# Patient Record
Sex: Female | Born: 1945 | Race: White | Hispanic: No | Smoking: Former smoker
Health system: Southern US, Community
[De-identification: ages and names within clinical notes are randomized; demographics above are authoritative.]

## PROBLEM LIST (undated history)

## (undated) DIAGNOSIS — F329 Major depressive disorder, single episode, unspecified: Secondary | ICD-10-CM

## (undated) DIAGNOSIS — D693 Immune thrombocytopenic purpura: Secondary | ICD-10-CM

## (undated) DIAGNOSIS — N200 Calculus of kidney: Secondary | ICD-10-CM

## (undated) DIAGNOSIS — F32A Depression, unspecified: Secondary | ICD-10-CM

## (undated) DIAGNOSIS — K219 Gastro-esophageal reflux disease without esophagitis: Secondary | ICD-10-CM

## (undated) DIAGNOSIS — J449 Chronic obstructive pulmonary disease, unspecified: Secondary | ICD-10-CM

## (undated) DIAGNOSIS — E119 Type 2 diabetes mellitus without complications: Secondary | ICD-10-CM

## (undated) DIAGNOSIS — E039 Hypothyroidism, unspecified: Secondary | ICD-10-CM

## (undated) DIAGNOSIS — K7460 Unspecified cirrhosis of liver: Secondary | ICD-10-CM

## (undated) HISTORY — PX: CATARACT EXTRACTION: SUR2

## (undated) HISTORY — PX: LUMBAR DISC SURGERY: SHX700

## (undated) HISTORY — DX: Type 2 diabetes mellitus without complications: E11.9

## (undated) HISTORY — DX: Major depressive disorder, single episode, unspecified: F32.9

## (undated) HISTORY — DX: Calculus of kidney: N20.0

## (undated) HISTORY — PX: TONSILLECTOMY AND ADENOIDECTOMY: SUR1326

## (undated) HISTORY — DX: Unspecified cirrhosis of liver: K74.60

## (undated) HISTORY — DX: Hypothyroidism, unspecified: E03.9

## (undated) HISTORY — DX: Depression, unspecified: F32.A

## (undated) HISTORY — DX: Chronic obstructive pulmonary disease, unspecified: J44.9

## (undated) HISTORY — DX: Immune thrombocytopenic purpura: D69.3

## (undated) HISTORY — DX: Gastro-esophageal reflux disease without esophagitis: K21.9

---

## 2006-04-10 ENCOUNTER — Ambulatory Visit: Payer: Self-pay | Admitting: Internal Medicine

## 2008-01-03 ENCOUNTER — Ambulatory Visit: Payer: Self-pay | Admitting: Internal Medicine

## 2009-01-22 ENCOUNTER — Ambulatory Visit: Payer: Self-pay | Admitting: Internal Medicine

## 2009-08-02 ENCOUNTER — Emergency Department: Payer: Self-pay | Admitting: Unknown Physician Specialty

## 2009-08-05 ENCOUNTER — Inpatient Hospital Stay: Payer: Self-pay | Admitting: Internal Medicine

## 2010-02-18 ENCOUNTER — Ambulatory Visit: Payer: Self-pay | Admitting: Internal Medicine

## 2011-03-10 ENCOUNTER — Ambulatory Visit: Payer: Self-pay | Admitting: Internal Medicine

## 2011-03-22 ENCOUNTER — Encounter: Payer: Self-pay | Admitting: Specialist

## 2011-04-02 ENCOUNTER — Encounter: Payer: Self-pay | Admitting: Specialist

## 2011-05-02 ENCOUNTER — Encounter: Payer: Self-pay | Admitting: Specialist

## 2011-06-02 ENCOUNTER — Encounter: Payer: Self-pay | Admitting: Specialist

## 2011-07-02 ENCOUNTER — Encounter: Payer: Self-pay | Admitting: Specialist

## 2012-06-06 ENCOUNTER — Ambulatory Visit: Payer: Self-pay | Admitting: Internal Medicine

## 2013-12-24 DIAGNOSIS — E785 Hyperlipidemia, unspecified: Secondary | ICD-10-CM | POA: Insufficient documentation

## 2013-12-24 DIAGNOSIS — E039 Hypothyroidism, unspecified: Secondary | ICD-10-CM | POA: Insufficient documentation

## 2013-12-24 DIAGNOSIS — J432 Centrilobular emphysema: Secondary | ICD-10-CM | POA: Insufficient documentation

## 2013-12-24 DIAGNOSIS — IMO0001 Reserved for inherently not codable concepts without codable children: Secondary | ICD-10-CM | POA: Insufficient documentation

## 2013-12-24 DIAGNOSIS — E1165 Type 2 diabetes mellitus with hyperglycemia: Secondary | ICD-10-CM

## 2014-05-22 ENCOUNTER — Inpatient Hospital Stay: Payer: Self-pay | Admitting: Internal Medicine

## 2014-05-22 LAB — COMPREHENSIVE METABOLIC PANEL
ALT: 20 U/L
Albumin: 3.1 g/dL — ABNORMAL LOW (ref 3.4–5.0)
Alkaline Phosphatase: 80 U/L
Anion Gap: 2 — ABNORMAL LOW (ref 7–16)
BUN: 25 mg/dL — AB (ref 7–18)
Bilirubin,Total: 0.7 mg/dL (ref 0.2–1.0)
CALCIUM: 8.6 mg/dL (ref 8.5–10.1)
CHLORIDE: 105 mmol/L (ref 98–107)
CO2: 30 mmol/L (ref 21–32)
Creatinine: 0.66 mg/dL (ref 0.60–1.30)
GLUCOSE: 258 mg/dL — AB (ref 65–99)
Osmolality: 287 (ref 275–301)
POTASSIUM: 4.4 mmol/L (ref 3.5–5.1)
SGOT(AST): 25 U/L (ref 15–37)
SODIUM: 137 mmol/L (ref 136–145)
TOTAL PROTEIN: 6.4 g/dL (ref 6.4–8.2)

## 2014-05-22 LAB — CBC WITH DIFFERENTIAL/PLATELET
BASOS PCT: 0.8 %
Basophil #: 0.1 10*3/uL (ref 0.0–0.1)
EOS PCT: 1.6 %
Eosinophil #: 0.2 10*3/uL (ref 0.0–0.7)
HCT: 35.6 % (ref 35.0–47.0)
HGB: 10.8 g/dL — ABNORMAL LOW (ref 12.0–16.0)
Lymphocyte #: 0.9 10*3/uL — ABNORMAL LOW (ref 1.0–3.6)
Lymphocyte %: 7.4 %
MCH: 23 pg — AB (ref 26.0–34.0)
MCHC: 30.3 g/dL — ABNORMAL LOW (ref 32.0–36.0)
MCV: 76 fL — AB (ref 80–100)
MONO ABS: 0.5 x10 3/mm (ref 0.2–0.9)
MONOS PCT: 4.6 %
Neutrophil #: 10.3 10*3/uL — ABNORMAL HIGH (ref 1.4–6.5)
Neutrophil %: 85.6 %
Platelet: 103 10*3/uL — ABNORMAL LOW (ref 150–440)
RBC: 4.69 10*6/uL (ref 3.80–5.20)
RDW: 17.5 % — ABNORMAL HIGH (ref 11.5–14.5)
WBC: 12 10*3/uL — ABNORMAL HIGH (ref 3.6–11.0)

## 2014-05-22 LAB — APTT: Activated PTT: 26.5 secs (ref 23.6–35.9)

## 2014-05-22 LAB — HEMOGLOBIN: HGB: 9.4 g/dL — ABNORMAL LOW (ref 12.0–16.0)

## 2014-05-22 LAB — PROTIME-INR
INR: 1.3
Prothrombin Time: 15.6 secs — ABNORMAL HIGH (ref 11.5–14.7)

## 2014-05-23 LAB — BASIC METABOLIC PANEL
Anion Gap: 7 (ref 7–16)
BUN: 21 mg/dL — AB (ref 7–18)
CREATININE: 0.68 mg/dL (ref 0.60–1.30)
Calcium, Total: 8 mg/dL — ABNORMAL LOW (ref 8.5–10.1)
Chloride: 111 mmol/L — ABNORMAL HIGH (ref 98–107)
Co2: 29 mmol/L (ref 21–32)
EGFR (African American): >60
GLUCOSE: 107 mg/dL — AB (ref 65–99)
Osmolality: 296 (ref 275–301)
POTASSIUM: 4 mmol/L (ref 3.5–5.1)
Sodium: 147 mmol/L — ABNORMAL HIGH (ref 136–145)

## 2014-05-23 LAB — CBC WITH DIFFERENTIAL/PLATELET
BASOS ABS: 0 10*3/uL (ref 0.0–0.1)
Basophil %: 0.5 %
Eosinophil #: 0.1 10*3/uL (ref 0.0–0.7)
Eosinophil %: 2.5 %
HCT: 27 % — ABNORMAL LOW (ref 35.0–47.0)
HGB: 8.3 g/dL — ABNORMAL LOW (ref 12.0–16.0)
LYMPHS ABS: 1 10*3/uL (ref 1.0–3.6)
Lymphocyte %: 20.9 %
MCH: 22.9 pg — ABNORMAL LOW (ref 26.0–34.0)
MCHC: 30.7 g/dL — ABNORMAL LOW (ref 32.0–36.0)
MCV: 75 fL — ABNORMAL LOW (ref 80–100)
MONOS PCT: 8 %
Monocyte #: 0.4 x10 3/mm (ref 0.2–0.9)
NEUTROS ABS: 3.2 10*3/uL (ref 1.4–6.5)
Neutrophil %: 68.1 %
Platelet: 56 10*3/uL — ABNORMAL LOW (ref 150–440)
RBC: 3.62 10*6/uL — AB (ref 3.80–5.20)
RDW: 17.2 % — ABNORMAL HIGH (ref 11.5–14.5)
WBC: 4.7 10*3/uL (ref 3.6–11.0)

## 2014-05-23 LAB — HEMOGLOBIN
HGB: 7.9 g/dL — AB (ref 12.0–16.0)
HGB: 8.7 g/dL — ABNORMAL LOW (ref 12.0–16.0)

## 2014-05-24 LAB — HEMOGLOBIN
HGB: 7.5 g/dL — ABNORMAL LOW (ref 12.0–16.0)
HGB: 8.4 g/dL — ABNORMAL LOW (ref 12.0–16.0)

## 2014-05-25 LAB — HEMOGLOBIN: HGB: 7.3 g/dL — ABNORMAL LOW (ref 12.0–16.0)

## 2014-05-26 LAB — CREATININE, SERUM
Creatinine: 0.84 mg/dL (ref 0.60–1.30)
EGFR (African American): >60

## 2014-05-26 LAB — AFP TUMOR MARKER: AFP-Tumor Marker: 3.4 ng/mL (ref 0.0–8.3)

## 2014-05-26 LAB — HEMOGLOBIN: HGB: 8.5 g/dL — ABNORMAL LOW (ref 12.0–16.0)

## 2014-05-28 ENCOUNTER — Inpatient Hospital Stay: Payer: Self-pay | Admitting: Internal Medicine

## 2014-05-28 LAB — CBC
HCT: 33.2 % — ABNORMAL LOW (ref 35.0–47.0)
HGB: 10.2 g/dL — ABNORMAL LOW (ref 12.0–16.0)
MCH: 24 pg — AB (ref 26.0–34.0)
MCHC: 30.8 g/dL — AB (ref 32.0–36.0)
MCV: 78 fL — ABNORMAL LOW (ref 80–100)
PLATELETS: 127 10*3/uL — AB (ref 150–440)
RBC: 4.26 10*6/uL (ref 3.80–5.20)
RDW: 19 % — ABNORMAL HIGH (ref 11.5–14.5)
WBC: 15.2 10*3/uL — ABNORMAL HIGH (ref 3.6–11.0)

## 2014-05-28 LAB — BASIC METABOLIC PANEL
Anion Gap: 10 (ref 7–16)
BUN: 20 mg/dL — AB (ref 7–18)
CALCIUM: 8.4 mg/dL — AB (ref 8.5–10.1)
Chloride: 102 mmol/L (ref 98–107)
Co2: 29 mmol/L (ref 21–32)
Creatinine: 0.81 mg/dL (ref 0.60–1.30)
EGFR (African American): >60
EGFR (Non-African Amer.): >60
Glucose: 210 mg/dL — ABNORMAL HIGH (ref 65–99)
Osmolality: 290 (ref 275–301)
POTASSIUM: 4.2 mmol/L (ref 3.5–5.1)
Sodium: 141 mmol/L (ref 136–145)

## 2014-05-28 LAB — TROPONIN I: Troponin-I: <0.02 ng/mL

## 2014-05-29 LAB — CBC WITH DIFFERENTIAL/PLATELET
Basophil #: 0 10*3/uL (ref 0.0–0.1)
Basophil %: 0 %
EOS PCT: 0 %
Eosinophil #: 0 10*3/uL (ref 0.0–0.7)
HCT: 28.2 % — AB (ref 35.0–47.0)
HGB: 8.7 g/dL — ABNORMAL LOW (ref 12.0–16.0)
LYMPHS PCT: 6.2 %
Lymphocyte #: 0.2 10*3/uL — ABNORMAL LOW (ref 1.0–3.6)
MCH: 24.6 pg — ABNORMAL LOW (ref 26.0–34.0)
MCHC: 30.9 g/dL — ABNORMAL LOW (ref 32.0–36.0)
MCV: 80 fL (ref 80–100)
Monocyte #: 0.1 x10 3/mm — ABNORMAL LOW (ref 0.2–0.9)
Monocyte %: 3.1 %
Neutrophil #: 2.8 10*3/uL (ref 1.4–6.5)
Neutrophil %: 90.7 %
Platelet: 57 10*3/uL — ABNORMAL LOW (ref 150–440)
RBC: 3.54 10*6/uL — AB (ref 3.80–5.20)
RDW: 18.9 % — AB (ref 11.5–14.5)
WBC: 3.1 10*3/uL — AB (ref 3.6–11.0)

## 2014-05-29 LAB — BASIC METABOLIC PANEL
Anion Gap: 7 (ref 7–16)
BUN: 23 mg/dL — ABNORMAL HIGH (ref 7–18)
Calcium, Total: 8.2 mg/dL — ABNORMAL LOW (ref 8.5–10.1)
Chloride: 99 mmol/L (ref 98–107)
Co2: 33 mmol/L — ABNORMAL HIGH (ref 21–32)
Creatinine: 0.87 mg/dL (ref 0.60–1.30)
EGFR (African American): >60
EGFR (Non-African Amer.): >60
GLUCOSE: 332 mg/dL — AB (ref 65–99)
OSMOLALITY: 294 (ref 275–301)
Potassium: 4.4 mmol/L (ref 3.5–5.1)
Sodium: 139 mmol/L (ref 136–145)

## 2014-05-30 LAB — CBC WITH DIFFERENTIAL/PLATELET
BASOS ABS: 0 10*3/uL (ref 0.0–0.1)
Basophil %: 0.2 %
EOS ABS: 0 10*3/uL (ref 0.0–0.7)
EOS PCT: 0.1 %
HCT: 27.6 % — AB (ref 35.0–47.0)
HGB: 8.8 g/dL — ABNORMAL LOW (ref 12.0–16.0)
LYMPHS ABS: 0.2 10*3/uL — AB (ref 1.0–3.6)
LYMPHS PCT: 5.5 %
MCH: 24.6 pg — ABNORMAL LOW (ref 26.0–34.0)
MCHC: 31.9 g/dL — ABNORMAL LOW (ref 32.0–36.0)
MCV: 77 fL — ABNORMAL LOW (ref 80–100)
MONOS PCT: 3.8 %
Monocyte #: 0.1 x10 3/mm — ABNORMAL LOW (ref 0.2–0.9)
Neutrophil #: 2.9 10*3/uL (ref 1.4–6.5)
Neutrophil %: 90.4 %
PLATELETS: 57 10*3/uL — AB (ref 150–440)
RBC: 3.58 10*6/uL — AB (ref 3.80–5.20)
RDW: 19.3 % — ABNORMAL HIGH (ref 11.5–14.5)
WBC: 3.3 10*3/uL — ABNORMAL LOW (ref 3.6–11.0)

## 2014-06-01 ENCOUNTER — Ambulatory Visit: Payer: Self-pay | Admitting: Oncology

## 2014-06-06 DIAGNOSIS — I8511 Secondary esophageal varices with bleeding: Secondary | ICD-10-CM | POA: Insufficient documentation

## 2014-06-16 ENCOUNTER — Observation Stay: Payer: Self-pay | Admitting: Internal Medicine

## 2014-06-16 LAB — COMPREHENSIVE METABOLIC PANEL
ALK PHOS: 77 U/L
ALT: 34 U/L
ANION GAP: 5 — AB (ref 7–16)
Albumin: 2.9 g/dL — ABNORMAL LOW (ref 3.4–5.0)
BUN: 7 mg/dL (ref 7–18)
Bilirubin,Total: 0.9 mg/dL (ref 0.2–1.0)
CALCIUM: 8.4 mg/dL — AB (ref 8.5–10.1)
CREATININE: 0.75 mg/dL (ref 0.60–1.30)
Chloride: 99 mmol/L (ref 98–107)
Co2: 39 mmol/L — ABNORMAL HIGH (ref 21–32)
EGFR (African American): >60
GLUCOSE: 272 mg/dL — AB (ref 65–99)
Osmolality: 293 (ref 275–301)
POTASSIUM: 3.7 mmol/L (ref 3.5–5.1)
SGOT(AST): 27 U/L (ref 15–37)
Sodium: 143 mmol/L (ref 136–145)
TOTAL PROTEIN: 5.7 g/dL — AB (ref 6.4–8.2)

## 2014-06-16 LAB — CBC WITH DIFFERENTIAL/PLATELET
Basophil #: 0 10*3/uL (ref 0.0–0.1)
Basophil %: 0.7 %
EOS PCT: 2.4 %
Eosinophil #: 0.1 10*3/uL (ref 0.0–0.7)
HCT: 30.3 % — AB (ref 35.0–47.0)
HGB: 9.4 g/dL — AB (ref 12.0–16.0)
LYMPHS ABS: 0.4 10*3/uL — AB (ref 1.0–3.6)
Lymphocyte %: 13.5 %
MCH: 24.8 pg — ABNORMAL LOW (ref 26.0–34.0)
MCHC: 31.1 g/dL — AB (ref 32.0–36.0)
MCV: 80 fL (ref 80–100)
MONO ABS: 0.2 x10 3/mm (ref 0.2–0.9)
MONOS PCT: 8.8 %
Neutrophil #: 2 10*3/uL (ref 1.4–6.5)
Neutrophil %: 74.6 %
Platelet: 31 10*3/uL — ABNORMAL LOW (ref 150–440)
RBC: 3.81 10*6/uL (ref 3.80–5.20)
RDW: 20 % — AB (ref 11.5–14.5)
WBC: 2.6 10*3/uL — AB (ref 3.6–11.0)

## 2014-06-16 LAB — CBC
HCT: 33.2 % — ABNORMAL LOW (ref 35.0–47.0)
HGB: 10.2 g/dL — AB (ref 12.0–16.0)
MCH: 24.8 pg — AB (ref 26.0–34.0)
MCHC: 30.7 g/dL — AB (ref 32.0–36.0)
MCV: 81 fL (ref 80–100)
Platelet: 44 10*3/uL — ABNORMAL LOW (ref 150–440)
RBC: 4.11 10*6/uL (ref 3.80–5.20)
RDW: 20.5 % — ABNORMAL HIGH (ref 11.5–14.5)
WBC: 4.8 10*3/uL (ref 3.6–11.0)

## 2014-06-16 LAB — PROTIME-INR
INR: 1.2
PROTHROMBIN TIME: 14.9 s — AB (ref 11.5–14.7)

## 2014-06-17 LAB — CBC WITH DIFFERENTIAL/PLATELET
BASOS ABS: 0 10*3/uL (ref 0.0–0.1)
Basophil %: 0.8 %
EOS ABS: 0.1 10*3/uL (ref 0.0–0.7)
Eosinophil %: 2.3 %
HCT: 30.2 % — ABNORMAL LOW (ref 35.0–47.0)
HGB: 9.2 g/dL — ABNORMAL LOW (ref 12.0–16.0)
LYMPHS PCT: 12.1 %
Lymphocyte #: 0.4 10*3/uL — ABNORMAL LOW (ref 1.0–3.6)
MCH: 24.6 pg — ABNORMAL LOW (ref 26.0–34.0)
MCHC: 30.4 g/dL — ABNORMAL LOW (ref 32.0–36.0)
MCV: 81 fL (ref 80–100)
Monocyte #: 0.3 x10 3/mm (ref 0.2–0.9)
Monocyte %: 7.9 %
NEUTROS PCT: 76.9 %
Neutrophil #: 2.5 10*3/uL (ref 1.4–6.5)
Platelet: 34 10*3/uL — ABNORMAL LOW (ref 150–440)
RBC: 3.73 10*6/uL — ABNORMAL LOW (ref 3.80–5.20)
RDW: 20 % — AB (ref 11.5–14.5)
WBC: 3.2 10*3/uL — ABNORMAL LOW (ref 3.6–11.0)

## 2014-06-17 LAB — BASIC METABOLIC PANEL
ANION GAP: 3 — AB (ref 7–16)
BUN: 7 mg/dL (ref 7–18)
Calcium, Total: 7.8 mg/dL — ABNORMAL LOW (ref 8.5–10.1)
Chloride: 102 mmol/L (ref 98–107)
Co2: 39 mmol/L — ABNORMAL HIGH (ref 21–32)
Creatinine: 0.74 mg/dL (ref 0.60–1.30)
EGFR (African American): >60
Glucose: 144 mg/dL — ABNORMAL HIGH (ref 65–99)
Osmolality: 287 (ref 275–301)
Potassium: 4.1 mmol/L (ref 3.5–5.1)
Sodium: 144 mmol/L (ref 136–145)

## 2014-06-25 ENCOUNTER — Inpatient Hospital Stay: Payer: Self-pay | Admitting: Internal Medicine

## 2014-06-25 LAB — CBC
HCT: 35.2 % (ref 35.0–47.0)
HGB: 10.5 g/dL — ABNORMAL LOW (ref 12.0–16.0)
MCH: 24.6 pg — AB (ref 26.0–34.0)
MCHC: 29.9 g/dL — AB (ref 32.0–36.0)
MCV: 82 fL (ref 80–100)
PLATELETS: 85 10*3/uL — AB (ref 150–440)
RBC: 4.29 10*6/uL (ref 3.80–5.20)
RDW: 19.7 % — AB (ref 11.5–14.5)
WBC: 2.9 10*3/uL — ABNORMAL LOW (ref 3.6–11.0)

## 2014-06-25 LAB — BASIC METABOLIC PANEL
Anion Gap: 6 — ABNORMAL LOW (ref 7–16)
BUN: 11 mg/dL (ref 4–21)
BUN: 11 mg/dL (ref 7–18)
CO2: 37 mmol/L — AB (ref 21–32)
Calcium, Total: 8.3 mg/dL — ABNORMAL LOW (ref 8.5–10.1)
Chloride: 100 mmol/L (ref 98–107)
Creatinine: 0.56 mg/dL — ABNORMAL LOW (ref 0.60–1.30)
Creatinine: 0.6 mg/dL (ref 0.5–1.1)
EGFR (Non-African Amer.): >60
Glucose: 258 mg/dL
Glucose: 258 mg/dL — ABNORMAL HIGH (ref 65–99)
Osmolality: 293 (ref 275–301)
Potassium: 4.4 mmol/L (ref 3.4–5.3)
Potassium: 4.4 mmol/L (ref 3.5–5.1)
SODIUM: 143 mmol/L (ref 137–147)
Sodium: 143 mmol/L (ref 136–145)

## 2014-06-25 LAB — TROPONIN I: Troponin-I: <0.02 ng/mL

## 2014-06-25 LAB — CBC AND DIFFERENTIAL
HCT: 35 % — AB (ref 36–46)
HEMOGLOBIN: 10.5 g/dL — AB (ref 12.0–16.0)
Platelets: 85 10*3/uL — AB (ref 150–399)
WBC: 2.9 10*3/mL

## 2014-06-26 LAB — BASIC METABOLIC PANEL
Anion Gap: 3 — ABNORMAL LOW (ref 7–16)
BUN: 12 mg/dL (ref 7–18)
CALCIUM: 8.3 mg/dL — AB (ref 8.5–10.1)
Chloride: 99 mmol/L (ref 98–107)
Co2: 42 mmol/L (ref 21–32)
Creatinine: 0.69 mg/dL (ref 0.60–1.30)
GLUCOSE: 246 mg/dL — AB (ref 65–99)
Osmolality: 295 (ref 275–301)
Potassium: 4 mmol/L (ref 3.5–5.1)
Sodium: 144 mmol/L (ref 136–145)

## 2014-06-26 LAB — CBC WITH DIFFERENTIAL/PLATELET
Basophil #: 0 10*3/uL (ref 0.0–0.1)
Basophil %: 0.2 %
EOS ABS: 0 10*3/uL (ref 0.0–0.7)
EOS PCT: 0.2 %
HCT: 32.5 % — AB (ref 35.0–47.0)
HGB: 9.9 g/dL — ABNORMAL LOW (ref 12.0–16.0)
Lymphocyte #: 0.2 10*3/uL — ABNORMAL LOW (ref 1.0–3.6)
Lymphocyte %: 11.4 %
MCH: 24.6 pg — ABNORMAL LOW (ref 26.0–34.0)
MCHC: 30.5 g/dL — AB (ref 32.0–36.0)
MCV: 81 fL (ref 80–100)
Monocyte #: 0.1 x10 3/mm — ABNORMAL LOW (ref 0.2–0.9)
Monocyte %: 6.1 %
NEUTROS ABS: 1.2 10*3/uL — AB (ref 1.4–6.5)
Neutrophil %: 82.1 %
Platelet: 59 10*3/uL — ABNORMAL LOW (ref 150–440)
RBC: 4.04 10*6/uL (ref 3.80–5.20)
RDW: 19.3 % — ABNORMAL HIGH (ref 11.5–14.5)
WBC: 1.4 10*3/uL — AB (ref 3.6–11.0)

## 2014-06-26 LAB — IRON AND TIBC
IRON BIND. CAP.(TOTAL): 294 ug/dL (ref 250–450)
IRON: 17 ug/dL — AB (ref 50–170)
Iron Saturation: 6 %
UNBOUND IRON-BIND. CAP.: 277 ug/dL

## 2014-06-26 LAB — FERRITIN: Ferritin (ARMC): 33 ng/mL (ref 8–388)

## 2014-06-26 LAB — FOLATE: Folic Acid: 8.3 ng/mL (ref 3.1–100.0)

## 2014-06-26 LAB — LACTATE DEHYDROGENASE: LDH: 231 U/L (ref 81–246)

## 2014-06-27 LAB — CBC WITH DIFFERENTIAL/PLATELET
BASOS PCT: 0.1 %
Basophil #: 0 10*3/uL (ref 0.0–0.1)
Eosinophil #: 0 10*3/uL (ref 0.0–0.7)
Eosinophil %: 0.1 %
HCT: 32.5 % — AB (ref 35.0–47.0)
HGB: 10 g/dL — AB (ref 12.0–16.0)
LYMPHS ABS: 0.2 10*3/uL — AB (ref 1.0–3.6)
Lymphocyte %: 5.6 %
MCH: 24.6 pg — ABNORMAL LOW (ref 26.0–34.0)
MCHC: 30.7 g/dL — ABNORMAL LOW (ref 32.0–36.0)
MCV: 80 fL (ref 80–100)
MONO ABS: 0.2 x10 3/mm (ref 0.2–0.9)
Monocyte %: 5.6 %
NEUTROS PCT: 88.6 %
Neutrophil #: 2.7 10*3/uL (ref 1.4–6.5)
Platelet: 65 10*3/uL — ABNORMAL LOW (ref 150–440)
RBC: 4.06 10*6/uL (ref 3.80–5.20)
RDW: 19.4 % — ABNORMAL HIGH (ref 11.5–14.5)
WBC: 3 10*3/uL — AB (ref 3.6–11.0)

## 2014-06-28 LAB — CBC WITH DIFFERENTIAL/PLATELET
Basophil #: 0 10*3/uL (ref 0.0–0.1)
Basophil %: 0.1 %
EOS PCT: 0 %
Eosinophil #: 0 10*3/uL (ref 0.0–0.7)
HCT: 32.4 % — ABNORMAL LOW (ref 35.0–47.0)
HGB: 9.9 g/dL — AB (ref 12.0–16.0)
Lymphocyte #: 0.1 10*3/uL — ABNORMAL LOW (ref 1.0–3.6)
Lymphocyte %: 4.9 %
MCH: 24.4 pg — AB (ref 26.0–34.0)
MCHC: 30.5 g/dL — AB (ref 32.0–36.0)
MCV: 80 fL (ref 80–100)
MONO ABS: 0.2 x10 3/mm (ref 0.2–0.9)
MONOS PCT: 6.4 %
NEUTROS ABS: 2.4 10*3/uL (ref 1.4–6.5)
NEUTROS PCT: 88.6 %
Platelet: 64 10*3/uL — ABNORMAL LOW (ref 150–440)
RBC: 4.04 10*6/uL (ref 3.80–5.20)
RDW: 19.3 % — ABNORMAL HIGH (ref 11.5–14.5)
WBC: 2.7 10*3/uL — AB (ref 3.6–11.0)

## 2014-06-29 DIAGNOSIS — I361 Nonrheumatic tricuspid (valve) insufficiency: Secondary | ICD-10-CM

## 2014-06-29 LAB — CBC WITH DIFFERENTIAL/PLATELET
BASOS ABS: 0 10*3/uL (ref 0.0–0.1)
BASOS PCT: 0.1 %
EOS ABS: 0 10*3/uL (ref 0.0–0.7)
Eosinophil %: 0 %
HCT: 32 % — ABNORMAL LOW (ref 35.0–47.0)
HGB: 9.9 g/dL — ABNORMAL LOW (ref 12.0–16.0)
LYMPHS PCT: 3.7 %
Lymphocyte #: 0.1 10*3/uL — ABNORMAL LOW (ref 1.0–3.6)
MCH: 24.7 pg — ABNORMAL LOW (ref 26.0–34.0)
MCHC: 31 g/dL — ABNORMAL LOW (ref 32.0–36.0)
MCV: 80 fL (ref 80–100)
Monocyte #: 0.2 x10 3/mm (ref 0.2–0.9)
Monocyte %: 7.1 %
NEUTROS PCT: 89.1 %
Neutrophil #: 2.3 10*3/uL (ref 1.4–6.5)
Platelet: 56 10*3/uL — ABNORMAL LOW (ref 150–440)
RBC: 4.01 10*6/uL (ref 3.80–5.20)
RDW: 19.4 % — AB (ref 11.5–14.5)
WBC: 2.6 10*3/uL — ABNORMAL LOW (ref 3.6–11.0)

## 2014-07-01 ENCOUNTER — Ambulatory Visit: Payer: Self-pay | Admitting: Oncology

## 2014-07-01 LAB — COMPREHENSIVE METABOLIC PANEL
ALK PHOS: 57 U/L
AST: 26 U/L (ref 15–37)
Albumin: 2.6 g/dL — ABNORMAL LOW (ref 3.4–5.0)
Anion Gap: 5 — ABNORMAL LOW (ref 7–16)
BILIRUBIN TOTAL: 0.4 mg/dL (ref 0.2–1.0)
BUN: 19 mg/dL — ABNORMAL HIGH (ref 7–18)
CHLORIDE: 95 mmol/L — AB (ref 98–107)
Calcium, Total: 8.5 mg/dL (ref 8.5–10.1)
Co2: 42 mmol/L (ref 21–32)
Creatinine: 0.64 mg/dL (ref 0.60–1.30)
Glucose: 167 mg/dL — ABNORMAL HIGH (ref 65–99)
Osmolality: 289 (ref 275–301)
Potassium: 4 mmol/L (ref 3.5–5.1)
SGPT (ALT): 27 U/L
Sodium: 142 mmol/L (ref 136–145)
Total Protein: 5.1 g/dL — ABNORMAL LOW (ref 6.4–8.2)

## 2014-07-01 LAB — CBC WITH DIFFERENTIAL/PLATELET
Basophil #: 0 10*3/uL (ref 0.0–0.1)
Basophil %: 0 %
EOS ABS: 0 10*3/uL (ref 0.0–0.7)
EOS PCT: 0 %
HCT: 31.8 % — ABNORMAL LOW (ref 35.0–47.0)
HGB: 9.8 g/dL — ABNORMAL LOW (ref 12.0–16.0)
Lymphocyte #: 0.1 10*3/uL — ABNORMAL LOW (ref 1.0–3.6)
Lymphocyte %: 3.2 %
MCH: 24.6 pg — ABNORMAL LOW (ref 26.0–34.0)
MCHC: 30.9 g/dL — ABNORMAL LOW (ref 32.0–36.0)
MCV: 80 fL (ref 80–100)
MONO ABS: 0.3 x10 3/mm (ref 0.2–0.9)
Monocyte %: 8.6 %
Neutrophil #: 2.9 10*3/uL (ref 1.4–6.5)
Neutrophil %: 88.2 %
Platelet: 50 10*3/uL — ABNORMAL LOW (ref 150–440)
RBC: 3.99 10*6/uL (ref 3.80–5.20)
RDW: 18.9 % — ABNORMAL HIGH (ref 11.5–14.5)
WBC: 3.3 10*3/uL — ABNORMAL LOW (ref 3.6–11.0)

## 2014-07-01 LAB — PROT IMMUNOELECTROPHORES(ARMC)

## 2014-07-02 LAB — BASIC METABOLIC PANEL
Anion Gap: 2 — ABNORMAL LOW (ref 7–16)
BUN: 20 mg/dL — ABNORMAL HIGH (ref 7–18)
CALCIUM: 8.7 mg/dL (ref 8.5–10.1)
CO2: 41 mmol/L — AB (ref 21–32)
Chloride: 95 mmol/L — ABNORMAL LOW (ref 98–107)
Creatinine: 0.72 mg/dL (ref 0.60–1.30)
Glucose: 196 mg/dL — ABNORMAL HIGH (ref 65–99)
OSMOLALITY: 284 (ref 275–301)
Potassium: 3.9 mmol/L (ref 3.5–5.1)
Sodium: 138 mmol/L (ref 136–145)

## 2014-07-03 ENCOUNTER — Encounter: Payer: Self-pay | Admitting: Internal Medicine

## 2014-07-03 ENCOUNTER — Non-Acute Institutional Stay (SKILLED_NURSING_FACILITY): Payer: Medicare Other | Admitting: Internal Medicine

## 2014-07-03 DIAGNOSIS — I5041 Acute combined systolic (congestive) and diastolic (congestive) heart failure: Secondary | ICD-10-CM

## 2014-07-03 DIAGNOSIS — I10 Essential (primary) hypertension: Secondary | ICD-10-CM

## 2014-07-03 DIAGNOSIS — B370 Candidal stomatitis: Secondary | ICD-10-CM

## 2014-07-03 DIAGNOSIS — K7460 Unspecified cirrhosis of liver: Secondary | ICD-10-CM

## 2014-07-03 DIAGNOSIS — F32A Depression, unspecified: Secondary | ICD-10-CM

## 2014-07-03 DIAGNOSIS — I8510 Secondary esophageal varices without bleeding: Secondary | ICD-10-CM

## 2014-07-03 DIAGNOSIS — E118 Type 2 diabetes mellitus with unspecified complications: Secondary | ICD-10-CM

## 2014-07-03 DIAGNOSIS — J441 Chronic obstructive pulmonary disease with (acute) exacerbation: Secondary | ICD-10-CM

## 2014-07-03 DIAGNOSIS — D509 Iron deficiency anemia, unspecified: Secondary | ICD-10-CM

## 2014-07-03 DIAGNOSIS — F329 Major depressive disorder, single episode, unspecified: Secondary | ICD-10-CM | POA: Insufficient documentation

## 2014-07-03 DIAGNOSIS — R5381 Other malaise: Secondary | ICD-10-CM | POA: Insufficient documentation

## 2014-07-03 DIAGNOSIS — R188 Other ascites: Secondary | ICD-10-CM

## 2014-07-03 NOTE — Progress Notes (Signed)
Patient ID: Angela Austin, female   DOB: Jan 03, 1946, [age] y.o.   MRN: [id number]     Facility: [facility]    PCP: No primary care provider on file.  Code Status: full code  Allergies  Allergen Reactions  . Codeine   . Erythromycin   . Morphine And Related   . Spiriva Handihaler [Tiotropium Bromide Monohydrate]   . Tetracyclines & Related     Chief Complaint  Patient presents with  . New Admit To SNF     HPI:  68y/o female patient is here for STR after hospital admission from 06/25/14-07/02/14 with acute respiratory failure in setting of copd and chf exacerbation with possible hepatorenal syndrome. She responded well to diuresis and prednsione. She was started on doxycycline for possible pneumonia. She was also started on spironolactone with her ascites in setting of liver cirrhosis. She is here with physical deconditioning for rehabilitation. She is seen in her room today. She is in no distress. She feels weak and gets tired easily but overall feels has made improvement in her strength since admission to the hospital. She is on o2 and feels her breathing to be comfortable. She feels soreness in her mouth and mentions nystatin has helped her in the past.   Review of Systems:  Constitutional: Negative for fever, chills, diaphoresis.  HENT: Negative for congestion, hearing loss, earache and sore throat.   Eyes: Negative for eye pain, blurred vision, double vision and discharge.  Respiratory: Negative for shortness of breath and wheezing.  has cough Cardiovascular: Negative for chest pain, palpitations, leg swelling.  Gastrointestinal: Negative for heartburn, nausea, vomiting, abdominal pain. Had bowel movement yesterday Genitourinary: Negative for dysuria Musculoskeletal: Negative for back pain, falls Skin: Negative for itching, rash.  Neurological: Negative for dizziness, tingling, focal weakness and headaches.  Psychiatric/Behavioral: Negative for  depression, insomnia and memory loss. The patient is not nervous/anxious.    PMH DM, hypothyroidism, depression, hf, copd on o2, HTN  Social history Smoker in past, quit many years bacl, no alcohol use, lives with her son and nephew, denies illict drug use  Family history Dementia and bone cancer in father,  CAD in mother  Medications: Patient's Medications  New Prescriptions   No medications on file  Previous Medications   ALBUTEROL (ACCUNEB) 1.25 MG/3ML NEBULIZER SOLUTION    Take 1 ampule by nebulization every 6 (six) hours as needed for wheezing.   AMLODIPINE (NORVASC) 5 MG TABLET    Take 5 mg by mouth daily.   DOXYCYCLINE (DORYX) 100 MG DR CAPSULE    Take 100 mg by mouth 2 (two) times daily.   FERROUS SULFATE 325 (65 FE) MG TABLET    Take 325 mg by mouth 2 (two) times daily with a meal.   FLUOXETINE (PROZAC) 20 MG TABLET    Take 20 mg by mouth daily.   FLUTICASONE FUROATE-VILANTEROL (BREO ELLIPTA) 100-25 MCG/INH AEPB    Inhale 1 puff into the lungs daily.   FUROSEMIDE (LASIX) 20 MG TABLET    Take 20 mg by mouth 2 (two) times daily.   GLYBURIDE MICRONIZED (GLYNASE) 6 MG TABLET    Take 6 mg by mouth 2 (two) times daily with a meal.   LEVOTHYROXINE (SYNTHROID, LEVOTHROID) 50 MCG TABLET    Take 50 mcg by mouth daily before breakfast.   LISINOPRIL (PRINIVIL,ZESTRIL) 40 MG TABLET    Take 40 mg by mouth daily.   MORPHINE 10 MG/5ML SOLUTION    Take 2.5 mg by  mouth every 4 (four) hours as needed for severe pain.   NADOLOL (CORGARD) 40 MG TABLET    Take 40 mg by mouth daily.   NYSTATIN (MYCOSTATIN) 100000 UNIT/ML SUSPENSION    Take 5 mLs by mouth 3 (three) times daily as needed.   PANTOPRAZOLE (PROTONIX) 40 MG TABLET    Take 40 mg by mouth 2 (two) times daily.   SPIRONOLACTONE (ALDACTONE) 50 MG TABLET    Take 50 mg by mouth 2 (two) times daily.  Modified Medications   No medications on file  Discontinued Medications   No medications on file     Physical Exam: Filed Vitals:    07/03/14 1230  BP: 136/59  Pulse: 73  Temp: 98.9 F (37.2 C)  Resp: 19  SpO2: 90%    General- elderly female in no acute distress Head- atraumatic, normocephalic Eyes- PERRLA, EOMI, no pallor, no icterus, no discharge Neck- no cervical lymphadenopathy Throat- moist mucus membrane, no mouth sores, oral thrush Cardiovascular- normal s1,s2, no murmurs, palpable dorsalis pedis Respiratory- bilateral poor air entry, no wheeze, no rhonchi, no crackles, no use of accessory muscles Abdomen- bowel sounds present, soft, non tender Musculoskeletal- able to move all 4 extremities, trace leg edema Neurological- no focal deficit Skin- warm and dry Psychiatry- alert and oriented to person, place and time, normal mood and affect   Labs reviewed: Reviewed, see discharge summary  Assessment/Plan  Physical deconditioning Will have her work with physical therapy and occupational therapy team to help with gait training and muscle strengthening exercises.fall precautions. Skin care. Encourage to be out of bed.   chf euvolemic on exam today, continue spironolactone 50 mg bid, lisinopril 40 mg bid and lasix 20 mg daily, monitor daily weight, check bmp  Copd Recent exacerbation, to complete course of tapering prednisone on 07/14/14. Continue o2 with breo ellipta and prn albuterol nebs. Add mucinex 1200 mg bid x 5 days. Continue doxycycline until 07/06/14  Oral thrush Change nystatin to tid from tid prn for now, reassess  Depression Mood stable, continue fluoxetine 20 mg daily  Esophageal varices With liver cirrhosis, continue nadolol 40 mg daily. Continue ppi  gerd Symptom controlled, continue pantoprazole 40 mg bid  Iron def anemia Check cbc, continue ferrous sulfate 325 mg bid  Liver cirrhosis with ascites No signs of encephalopathy, monitor mental status. Continue aldactone and lasix for now. Check bmp. Monitor weight  HTN bp stable, continue amlodipine 5 mg daily with diuretics,  monitor bp  DM On glyburide, continue this, monitor cbg  Family/ staff Communication: reviewed care plan with patient and nursing supervisor   Goals of care: short term rehabilitation    Labs/tests ordered: cbc, cmp in 1 week    Oneal GroutMAHIMA Delsin Copen, MD  [facility] [phone] (Monday-Friday 8 am - 5 pm) [phone] (afterhours)

## 2014-07-10 ENCOUNTER — Non-Acute Institutional Stay (SKILLED_NURSING_FACILITY): Payer: Medicare Other | Admitting: Registered Nurse

## 2014-07-10 DIAGNOSIS — IMO0002 Reserved for concepts with insufficient information to code with codable children: Secondary | ICD-10-CM

## 2014-07-10 DIAGNOSIS — E1165 Type 2 diabetes mellitus with hyperglycemia: Secondary | ICD-10-CM

## 2014-07-10 DIAGNOSIS — R600 Localized edema: Secondary | ICD-10-CM

## 2014-07-10 DIAGNOSIS — R0600 Dyspnea, unspecified: Secondary | ICD-10-CM

## 2014-07-10 DIAGNOSIS — R0689 Other abnormalities of breathing: Secondary | ICD-10-CM

## 2014-07-10 LAB — CBC AND DIFFERENTIAL
HCT: 36 % (ref 36–46)
Hemoglobin: 10.7 g/dL — AB (ref 12.0–16.0)
PLATELETS: 36 10*3/uL — AB (ref 150–399)
WBC: 5.3 10*3/mL

## 2014-07-10 LAB — BASIC METABOLIC PANEL
BUN: 20 mg/dL (ref 4–21)
CREATININE: 0.7 mg/dL (ref 0.5–1.1)
Glucose: 343 mg/dL
Potassium: 3.9 mmol/L (ref 3.4–5.3)
Sodium: 139 mmol/L (ref 137–147)

## 2014-07-10 NOTE — Progress Notes (Signed)
Patient ID: Angela Austin, female   DOB: 12-27-1945, 68 y.o.   MRN: [id number]   Place of Service: [facility]  Allergies  Allergen Reactions  . Codeine   . Erythromycin   . Morphine And Related   . Spiriva Handihaler [Tiotropium Bromide Monohydrate]   . Tetracyclines & Related     Code Status: Full Code  Goals of Care: Longevity/STR  Chief Complaint  Patient presents with  . Acute Visit    elevated CBGs     HPI  68 y.o. female with PMH of CHF, COPD, GERD, liver cirrhosis, DM2, depression among others is being seen for an acute visit for elevated CBGs. CBGs in 400s with last reading 452. She is currently on glyburide 6mg  two times daily. Seen in her room today. Appears comfortable in wheelchair.   Review of Systems Constitutional: Negative for fever, chills, and fatigue. HENT: Negative for ear pain, congestion, and sore throat Eyes: Negative for eye pain, eye discharge, and visual disturbance  Cardiovascular: Negative for chest pain, palpitations. Positive for leg swelling Respiratory: Negative cough and wheezing. Positive for shortness of breath Gastrointestinal: Negative for nausea and vomiting. Negative for abdominal pain, diarrhea and constipation.  Genitourinary: Negative for  dysuria Endocrine: Negative for polydipsia, polyphagia, and polyuria Musculoskeletal: Negative for back pain, joint pain, and joint swelling  Neurological: Negative for dizziness and headache Skin: Negative for rash and wound.   Psychiatric: Negative for depression     Medication List       This list is accurate as of: 07/10/14  9:48 PM.  Always use your most recent med list.               albuterol 1.25 MG/3ML nebulizer solution  Commonly known as:  ACCUNEB  Take 1 ampule by nebulization every 6 (six) hours as needed for wheezing.     amLODipine 5 MG tablet  Commonly known as:  NORVASC  Take 5 mg by mouth daily.     BREO ELLIPTA 100-25 MCG/INH Aepb  Generic  drug:  Fluticasone Furoate-Vilanterol  Inhale 1 puff into the lungs daily.     doxycycline 100 MG DR capsule  Commonly known as:  DORYX  Take 100 mg by mouth 2 (two) times daily.     ferrous sulfate 325 (65 FE) MG tablet  Take 325 mg by mouth 2 (two) times daily with a meal.     FLUoxetine 20 MG tablet  Commonly known as:  PROZAC  Take 20 mg by mouth daily.     furosemide 20 MG tablet  Commonly known as:  LASIX  Take 20 mg by mouth 2 (two) times daily.     glyBURIDE micronized 6 MG tablet  Commonly known as:  GLYNASE  Take 6 mg by mouth 2 (two) times daily with a meal.     levothyroxine 50 MCG tablet  Commonly known as:  SYNTHROID, LEVOTHROID  Take 50 mcg by mouth daily before breakfast.     lisinopril 40 MG tablet  Commonly known as:  PRINIVIL,ZESTRIL  Take 40 mg by mouth daily.     morphine 10 MG/5ML solution  Take 2.5 mg by mouth every 4 (four) hours as needed for severe pain.     nadolol 40 MG tablet  Commonly known as:  CORGARD  Take 40 mg by mouth daily.     nystatin 100000 UNIT/ML suspension  Commonly known as:  MYCOSTATIN  Take 5 mLs by mouth 3 (three) times daily as needed.  pantoprazole 40 MG tablet  Commonly known as:  PROTONIX  Take 40 mg by mouth 2 (two) times daily.     spironolactone 50 MG tablet  Commonly known as:  ALDACTONE  Take 50 mg by mouth 2 (two) times daily.        Physical Exam  BP 124/66 mmHg  Pulse 61  Temp(Src) 96.7 F (35.9 C)  Resp 20  Wt 171 lb (77.565 kg)  SpO2 95%  Constitutional: WDWN elderly female in no acute distress. Conversant and pleasant HEENT: Normocephalic and atraumatic. PERRL. EOM intact.Oral mucosa moist. Posterior pharynx clear of any exudate or lesions.  Neck: Supple and nontender. No lymphadenopathy, masses, or thyromegaly. No JVD or carotid bruits. Cardiac: Normal S1, S2. RRR without appreciable murmurs, rubs, or gallops. Distal pulses intact. 2+ pitting edema of BLE Lungs: No respiratory distress.  Poor airway entry throughout all lung fields. Oxygen via [place] in place Abdomen: Audible bowel sounds in all quadrants. Soft, nontender, nondistended.  Musculoskeletal: able to move all extremities. No joint erythema or tenderness. Skin: Warm and dry. No rash noted. No erythema.  Neurological: Alert and oriented to person, place, and time. Psychiatric: Judgment and insight adequate. Appropriate mood and affect.   Labs Reviewed CBC Latest Ref Rng 07/10/2014 06/25/2014  WBC - 5.3 2.9  Hemoglobin 12.0 - 16.0 g/dL 10.7(A) 10.5(A)  Hematocrit 36 - 46 % 36 35(A)  Platelets 150 - 399 K/L 36(A) 85(A)    CMP Latest Ref Rng 07/10/2014 06/25/2014  BUN 4 - 21 mg/dL 20 11  Creatinine 0.5 - 1.1 mg/dL 0.7 0.6  Sodium 161137 - 096147 mmol/L 139 143  Potassium 3.4 - 5.3 mmol/L 3.9 4.4    Assessment & Plan 1. DM (diabetes mellitus), type 2, uncontrolled CBGs elevated. Will check A1c. Will start monitoring CBG TID for now. Start Humalog 5 units TID before meals for CBG >150 for now. Continue to monitor and reassess. Pending a1c will adjust diabetic medication.   2. Dyspnea and respiratory abnormality Poor to no airway entry. Mostly likely secondary to COPD. Continue continuous oxygen therapy at 3lpm via nasal cannula. Continue albuterol via neb every six hours as needed for shortness of breath. Will get cxr for further evaluation.   3. Bilateral leg edema Continue lasix 20mg  twice daily. Will order ted hose for BLE while awake. Continue to monitor for now  Diagnostic Studies/Labs Ordered: a1c, cxr   Family/Staff Communication Plan of care discussed with resident and nursing staff. Resident and nursing staff verbalized understanding and agree with plan of care. No additional questions or concerns reported.    Loura BackKim Zaydah Nawabi, MSN, AGNP-C [facility] [address] [place], [place] [address] [phone] [8am-5pm] After hours: [phone]

## 2014-07-11 LAB — HEMOGLOBIN A1C: Hgb A1c MFr Bld: 8 % — AB (ref 4.0–6.0)

## 2014-07-14 ENCOUNTER — Non-Acute Institutional Stay (SKILLED_NURSING_FACILITY): Payer: Medicare Other | Admitting: Registered Nurse

## 2014-07-14 ENCOUNTER — Encounter: Payer: Self-pay | Admitting: Registered Nurse

## 2014-07-14 DIAGNOSIS — R110 Nausea: Secondary | ICD-10-CM

## 2014-07-14 DIAGNOSIS — R197 Diarrhea, unspecified: Secondary | ICD-10-CM

## 2014-07-14 DIAGNOSIS — IMO0002 Reserved for concepts with insufficient information to code with codable children: Secondary | ICD-10-CM

## 2014-07-14 DIAGNOSIS — E1165 Type 2 diabetes mellitus with hyperglycemia: Secondary | ICD-10-CM

## 2014-07-14 NOTE — Progress Notes (Signed)
Patient ID: Angela Austin, female   DOB: 03-22-1946, 68 y.o.   MRN: [id number]   [facility]  Allergies  Allergen Reactions  . Codeine   . Erythromycin   . Metformin And Related     GI bleed  . Morphine And Related   . Spiriva Handihaler [Tiotropium Bromide Monohydrate]   . Tetracyclines & Related     Code Status: Full Code  Goals of Care: Longevity/STR  Chief Complaint  Patient presents with  . Acute Visit    f/u labs     HPI  68 y.o. female with PMH of CHF, COPD, GERD, liver cirrhosis, DM2, depression among others is being seen for an acute visit for f/u on labs. A1c came back at 8.0. Seen in room today. Reported having diarrhea x 2 days and nausea, especially after eating. Had one BM this morning. No other concerns reported.   Review of Systems Constitutional: Negative for fever and chills HENT: Negative for ear pain, congestion, and sore throat Eyes: Negative for eye pain, eye discharge, and visual disturbance  Cardiovascular: Negative for chest pain, palpitations. Positive for leg swelling Respiratory: Negative cough and wheezing. Positive for shortness of breath Gastrointestinal: Positive for abdominal cramps, nausea, and diarrhea Genitourinary: Negative for  Dysuria Musculoskeletal: Negative for back pain, joint pain, and joint swelling  Neurological: Negative for dizziness and headache Skin: Negative for rash and wound.   Psychiatric: Negative for depression     Medication List       This list is accurate as of: 07/14/14  9:14 PM.  Always use your most recent med list.               albuterol 1.25 MG/3ML nebulizer solution  Commonly known as:  ACCUNEB  Take 1 ampule by nebulization every 6 (six) hours as needed for wheezing.     amLODipine 5 MG tablet  Commonly known as:  NORVASC  Take 5 mg by mouth daily.     BREO ELLIPTA 100-25 MCG/INH Aepb  Generic drug:  Fluticasone Furoate-Vilanterol  Inhale 1 puff into the  lungs daily.     doxycycline 100 MG DR capsule  Commonly known as:  DORYX  Take 100 mg by mouth 2 (two) times daily.     ferrous sulfate 325 (65 FE) MG tablet  Take 325 mg by mouth 2 (two) times daily with a meal.     FLUoxetine 20 MG tablet  Commonly known as:  PROZAC  Take 20 mg by mouth daily.     furosemide 20 MG tablet  Commonly known as:  LASIX  Take 20 mg by mouth 2 (two) times daily.     glyBURIDE micronized 6 MG tablet  Commonly known as:  GLYNASE  Take 6 mg by mouth 2 (two) times daily before a meal.     levothyroxine 50 MCG tablet  Commonly known as:  SYNTHROID, LEVOTHROID  Take 50 mcg by mouth daily before breakfast.     linagliptin 5 MG Tabs tablet  Commonly known as:  TRADJENTA  Take 5 mg by mouth daily.     lisinopril 40 MG tablet  Commonly known as:  PRINIVIL,ZESTRIL  Take 40 mg by mouth daily.     morphine 10 MG/5ML solution  Take 2.5 mg by mouth every 4 (four) hours as needed for severe pain.     nadolol 40 MG tablet  Commonly known as:  CORGARD  Take 40 mg by mouth daily.  nystatin 100000 UNIT/ML suspension  Commonly known as:  MYCOSTATIN  Take 5 mLs by mouth 3 (three) times daily as needed.     pantoprazole 40 MG tablet  Commonly known as:  PROTONIX  Take 40 mg by mouth 2 (two) times daily.     spironolactone 50 MG tablet  Commonly known as:  ALDACTONE  Take 50 mg by mouth 2 (two) times daily.        Physical Exam  BP 112/55 mmHg  Pulse 61  Temp(Src) 98.7 F (37.1 C)  Resp 17  SpO2 97%  Constitutional: WDWN elderly female in no acute distress. Conversant and pleasant HEENT: Normocephalic and atraumatic. PERRL. EOM intact.Oral mucosa moist. Posterior pharynx clear of any exudate or lesions.  Neck: Supple and nontender. No lymphadenopathy, masses, or thyromegaly. No JVD or carotid bruits. Cardiac: Normal S1, S2. RRR without appreciable murmurs, rubs, or gallops. Distal pulses intact. 2+ pitting edema of BLE Lungs: No respiratory  distress. Poor airway entry throughout all lung fields. Oxygen via [place] in place Abdomen: Audible bowel sounds in all quadrants. Soft, nontender, nondistended (has liver cirrhosis)  Musculoskeletal: able to move all extremities. No joint erythema or tenderness. Skin: Warm and dry. No rash noted. No erythema.  Neurological: Alert and oriented to person, place, and time. Psychiatric: Judgment and insight adequate. Appropriate mood and affect.   Labs Reviewed CBC Latest Ref Rng 07/10/2014 06/25/2014  WBC - 5.3 2.9  Hemoglobin 12.0 - 16.0 g/dL 10.7(A) 10.5(A)  Hematocrit 36 - 46 % 36 35(A)  Platelets 150 - 399 K/L 36(A) 85(A)    CMP Latest Ref Rng 07/10/2014 06/25/2014  BUN 4 - 21 mg/dL 20 11  Creatinine 0.5 - 1.1 mg/dL 0.7 0.6  Sodium 782137 - 956147 mmol/L 139 143  Potassium 3.4 - 5.3 mmol/L 3.9 4.4   Lab Results  Component Value Date   HGBA1C 8.0* 07/11/2014    Assessment & Plan 1. DM (diabetes mellitus), type 2, uncontrolled Recent a1c 8.0. Continue micronized glyburide 6mg  twice daily. Start tradjenta 5mg  daily. Just finished her prednisone taper, hopefully her CBG will improve. Sart Novolog 5 units three times daily before meals for cbg>150-350 and 8 units for cbg>350. Continue lisinopril. Not on statin. Will check lipid panel.  2. Nausea without vomiting Promethazine 25mg  every 8 hours as needed for nausea. Continue to monitor  3. Diarrhea Resolved today. Continue facility protocol for diarrhea. Continue to monitor patient status  Family/Staff Communication Plan of care discussed with resident and nursing staff. Resident and nursing staff verbalized understanding and agree with plan of care. No additional questions or concerns reported.    Loura BackKim Corbin Falck, MSN, AGNP-C [facility] [address] [place], [place] [address] [phone] [8am-5pm] After hours: [phone]

## 2014-07-16 LAB — LIPID PANEL
Cholesterol: 182 mg/dL (ref 0–200)
HDL: 39 mg/dL (ref 35–70)
LDL CALC: 131 mg/dL
Triglycerides: 131 mg/dL (ref 40–160)

## 2014-07-23 LAB — HEPATIC FUNCTION PANEL
ALK PHOS: 58 U/L (ref 25–125)
ALT: 23 U/L (ref 7–35)
AST: 24 U/L (ref 13–35)
BILIRUBIN, TOTAL: 0.7 mg/dL
Bilirubin, Direct: 0.2 mg/dL (ref 0.01–0.4)

## 2014-08-06 ENCOUNTER — Encounter: Payer: Self-pay | Admitting: Registered Nurse

## 2014-08-06 ENCOUNTER — Non-Acute Institutional Stay (SKILLED_NURSING_FACILITY): Payer: Medicare Other | Admitting: Registered Nurse

## 2014-08-06 DIAGNOSIS — I8510 Secondary esophageal varices without bleeding: Secondary | ICD-10-CM

## 2014-08-06 DIAGNOSIS — R5381 Other malaise: Secondary | ICD-10-CM

## 2014-08-06 DIAGNOSIS — F329 Major depressive disorder, single episode, unspecified: Secondary | ICD-10-CM

## 2014-08-06 DIAGNOSIS — I5031 Acute diastolic (congestive) heart failure: Secondary | ICD-10-CM

## 2014-08-06 DIAGNOSIS — E039 Hypothyroidism, unspecified: Secondary | ICD-10-CM

## 2014-08-06 DIAGNOSIS — R188 Other ascites: Secondary | ICD-10-CM

## 2014-08-06 DIAGNOSIS — D509 Iron deficiency anemia, unspecified: Secondary | ICD-10-CM

## 2014-08-06 DIAGNOSIS — E785 Hyperlipidemia, unspecified: Secondary | ICD-10-CM

## 2014-08-06 DIAGNOSIS — IMO0002 Reserved for concepts with insufficient information to code with codable children: Secondary | ICD-10-CM

## 2014-08-06 DIAGNOSIS — J449 Chronic obstructive pulmonary disease, unspecified: Secondary | ICD-10-CM

## 2014-08-06 DIAGNOSIS — K7460 Unspecified cirrhosis of liver: Secondary | ICD-10-CM

## 2014-08-06 DIAGNOSIS — J309 Allergic rhinitis, unspecified: Secondary | ICD-10-CM

## 2014-08-06 DIAGNOSIS — I10 Essential (primary) hypertension: Secondary | ICD-10-CM

## 2014-08-06 DIAGNOSIS — E1165 Type 2 diabetes mellitus with hyperglycemia: Secondary | ICD-10-CM

## 2014-08-06 DIAGNOSIS — F32A Depression, unspecified: Secondary | ICD-10-CM

## 2014-08-06 NOTE — Progress Notes (Signed)
Patient ID: Angela Austin, female   DOB: Apr 17, 1946, 69 y.o.   MRN: [id number]   Place of Service: [facility]  Allergies  Allergen Reactions  . Codeine   . Erythromycin   . Metformin And Related     GI bleed  . Morphine And Related   . Spiriva Handihaler [Tiotropium Bromide Monohydrate]   . Tetracyclines & Related     Code Status: Full Code/DNR  Goals of Care: Longevity/Comfort and Quality of Life  Chief Complaint  Patient presents with  . Discharge Note    HPI 69 y.o. female with PMH of severe COPD on continuous home O2 at 2lpm via [place], liver cirrhosis, esophageal varices, DM2, depression, hypothyroidism among others  is being seen for a discharge visit. Patient was here for short-term rehabilitation post hospital admission from 06/25/14 to 07/02/14 with acute respiratory failure in the setting of COPD and CHF exacerbation as well as ascites in the setting liver cirrhosis. Patient has worked with therapy team and is ready to be discharged home with [facility] PT/OT/RN/Aide. Seen in room today. Patient reports having clear nasal discharge and congestion. Otherwise, no complaints verbalized. She is excited about going home, but is very anxious about her upcoming GI procedure as she fears it will interrupt her progress and affect her COPD. She would like to postpone the procedure until she can see her pulmonologist.   Review of Systems Constitutional: Negative for fever, chills, and fatigue. Positive for ~30 lb weight loss, but happy about it b/c her "stomach is gone" HENT: Negative for ear pain. Positive for congestion and nasal drainage.  Eyes: Negative for eye pain, eye discharge, and visual disturbance  Cardiovascular: Negative for chest pain, palpitations, and leg swelling Respiratory: Negative cough, shortness of breath, and wheezing.  Gastrointestinal: Negative for nausea and vomiting. Negative for abdominal pain, diarrhea and constipation.  Genitourinary: Negative for   dysuria, frequency, urgency, and hematuria Endocrine: Negative for polydipsia, polyphagia, and polyuria Musculoskeletal: Negative for back pain, joint pain, and joint swelling  Neurological: Negative for dizziness, headache Skin: Negative for rash and wound.   Psychiatric: Negative for depression.    History   Social History  . Marital Status: Widowed    Spouse Name: N/A    Number of Children: N/A  . Years of Education: N/A   Occupational History  . Not on file.   Social History Main Topics  . Smoking status: Former Smoker -- 25 years    Types: Cigarettes  . Smokeless tobacco: Not on file  . Alcohol Use: Not on file  . Drug Use: Not on file  . Sexual Activity: Not on file   Other Topics Concern  . Not on file   Social History Narrative    Family Hx: Dementia and bone cancer in father. CAD in mother    Medication List       This list is accurate as of: 08/06/14  3:54 PM.  Always use your most recent med list.               albuterol 1.25 MG/3ML nebulizer solution  Commonly known as:  ACCUNEB  Take 1 ampule by nebulization every 6 (six) hours as needed for wheezing.     amLODipine 5 MG tablet  Commonly known as:  NORVASC  Take 5 mg by mouth daily.     BREO ELLIPTA 100-25 MCG/INH Aepb  Generic drug:  Fluticasone Furoate-Vilanterol  Inhale 1 puff into the lungs daily.  ferrous sulfate 325 (65 FE) MG tablet  Take 325 mg by mouth 2 (two) times daily with a meal.     FLUoxetine 20 MG tablet  Commonly known as:  PROZAC  Take 20 mg by mouth daily.     furosemide 20 MG tablet  Commonly known as:  LASIX  Take 20 mg by mouth 2 (two) times daily.     glyBURIDE micronized 6 MG tablet  Commonly known as:  GLYNASE  Take 6 mg by mouth 2 (two) times daily before a meal.     levocetirizine 5 MG tablet  Commonly known as:  XYZAL  Take 5 mg by mouth every evening.     levothyroxine 50 MCG tablet  Commonly known as:  SYNTHROID, LEVOTHROID  Take 50 mcg by mouth  daily before breakfast.     linagliptin 5 MG Tabs tablet  Commonly known as:  TRADJENTA  Take 5 mg by mouth daily.     lisinopril 40 MG tablet  Commonly known as:  PRINIVIL,ZESTRIL  Take 40 mg by mouth 2 (two) times daily.     nadolol 40 MG tablet  Commonly known as:  CORGARD  Take 40 mg by mouth daily.     pantoprazole 40 MG tablet  Commonly known as:  PROTONIX  Take 40 mg by mouth 2 (two) times daily.     promethazine 25 MG tablet  Commonly known as:  PHENERGAN  Take 25 mg by mouth every 8 (eight) hours as needed for nausea or vomiting.     spironolactone 50 MG tablet  Commonly known as:  ALDACTONE  Take 50 mg by mouth 2 (two) times daily.        Physical Exam  BP 100/60 mmHg  Pulse 67  Temp(Src) 96.7 F (35.9 C)  Resp 18  Ht 5\' 5"  (1.651 m)  Wt 141 lb 3.2 oz (64.048 kg)  BMI 23.50 kg/m2  SpO2 99%  Constitutional: WDWN elderly female in no acute distress. Conversant and pleasant  HEENT: Normocephalic and atraumatic. PERRL. EOM intact.Oral mucosa moist. Posterior pharynx clear of any exudate or lesions.  Neck: Supple and nontender. No lymphadenopathy, masses, or thyromegaly. No JVD or carotid bruits.  Cardiac: Normal S1, S2. RRR without appreciable murmurs, rubs, or gallops. Distal pulses intact. Trace dependent edema  Lungs: No respiratory distress. Poor airway entry throughout all lung fields. Oxygen via [place] at 2lpm in place  Abdomen: Audible bowel sounds in all quadrants. Soft, nontender, nondistended (has liver cirrhosis)  Musculoskeletal: able to move all extremities. No joint erythema or tenderness.  Skin: Warm and dry. No rash noted. No erythema.  Neurological: Alert and oriented to person, place, and time.  Psychiatric: Judgment and insight adequate. Appropriate mood and affect.  .   Labs Reviewed  CBC Latest Ref Rng 07/10/2014 06/25/2014  WBC - 5.3 2.9  Hemoglobin 12.0 - 16.0 g/dL 10.7(A) 10.5(A)  Hematocrit 36 - 46 % 36 35(A)  Platelets 150 - 399  K/L 36(A) 85(A)    CMP Latest Ref Rng 07/23/2014 07/10/2014 06/25/2014  BUN 4 - 21 mg/dL - 20 11  Creatinine 0.5 - 1.1 mg/dL - 0.7 0.6  Sodium 161137 - 147 mmol/L - 139 143  Potassium 3.4 - 5.3 mmol/L - 3.9 4.4  Alkaline Phos 25 - 125 U/L 58 - -  AST 13 - 35 U/L 24 - -  ALT 7 - 35 U/L 23 - -    Assessment & Plan 1. Esophageal varices in cirrhosis Followed by GI.  Continue nadolol  daily and protonix  twice daily.   2. Essential hypertension, benign BP stable. Continue amlodipine  daily, nadolol  daily, lisinopril  twice daily, lasix  twice daily, and aldactone  twice daily.    3. Anemia, iron deficiency Continue ferrous sulfate  twice daily.   4. Depression Continue prozac  daily  5. Physical deconditioning Continue to work with [facility] PT/OT for gait/balance/strength training to restore/maximize functional capacity. Fall risk precautions   6. DM (diabetes mellitus), type 2, uncontrolled Recent A1C 8.0. CBG range 11-296. Continue micronized glyburide  twice daily and trajenta  daily. Discontinue ssi as patient was not any insulin prior to hospitalization. Follow up with PCP  7. Allergic rhinitis, unspecified allergic rhinitis type Xyzal  daily. PCP to monitor  8. Hypothyroidism, unspecified hypothyroidism type Continue synthroid daily  9. COPD, severe Stable. Continue continuous oxygen therapy at 2lpm via nasal cannula. Continue Breo Ellipta 1 puff daily and albuterol 1.25mg /78mL via nebulizer every six hours as needed for shortness of breath and wheezing.   10. Acute diastolic HF (heart failure) Euvolemic on exam. Continue lisinopril  twice daily, lasix  twice daily, and aldactone  twice daily. Continue daily weight.   11. HLD LDL 131. Patient is not interested in medication at this time. Lifestyle modification as tolerated recommended. Follow up with PCP.   12. Liver cirrchosis with ascites Much improved. Abdomen  soft, nondistended on exam. Has lost about 30lbs since her admission to the facility. Continue aldactone  twice daily and lasix  twice daily.    Home health services: PT/OT/RN/Aide DME required: None PCP follow-up: Dr. Larwance Sachs on 08/21/13 @ 11:15am 30-day supply of prescription medications provided.   Time spent: 50 minutes on care coordination and counseling  Family/Staff Communication Plan of care discussed with patient and nursing staff. Patient and nursing staff verbalized understanding and agree with plan of care. No additional questions or concerns reported.    Loura Back, MSN, AGNP-C [facility] [address] [place], [place] [address] [phone] [8am-5pm] After hours: [phone]

## 2014-08-10 DIAGNOSIS — J441 Chronic obstructive pulmonary disease with (acute) exacerbation: Secondary | ICD-10-CM

## 2014-08-10 DIAGNOSIS — I509 Heart failure, unspecified: Secondary | ICD-10-CM

## 2014-08-10 DIAGNOSIS — I10 Essential (primary) hypertension: Secondary | ICD-10-CM

## 2014-08-10 DIAGNOSIS — M6281 Muscle weakness (generalized): Secondary | ICD-10-CM

## 2014-08-27 ENCOUNTER — Ambulatory Visit: Payer: Self-pay | Admitting: Oncology

## 2014-08-27 LAB — CBC CANCER CENTER
Basophil #: 0 x10 3/mm (ref 0.0–0.1)
Basophil %: 0.8 %
EOS ABS: 0.3 x10 3/mm (ref 0.0–0.7)
EOS PCT: 4.2 %
HCT: 36.7 % (ref 35.0–47.0)
HGB: 11.4 g/dL — ABNORMAL LOW (ref 12.0–16.0)
LYMPHS ABS: 0.6 x10 3/mm — AB (ref 1.0–3.6)
Lymphocyte %: 10.2 %
MCH: 25.4 pg — ABNORMAL LOW (ref 26.0–34.0)
MCHC: 31 g/dL — ABNORMAL LOW (ref 32.0–36.0)
MCV: 82 fL (ref 80–100)
MONO ABS: 0.6 x10 3/mm (ref 0.2–0.9)
MONOS PCT: 9.9 %
NEUTROS ABS: 4.7 x10 3/mm (ref 1.4–6.5)
Neutrophil %: 74.9 %
Platelet: 70 x10 3/mm — ABNORMAL LOW (ref 150–440)
RBC: 4.49 10*6/uL (ref 3.80–5.20)
RDW: 18.5 % — ABNORMAL HIGH (ref 11.5–14.5)
WBC: 6.3 x10 3/mm (ref 3.6–11.0)

## 2014-08-27 LAB — IRON AND TIBC
IRON BIND. CAP.(TOTAL): 289 ug/dL (ref 250–450)
IRON SATURATION: 36 %
IRON: 103 ug/dL (ref 50–170)
UNBOUND IRON-BIND. CAP.: 186 ug/dL

## 2014-08-27 LAB — LACTATE DEHYDROGENASE: LDH: 215 U/L (ref 81–246)

## 2014-08-27 LAB — FERRITIN: FERRITIN (ARMC): 333 ng/mL (ref 8–388)

## 2014-08-27 LAB — FOLATE: FOLIC ACID: 11 ng/mL (ref 3.1–17.5)

## 2014-09-01 ENCOUNTER — Ambulatory Visit: Payer: Self-pay | Admitting: Oncology

## 2014-09-05 ENCOUNTER — Other Ambulatory Visit: Payer: Self-pay | Admitting: Internal Medicine

## 2014-09-09 ENCOUNTER — Other Ambulatory Visit: Payer: Self-pay | Admitting: Internal Medicine

## 2014-10-21 ENCOUNTER — Other Ambulatory Visit: Payer: Self-pay | Admitting: Internal Medicine

## 2014-10-22 ENCOUNTER — Ambulatory Visit
Admit: 2014-10-22 | Disposition: A | Discharge status: Home / Self Care | Payer: Self-pay | Attending: Oncology | Admitting: Oncology

## 2014-10-24 LAB — CBC CANCER CENTER
BASOS PCT: 1.1 %
Basophil #: 0.1 x10 3/mm (ref 0.0–0.1)
EOS ABS: 0.2 x10 3/mm (ref 0.0–0.7)
Eosinophil %: 2.8 %
HCT: 33.9 % — AB (ref 35.0–47.0)
HGB: 11.1 g/dL — AB (ref 12.0–16.0)
LYMPHS ABS: 0.6 x10 3/mm — AB (ref 1.0–3.6)
Lymphocyte %: 8.5 %
MCH: 26.7 pg (ref 26.0–34.0)
MCHC: 32.6 g/dL (ref 32.0–36.0)
MCV: 82 fL (ref 80–100)
MONO ABS: 0.7 x10 3/mm (ref 0.2–0.9)
Monocyte %: 10.3 %
Neutrophil #: 5.2 x10 3/mm (ref 1.4–6.5)
Neutrophil %: 77.3 %
Platelet: 81 x10 3/mm — ABNORMAL LOW (ref 150–440)
RBC: 4.15 10*6/uL (ref 3.80–5.20)
RDW: 14.7 % — ABNORMAL HIGH (ref 11.5–14.5)
WBC: 6.7 x10 3/mm (ref 3.6–11.0)

## 2014-10-31 ENCOUNTER — Ambulatory Visit
Admit: 2014-10-31 | Disposition: A | Discharge status: Home / Self Care | Payer: Self-pay | Attending: Oncology | Admitting: Oncology

## 2014-11-22 NOTE — Discharge Summary (Signed)
PATIENT NAME:  Angela Austin, Angela Austin MR#:  Austin DATE OF BIRTH:  1946/04/11  DATE OF ADMISSION:  06/16/2014 DATE OF DISCHARGE:  06/18/2014  PRIMARY CARE PHYSICIAN: Dr. Larwance SachsBabaoff  PRIMARY GASTROENTEROLOGIST: Dr. Bluford Kaufmannh  PRESENTING COMPLAINT: Dark, blood-tinged stool.   DISCHARGE DIAGNOSES: 1.  History of esophageal varices.  2.  History of cirrhosis of liver. 3.  Hypertension.  4.  Chronic obstructive pulmonary disease, on home oxygen.   CODE STATUS: FULL code.   DISCHARGE MEDICATIONS: 1.  Fluoxetine 20 mg p.o. daily.  2.  Glyburide 6 mg 1 tablet b.i.d.  3.  Albuterol 2 puffs 4 times a day.  4.  Breo Ellipta 100/25 one puff daily.  5.  Nadolol 40 mg daily.  6.  Protonix 40 mg b.i.d.  7.  Ferrous sulfate 325 b.i.d.  8.  Nystatin suspension as needed.  9.  Albuterol nebs 3 mL q. 6 hours.  10.  Levothyroxine 50 mcg 1 p.o. daily.   DISCHARGE DIET: Low sodium, mechanical soft.   DISCHARGE FOLLOWUP:  1.  Follow up with Dr. Bluford Kaufmannh on your scheduled appointment in January.  2.  Follow up with Dr. Larwance SachsBabaoff in 1 to 2 weeks.  CONSULTATIONS: Gastroenterology with Dr. Marva PandaSkulskie.   DIAGNOSTIC DATA: Hemoglobin at discharge is 9.2. His potassium is 4.1.   VITALS AT DISCHARGE: Temperature was 98.1, pulse 61, blood pressure 150/72, and sats 99% on 3 liters.  BRIEF SUMMARY OF HOSPITAL COURSE: Angela Austin is a 69 year old Caucasian female with multiple medical problems who was recently diagnosed with cirrhosis of liver, nonalcoholic, comes to the Emergency Room with:  1.  Rectal bleed, appears likely old blood from previous admission. No bright red blood per rectum. No abdominal pain. Hemoglobin stable. Was placed on PPI drip along with octreotide drip, which was discontinued. Dr. Marva PandaSkulskie saw the patient and recommends followup with Dr. Bluford Kaufmannh as outpatient. Recently had GI work-up by Dr. Bluford Kaufmannh including EGD. The patient was started on mechanical soft diet prior to discharge. Hemoglobin was 9.2.  2.   Anemia, secondary to recent blood loss from GI bleed. On Protonix to prevent rebleed. Hemoglobin stable.  3.  Cirrhosis, secondary to NASH. The patient is on nadolol and Protonix.  4.  Hypothyroidism. On Synthroid.  5.  Type 2 diabetes. On glyburide. 6.  Pancytopenia. Could be from cirrhosis. Labs were stable. The patient was discharged to home in stable condition. ____________________________ Angela HailSona A. Allena KatzPatel, MD sap:sb D: 06/19/2014 07:33:55 ET T: 06/19/2014 11:51:21 ET JOB#: [id number]  cc: Angela Austin A. Allena KatzPatel, MD, <Dictator> Angela OraSONA A Angela Lockner MD ELECTRONICALLY SIGNED 07/03/2014 14:06

## 2014-11-22 NOTE — H&P (Signed)
PATIENT NAME:  Angela Austin, Angela Austin MR#:  [id number] DATE OF BIRTH:  1946-03-01  DATE OF ADMISSION:  05/29/2014  PRIMARY CARE PHYSICIAN:  Hassell HalimMarcus E. Babaoff, MD   REFERRING PHYSICIAN:  Enedina Finnerandolph N. Manson PasseyBrown, MD   CHIEF COMPLAINT:  Shortness of breath.   HISTORY OF PRESENT ILLNESS:  Angela Austin is a 69 year old female with a history of multiple medical problems including diabetes mellitus, depression, hypothyroidism, and cirrhosis with esophageal varices, who was admitted on 05/22/2014, and was discharged on 05/27/2014, for upper GI bleed from esophageal varices and esophagitis. After the EGD, the patient started to experience a cough, which gradually worsened to shortness of breath. The patient has underlying COPD. The patient's symptoms significantly worsened and concerning this called EMS and was brought to the Emergency Department. The patient was initially placed on BiPAP. A chest x-ray does not show any infiltrates. There is mild elevation of the WBC count of 12,000 with a left shift of 85%. The patient received multiple breathing treatments and continued to have diffuse wheezing.   PAST MEDICAL HISTORY: 1.  COPD.  2.  Upper GI bleed secondary to esophageal varices and esophagitis.  3.  Cirrhosis of unknown etiology.  4.  Acute blood loss anemia.  5.  Diabetes mellitus on oral medication.  6.  Thrombocytopenia secondary to cirrhosis.  7.  Gastroesophageal reflux disease.  8.  Urinary incontinence.   PAST SURGICAL HISTORY: 1.  Tonsillectomy.  2.  Adenoidectomy. 3.  Lumbar disk surgery.  4.  Conization of the cervix.  5.  Cataract surgery.   ALLERGIES:   1.  CODEINE.  2.  MORPHINE.  3.  ERYTHROMYCIN.  4.  TETRACYCLINE.  5.  SPIRIVA.  HOME MEDICATIONS: 1.  Prednisone 60 mg tapering dose.  2.  Protonix 40 mg 2 times a day.  3.  Nadolol 40 mg once a day.  4.  Levothyroxine 50 mcg once a day.  5.  Levofloxacin 500 mg every 24 hours.  6.  Glyburide 6 mg 2 times a day.  7.   Fluoxetine 20 mg once a day.  8.  Ferrous sulfate 325 mg 2 times a day.  9.  Breo Ellipta 1 puff once a day.  10.  Albuterol 4 times a day.   SOCIAL HISTORY:  Former smoker. Denies drinking alcohol or using illicit drugs.   FAMILY HISTORY:  Positive for heart disease. Grandmother had myocardial infarction. Mother with  cancer.   REVIEW OF SYSTEMS:  CONSTITUTIONAL:  Experiencing generalized weakness.  EYES:  No change in vision.  EARS, NOSE, AND THROAT:  No change in hearing.  RESPIRATORY:  Has cough, shortness of breath.  CARDIOVASCULAR:  No chest pain or palpitations.  GASTROINTESTINAL:  Has no nausea, vomiting, or abdominal pain.  GENITOURINARY:  No dysuria or hematuria.  HEMATOLOGIC:  No easy bruising or bleeding.  SKIN:  No rash or lesions.  MUSCULOSKELETAL:  No joint pains and aches.  NEUROLOGIC:  No weakness or numbness in any part of the body.   PHYSICAL EXAMINATION: GENERAL:  This is a well-built, well-nourished, age-appropriate female lying down in the bed, not in distress.  VITAL SIGNS:  Temperature 98.2, pulse 61, blood pressure 138/68, respiratory rate 20, oxygen saturation 100% on 2 liters of oxygen.  HEENT:  Head is normocephalic and atraumatic. Eyes:  No scleral icterus. Conjunctivae are normal. Pupils are equal and react to light. Mucous membranes are moist. No pharyngeal erythema.  NECK:  Supple. No lymphadenopathy. No JVD. No carotid bruit.  CHEST:  Has no focal tenderness.  LUNGS:  Bilateral diffuse wheezing. There are areas of poor air entry.  HEART:  S1, S2 regular. No murmurs are heard.  ABDOMEN:  Bowel sounds present. Soft, nontender, nondistended. No hepatosplenomegaly.  EXTREMITIES:  No pedal edema. Pulses are 2+.  NEUROLOGIC:  The patient is alert and oriented to place, person, and time. Cranial nerves II through XII are intact. Motor is 5/5 in upper and lower extremities.   SKIN:  No rash or lesions.  MUSCULOSKELETAL:  Good range of motion in all of the  extremities.   LABORATORY DATA:  CMP is completely within normal limits.   CBC:  WBC of 15.2, hemoglobin 10, platelet count of 127.   IMAGING:  Chest x-ray, one view, portable:  Emphysema without acute superimposed finding.   ASSESSMENT AND PLAN:  Angela Austin is a 69 year old female who comes with chronic obstructive pulmonary disease exacerbation.   1.  Chronic obstructive pulmonary disease exacerbation precipitated most likely from the esophagogastroduodenoscopy. The patient failed outpatient treatment. The patient was discharged home with prednisone and breathing treatments and significantly worsened. Admit the patient to a medical bed. Continue with the breathing treatments scheduled and Solu-Medrol. I will also keep the patient on Rocephin and Zithromax.  2.  Anemia secondary to acute blood loss. Hemoglobin is stable.  3.  Cirrhosis of unknown etiology. The patient is on nadolol and Protonix for esophageal varices.  4.  Hypothyroidism. Continue with Synthroid.  5.  Diabetes mellitus. Continue with glyburide.  6.  Keep the patient on deep vein thrombosis prophylaxis with sequential compression devices.    ____________________________ Susa Griffins, MD pv:nb D: 05/29/2014 03:43:54 ET T: 05/29/2014 04:14:47 ET JOB#: [id number]  cc: Susa Griffins, MD, <Dictator> Susa Griffins MD ELECTRONICALLY SIGNED 05/31/2014 23:24

## 2014-11-22 NOTE — H&P (Signed)
PATIENT NAME:  Angela Austin, Angela Austin MR#:  [id number] DATE OF BIRTH:  [birth year]  DATE OF ADMISSION:  05/22/2014  PRIMARY CARE PHYSICIAN: Kandyce RudMarcus Babaoff, M.D.   REFERRING EMERGENCY ROOM PHYSICIAN: Dorothea GlassmanPaul Malinda, M.D.    CHIEF COMPLAINT: Dark vomiting and dark stool.   HISTORY OF PRESENT ILLNESS: A 69 year old female, who has past history of non-insulin-dependent diabetes, gastroesophageal reflux disease, depression, hypothyroidism, irritable bowel syndrome, pernicious anemia, urinary incontinence, and COPD on 3 liters of home oxygen. Yesterday night, she had some dark stool, but no pain, and she went to bed. She could not sleep properly, just without any reason, but then today in the morning, at 7:45, she woke up to go to the bathroom and she threw up dark black vomitus. She had a total of 4 vomits and she also had loose stool, which was black color, 2 to 3 times. She messed up her clothes, changed, and called the EMS feeling very weak and dizzy, and brought to the Emergency Room. She was found having tachycardia and hemoglobin was 10.8, and given to the hospitalist team for further management of this issue.   On further questioning, she denies any frequent use of pain medications. She says the only thing she uses is Tylenol PM, which would be just sometimes. This week she used 2 tablets. She denies using any BC powder or aspirin, and never had any history of a gastric ulcer. Colonoscopy is not done, and there is no family history of colon cancer.   REVIEW OF SYSTEMS:  CONSTITUTIONAL: Negative for fever, fatigue. Positive for generalized weakness and dizziness. No weight loss or weight gain.  EYES: No blurring, double vision, discharge or redness.  ENT: No tinnitus, ear pain or hearing loss.  RESPIRATORY: No cough, wheezing, hemoptysis, or shortness of breath.  CARDIOVASCULAR: No chest pain, orthopnea, edema, arrhythmia, palpitations.  GASTROINTESTINAL: The patient had nausea and vomited dark black  vomitus 4 to 5 times, and has a little loose stool, and 2 to 3 times dark stool since this morning. No abdominal pain.  GENITOURINARY: No dysuria, hematuria or increased frequency.  ENDOCRINE: No heat or cold intolerance. No excessive sweating.  SKIN: No acne, rashes or lesions.  MUSCULOSKELETAL: No pain or swelling in the joints.  NEUROLOGICAL: No numbness, weakness, tremor or vertigo.  PSYCHIATRIC: No anxiety, insomnia, bipolar disorder.   PAST SURGICAL HISTORY: Tonsillectomy and adenoidectomy. Lumbar disk surgery, conization of cervix, cataract surgery.   FAMILY HISTORY: Positive for heart disease. Her grandmother had a myocardial infarction and her mother had carcinoma.  No history of any diabetes or colon cancer in the family.   SOCIAL HISTORY: Denies any smoking, currently. She last smoked 20 years ago, and before that, she used to smoke 1 pack per day. She denies alcohol or illegal drug use and lives home alone and does not require any support to walk at home.  HOME MEDICATIONS: 1. Levothyroxine 50 mcg once a day.  2. Glyburide 6 mg tablet 2 times a day.  3. Lovastatin 20 mg oral once a day.  4. Breo 1-puff inhalation once a day.  5. Albuterol 2-puff inhalation 4 times a day.   PHYSICAL EXAMINATION:  VITAL SIGNS: In the ER, temperature 98.1, pulse is 123, respirations 23, and blood pressure is 169/65, with 94% oxygen saturation with 3-liter oxygen.  GENERAL: The patient is fully alert and oriented to time, place, and person. Does not appear in any acute distress.  HEENT: Head and neck atraumatic. Conjunctivae pink. Oral mucosa moist.  Her lips are stained with black stuff.  NECK: Supple. No JVD.  RESPIRATORY: Bilateral equal air entry. There is some wheezing present.  CARDIOVASCULAR: S1, S2 present, regular, tachycardia. No murmur appreciated.  ABDOMEN: Soft, nontender. Bowel sounds present. No organomegaly.  SKIN: No acne, rashes, or lesions.  MUSCULOSKELETAL: No pain or  swelling in the joints.  NEUROLOGICAL: No numbness, weakness, tremor or vertigo PSYCHIATRIC: No anxiety, insomnia or bipolar disorder.  CENTRAL NERVOUS SYSTEM: Follows commands. Moves all 4 limbs. No tremor or rigidity. Power 5/5.  LEGS: No edema.  JOINTS: No swelling or tenderness.   IMPORTANT LABORATORY RESULTS: Glucose 258, BUN 25, creatinine 0.66, sodium 137, potassium 4.4, chloride 105, CO2 is 30. Calcium is 8.6. Total protein 6.4, albumin 3.1, bilirubin 0.7, alkaline phosphate 80.   WBC 12, hemoglobin 10.8, platelet count 103,000. MCV 76.   Chest x-ray, portable single view, shows emphysema without acute disease.   ASSESSMENT AND PLAN: A 69 year old female with a past medical history of diabetes, chronic obstructive pulmonary disease with home oxygen use, hypothyroidism and anemia, came to the hospital after having 3 to 4 episodes of dark vomit and 2 to 3 episodes of dark and loose stool this morning. No pain in the abdomen and no pain medication use.   1. Gastrointestinal bleed. Most likely it might be gastric pathology, likely ulcer, as the bleeding is from both her ends. We will admit her and monitor telemetry. Currently, hemoglobin is 10.8, so does not require an urgent transfusion. We will keep checking every 8 hours, and if it drops further, I discussed with the patient, and we might need to transfuse hemoglobin, packed red blood cells, at that time. She agrees for that, even after explaining the side effects and benefits.  2. For gastrointestinal bleed, we will keep her n.p.o. and we will give IV fluids to prevent dehydration, and we will continue monitoring frequent hemoglobins. We will get a proton pump inhibitor b.i.d. IV and we will get a gastroenterology consult for further management of this issue.  3. Chronic obstructive pulmonary disease with chronic respiratory failure and 3-liter oxygen use. Currently, there is no active wheezing. We will just continue her nebulizer and  inhaler therapy, and provide oxygen support as needed.  4. Diabetes. We will hold oral long-acting medication as she will be kept n.p.o., and we will keep checking blood sugar with insulin sliding scale coverage.  5. Hypothyroidism. Continue levothyroxine.   CODE STATUS: Full Code. I discussed with the patient about her wishes and she says she has 2 sons and 1 daughter, who will make decisions for her. She does not have any official healthcare power of attorney paperwork done, but she would like to have everything done in case of emergency.   TOTAL TIME SPENT ON THIS ADMISSION: 50 minutes.    ____________________________ Hope Pigeon Elisabeth Pigeon, MD vgv:JT D: 05/22/2014 11:29:21 ET T: 05/22/2014 13:18:13 ET JOB#: [id number]  cc: Hope Pigeon. Elisabeth Pigeon, MD, <Dictator> Kandyce Rud, MD Herbon E. Meredeth Ide, MD Heath Gold [facility] MD ELECTRONICALLY SIGNED 06/11/2014 22:12

## 2014-11-22 NOTE — H&P (Signed)
PATIENT NAME:  Angela Austin, Angela Austin MR#:  [id number] DATE OF BIRTH:  14-Sep-1945  DATE OF ADMISSION:  06/16/2014  REFERRING EMERGENCY ROOM PHYSICIAN: Lurena Joinerebecca Austin. Shaune PollackLord, MD  PRIMARY CARE PHYSICIAN: Kandyce RudMarcus Babaoff, MD  PRIMARY GASTROENTEROLOGIST: Ezzard StandingPaul Y. Oh, MD  CHIEF COMPLAINT: Dark black, blood-tinged stool.   HISTORY OF PRESENT ILLNESS: This very pleasant 69 year old female with known history of cirrhosis due to nonalcoholic fatty liver disease and varices presents today with blood in her stool x 1 day. She reports that she has been in her usual state of health, maybe slightly fatigued for the past 24 hours. She has developed some abdominal distention and bloating, but no abdominal pain. This morning, she had 2 bowel movements, the first containing blood, and the second dark black. She described a small amount of stool, not diarrhea. No nausea or vomiting. Her appetite has been normal, but each time she eats she has a small bowel movement after eating. No fevers, chills, confusion or chest pain noted.   PAST MEDICAL HISTORY:  1.  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  2.  Esophageal varices.  3.  Chronic obstructive pulmonary disease.  4.  Chronic respiratory failure with hypoxia requiring 3 liters nasal cannula chronically.  5.  Diabetes mellitus type 2.  6.  Hypothyroidism.  7.  Pancytopenia.  8.  Gastroesophageal reflux disease.  9.  Urinary incontinence.   PAST SURGICAL HISTORY:  1.  Tonsillectomy.  2.  Myringotomy.  3.  Lumbar disk surgery.  4.  Conization of the cervix.  5.  Cataract surgery.   ALLERGIES:  1.  CODEINE.  2.  MORPHINE.  3.  ERYTHROMYCIN.  4.  TETRACYCLINE.  5.  SPIRIVA.   SOCIAL HISTORY: The patient currently lives alone. She uses oxygen chronically, also occasionally uses a walker. She has 3 children. She has family members staying with her all the time now, including her father who is 787 and in good health. She has home health nursing, PT and OT  since her last discharge. She is a former smoker, has not smoked greater than 25 years. Does not drink alcohol.   FAMILY HISTORY: Her father is [id number] and in good health, staying with her currently. Several family members have had heart disease. No diabetes or colon cancer.   HOME MEDICATIONS:  1.  Pantoprazole 40 mg 1 tablet twice a day.  2.  Nystatin 100,000 units mouth 5 mL orally every 8 hours.  3.  Nadolol 40 mg 1 tablet once a day.  4.  Levothyroxine 50 mcg 1 capsule once a day.  5.  Glyburide 6 mg oral tablet 1 tablet twice a day.  6.  Fluoxetine 20 mg 1 capsule once a day.  7.  Ferrous sulfate 325 mg 1 tablet twice a day.  8.  Breo Ellipta 100 mcg/225 mcg/inhalations, 1 puff inhaled once a day.  9.  Albuterol CFC free, 90 mcg/inhalations inhaler 4 times a day as needed for shortness of breath.  10.  Albuterol 2.5 mg 3 mL every 6 hours as needed for shortness of breath.   REVIEW OF SYSTEMS:  CONSTITUTIONAL: Negative for fevers or chills. No change in weight. Positive for fatigue.  HEENT: No change in hearing or vision. No pain in the eyes or ears. No difficulty swallowing or pain in the mouth.  RESPIRATORY: No recent cough, wheezing, shortness of breath. Does have a history of COPD and is on 3 liters chronically.  CARDIOVASCULAR: No chest pain, orthopnea, palpitations or syncope.  GASTROINTESTINAL:  Positive as above for 1 dark, black and red bowel movement this morning. No nausea, vomiting, diarrhea or abdominal pain. Positive for cirrhosis and significant abdominal distention at this time.  GENITOURINARY: No dysuria or frequency. Positive for incontinence.  MUSCULOSKELETAL: No new pain in the back, neck, shoulders or legs. No myalgias.  NEUROLOGIC: No focal numbness or weakness. No confusion, seizure, memory loss or stroke.  PSYCHIATRIC: No uncontrolled anxiety or depression at this time.   PHYSICAL EXAMINATION:  VITAL SIGNS: Temperature 98.6, pulse 74, respirations 16, blood pressure  163/56, oxygenation 100% on 3 liters.  GENERAL: No acute distress.  HEENT: Pupils equal, round and reactive to light. Conjunctivae clear, extraocular motion intact. Oral mucous membranes are dry. Posterior oropharynx is clear with no exudate or edema.  NECK: No cervical lymphadenopathy. Trachea is midline. Thyroid is nontender.  RESPIRATORY: Lungs are clear to auscultation bilaterally with scattered fine crackles. No wheezes or rhonchi at this time. No respiratory distress.  CARDIOVASCULAR: Distant, regular rate and rhythm. No murmurs, rubs or gallops. Peripheral edema +1. Peripheral pulses +1.  ABDOMEN: Distended, nontender, soft. Bowel sounds decreased.  MUSCULOSKELETAL: No joint effusions. Strength is 5/5. Range of motion is normal.  NEUROLOGIC: Cranial nerves II through XII are grossly intact. Strength and sensation intact. Nonfocal neurologic examination.  PSYCHIATRIC: The patient is calm, alert and oriented x 4 with good insight into her clinical condition.   LABORATORY DATA: Sodium 143, potassium 3.7, chloride 99, bicarb 39, BUN 7, creatinine 0.75, glucose 272. Total protein 5.7, serum albumin 2.9. Other LFTs are normal. White blood cells 4.8, hemoglobin 10.2, platelets 44,000, MCV 81. INR is 1.2.   IMAGING: No imaging.   ASSESSMENT AND PLAN:  1.  Gastrointestinal bleed: Possibly due to variceal bleed. At this point, we will check a CBC every 6 hours. Starting with a very strong hemoglobin of 10. Blood pressure is stable. No further bleeding noted since this morning. Protonix and octreotide drips have been started. We will consult gastroenterology, Dr. Bluford Kaufmann, to see if further endoscopy is needed. Admit to the regular floor.  2.  Chronic obstructive pulmonary disease: No signs of exacerbation at this time. We will continue home medications and chronic oxygen.  3.  Chronic cirrhosis: The patient has just started seeing gastroenterology as an outpatient this week. She has a very distended  abdomen, does not seem to be overtly uncomfortable because of this. Not on diuretics at this time. We will hold off on starting as there is possibility of current gastrointestinal bleed. May need some diuresis versus paracentesis for comfort to reduce the size of the abdomen.  4.  Diabetes mellitus: Start sliding scale insulin while in hospital.  5.  Hypothyroid: Continue with levothyroxine.  6.  Prophylaxis: No pharmaceutical prophylaxis, given current bleeding. Is on a Protonix drip.   TIME SPENT ON ADMISSION: Forty-five minutes.   ____________________________ Ena Dawley. Clent Ridges, MD cpw:TT D: 06/16/2014 17:38:15 ET T: 06/16/2014 18:10:36 ET JOB#: [id number]  cc: Santina Evans P. Clent Ridges, MD, <Dictator> Gale Journey MD ELECTRONICALLY SIGNED 06/19/2014 13:20

## 2014-11-22 NOTE — Consult Note (Signed)
Details:   - GI Note:  Covering for Dr Bluford Kaufmannh No more hematemesis or melena Hgb stable.   u/s:  Cirrhosis  A/P:  - cont nadolol, protonix - GI clinic f/u with Dr Bluford Kaufmannh.   Electronic Signatures: Dow Adolphein, Matthew (MD)  (Signed 24-Oct-15 19:33)  Authored: Details   Last Updated: 24-Oct-15 19:33 by Dow Adolphein, Matthew (MD)

## 2014-11-22 NOTE — Consult Note (Signed)
Brief Consult Note: Diagnosis: Possible GIB.   Patient was seen by consultant.   Consult note dictated.   Comments: Appreciate consult for pleasant 69 y/o caucasian woman with history of CRF/COPD/chronic 02therapy/likely NASH cirrhosis, recent GIB 10/15 for evaluation of possibly repeat GIB. Patinet hospitalized 05/22/14 for hematemesis/melena & underwent EGD that visit, by Dr Bluford Kaufmannh, which demonstrated La Grade A esophagitis, small grade II esophageal varices, and a normal stomach/duodenum. Was started on nadolol 40mg  po daily at that time. States that she was rehospitalized shortly after procedure for respiratory failure, improved and released. States she was doing generally well in her abdomen: reports taking her nadolol daily, but in the am.  Does state that she came to the ED as she saw a black chalky looking stool yesterday, and was concerned that she may be bleeding in her stomach again, however states at this time that the stool was black and chalky, not black and shiny as when she had GIB last month. At this time denies the presence of any noticable blood in her stool, and there was only one stool that was black. Does take Pantoprazole 40mg  po daily. Denies NSAIDs. Denies further black stools. States that her abdomen has been somewhat bloated, but not today. Denies further GI complaints. States there was a holiday feast on Sunday with pot roast, mashed potatos. deviled eggs,slaw, and deserts.  takes Fe supplements bid. Her Bun in normal as is creatinine. Hgb is stable  to improved since last month. Pt/inr ok. Platelets 34.  US 10/15 with cirrhotic liver changes and splenomegaly but no ascites. Serologies negative for viral hepatitis, neg AMA/ASMA. She is on pantoprazole & octreotide drips and hemodynamically stable.  Abdomen benign on exam. Rectal exam with several external hemorrhoids, stool is medium brown/ heme neg.  Impression and plan. Episode of black stool. I am not sure this is actual melena.  hgb stable, bun normal, heme negative with brown stool today. Do recommend that she takes the nadolol at 5pm for greater efficacy. Will discuss further w/ Dr Marva PandaSkulskie.  Electronic Signatures: Vevelyn PatLondon, Angela Austin (NP)  (Signed [phone]-Nov-15 15:13)  Authored: Brief Consult Note   Last Updated: 17-Nov-15 15:13 by Keturah BarreLondon, Angela Austin (NP)

## 2014-11-22 NOTE — Consult Note (Signed)
Patient's pancytopenia is mildly improved today. CT scan results noted consistent with cirrhosis and mild splenomegaly which are likely contributing to her pancytopenia. No intervention is needed. [facility] weeks for repeat laboratory work and further evaluation.  Electronic Signatures: Gerarda FractionFinnegan, Timothy (MD)  (Signed on 27-Nov-15 10:55)  Authored  Last Updated: 27-Nov-15 10:55 by Gerarda FractionFinnegan, Timothy (MD)

## 2014-11-22 NOTE — Consult Note (Signed)
Chief Complaint:  Subjective/Chief Complaint Breathing much better compared to this AM.   VITAL SIGNS/ANCILLARY NOTES: **Vital Signs.:   26-Oct-15 12:59  Vital Signs Type Routine  Temperature Temperature (F) 98.6  Celsius 37  Temperature Source oral  Pulse Pulse 66  Respirations Respirations 20  Systolic BP Systolic BP 127  Diastolic BP (mmHg) Diastolic BP (mmHg) 56  Mean BP 79  Pulse Ox % Pulse Ox % 96  Pulse Ox Activity Level  At rest  Oxygen Delivery 3L   Brief Assessment:  GEN no acute distress   Respiratory decreased breath sounds   Lab Results: General Ref:  24-Oct-15 08:24   Mitochondrial (M2) Antibody ========== TEST NAME ==========  ========= RESULTS =========  = REFERENCE RANGE =  MITOCHONDRIAL (M2) AB  Mitochondrial (M2) Antibody Mitochondrial (M2) Antibody     [   3.9 Units            ]          0.0-20.0              Negative    0.0 - 20.0                                                Equivocal  20.1 - 24.9                                                Positive         >24.9                                                                    .              Mitochondrial (M2) Antibodies are found in 90-96% of                patients with primary biliary cirrhosis.               [facility]            No: [id number]           [address], [place], [place] [id number] Mila HomerWilliam F Hancock, MD         [phone]   Result(s) reported on 26 May 2014 at 04:50PM.  Anti-Smooth Muscle Antibody ========== TEST NAME ==========  ========= RESULTS =========  = REFERENCE RANGE =  ANTI-SMOOTH MUSCLE ABS  Actin (Smooth Muscle) Antibody Actin (Smooth Muscle) Antibody  [   7 Units              ]              0-19   Negative                     0 - 19                                 Weak positive  20 - 30                                 Moderate to strong positive     >30                                                                     .               Actin Antibodies are found in 52-85% of patients with                 autoimmune hepatitis or chronic active hepatitis and                 in 22% of patients with primary biliary cirrhosis.               [facility] No: [id number]           [address], [place], [place] [address]           Mila Homer, MD         [phone]   Result(s) reported on 26 May 2014 at 04:50PM.  Hepatitis Panel A, B, C ========== TEST NAME ==========  ========= RESULTS =========  = REFERENCE RANGE =  HEPATITIS PANEL A, B, C  HP5+HAVIgM+HBcIgM Hep A Ab, IgM                   [   Negative             ]          Negative Hep A Ab, Total                 [   Negative             ]          Negative HBsAg Screen                    [   Negative             ]          Negative Hep B Core Ab, IgM              [   Negative             ]          Negative Hep B Core Ab, Tot              [   Negative             ]         Negative Hep B Surface Ab, Qual          [   Non Reactive         ]                                                Non Reactive: Inconsistent with immunity,  less than 10 mIU/mL            Reactive:     Consistent with immunity,                                            greater than 9.9 mIU/mL HCV Ab                          [   <0.1 s/co ratio      ]           0.0-0.9 Comment:                        [   Final Report    ]                   Non reactive HCV antibody screen is consistent with no HCV infection, unless recent infection is suspected or other evidence exists to indicate HCV infection.               [facility]            No: [id number]          [address], [place], [place] [address]           Mila Homer, MD         [phone]   Result(s) reported on 26 May 2014 at 04:50PM.  Routine Hem:  24-Oct-15 08:24   Hemoglobin (CBC)  7.5 (Result(s) reported on 24 May 2014 at 08:39AM.)    16:25    Hemoglobin (CBC)  8.4 (Result(s) reported on 24 May 2014 at 04:59PM.)   Radiology Results: XRay:    26-Oct-15 10:30, Chest PA and Lateral  Chest PA and Lateral   REASON FOR EXAM:    SOB, weezing  COMMENTS:       PROCEDURE: DXR - DXR CHEST PA (OR AP) AND LATERAL  - May 26 2014 10:30AM     CLINICAL DATA:  Shortness of breath and wheezing    EXAM:  CHEST  2 VIEW    COMPARISON:  05/22/2014    FINDINGS:  Cardiac shadow is stable. The lungs are mildly hyperinflated without  focal infiltrate. Mild blunting of the costophrenic angles is noted  posteriorly which may be related to a small effusions. No  pneumothorax is noted. No bony abnormality is seen.     IMPRESSION:  COPD.    Tiny effusions posteriorly.      Electronically Signed    By: Alcide Clever M.D.    On: 05/26/2014 10:33         Verified By: Phillips Odor, M.D.,  [place]:    23-Oct-15 13:57, US Abdomen Limited Survey  US Abdomen Limited Survey   REASON FOR EXAM:    abnormal lab work, eval for cirrhosis and portal htn  COMMENTS:   Body Site: Gallbladder, Liver, Common Bile Duct    PROCEDURE: [place]  - US ABDOMEN LIMITED SURVEY  - May 23 2014  1:57PM     CLINICAL DATA:  Elevated white blood cell count with low albumin ;  suspect cirrhosis; and coffee ground emesis    EXAM:  US ABDOMEN LIMITED - RIGHT UPPER QUADRANT    COMPARISON:  None.    FINDINGS:  Gallbladder:  The gallbladder is adequately distended  with no evidence of stones,  wall thickening, or pericholecystic fluid. There is no positive  sonographic Murphy's sign.    Common bile duct:    Diameter: 5 mm    Liver:    The liver exhibits irregular contours of its borders and increased  echotexture. There is no discrete mass or ductal dilation.     IMPRESSION:  Abnormal appearance of the liver may reflect hepatic cirrhosis.  Hepatic protocol MRI or abdominal CT scanning would be useful next  steps. There is no acute abnormality of the gallbladder or  common  bile duct.      Electronically Signed    By: David  [place]    On: 05/23/2014 14:05         Verified By: DAVID A. [place], M.D., MD   Assessment/Plan:  Assessment/Plan:  Assessment Chronic liver disease. Breathing better.   Plan Will sign off. Have patient f/u in GI office. Will review liver serologies that were sent. Will likely order liver fibroscan to assess staging of fibrosis then. Thanks.   Electronic Signatures: Lutricia Feil (MD)  (Signed 26-Oct-15 17:28)  Authored: Chief Complaint, VITAL SIGNS/ANCILLARY NOTES, Brief Assessment, Lab Results, Radiology Results, Assessment/Plan   Last Updated: 26-Oct-15 17:28 by Lutricia Feil (MD)

## 2014-11-22 NOTE — Consult Note (Signed)
Note Type Consult   Subjective: Chief Complaint/Diagnosis:   Pancytopenia. HPI:   Patient continues to feel short of breath, but offers no further complaints today.   Review of Systems:  Performance Status (ECOG): 1  Review of Systems:   As per HPI. Otherwise, 10 point system review was negative.   Allergies:  Erythromycin: N/V/Diarrhea  Morphine: Hallucinations  Codeine: GI Distress, Hallucinations  Tetracycline: N/V/Diarrhea  Spiriva: Swelling  PFSH: Additional Past Medical and Surgical History: COPD, cirrhosis, diabetes, hypothyroidism.    Family history: CAD, dementia, "bone cancer".    Social history: Heavy tobacco use, none recently. Patient denies alcohol.   Home Medications: Medication Instructions Last Modified Date/Time  fluoxetine 20 mg oral capsule 1 cap(s) orally once a day 26-Nov-15 00:57  glyBURIDE micronized 6 mg oral tablet 1 tab(s) orally 2 times a day 26-Nov-15 00:57  Breo Ellipta 100 mcg-25 mcg/inh inhalation powder 1 puff(s) inhaled once a day 26-Nov-15 00:57  nadolol 40 mg oral tablet 1 tab(s) orally once a day 26-Nov-15 00:57  pantoprazole 40 mg oral delayed release tablet 1 tab(s) orally 2 times a day 26-Nov-15 00:57  ferrous sulfate 325 mg (65 mg elemental iron) oral tablet 1 tab(s) orally 2 times a day (with meals) 26-Nov-15 00:57  levothyroxine 50 mcg (0.05 mg) oral tablet 1 tab(s) orally once a day (in the morning) 26-Nov-15 00:57   Vital Signs:  :: vital signs stable, patient afebrile.   Physical Exam:  General: well developed, well nourished, and in no acute distress  Mental Status: normal affect  Eyes: anicteric sclera  Respiratory: clear to auscultation bilaterally  Cardiovascular: regular rate and rhythm, no murmur, rub, or gallop  Gastrointestinal: soft, nondistended, nontender, no organomegaly.  normal active bowel sounds  Musculoskeletal: No edema  Skin: No rash or petechiae noted  Neurological: alert, answering all questions  appropriately.  Cranial nerves grossly intact   Laboratory Results: Routine Hem:  28-Nov-15 05:22   WBC (CBC)  2.7  RBC (CBC) 4.04  Hemoglobin (CBC)  9.9  Hematocrit (CBC)  32.4  Platelet Count (CBC)  64  MCV 80  MCH  24.4  MCHC  30.5  RDW  19.3  Neutrophil % 88.6  Lymphocyte % 4.9  Monocyte % 6.4  Eosinophil % 0.0  Basophil % 0.1  Neutrophil # 2.4  Lymphocyte #  0.1  Monocyte # 0.2  Eosinophil # 0.0  Basophil # 0.0 (Result(s) reported on 28 Jun 2014 at 06:11AM.)   Orders: Chemotherapy Orders:   1 Ferumoxytol injection                    Order date: null  510 mg, IV Piggyback, once, 468 ml/hr, 15 minute(s)  Assessment and Plan: Impression:   Pancytopenia. Plan:   1. Pancytopenia: Chronic. Patient's white blood cell count is slightly decreased from her baseline. She has been thrombocytopenic since at least 2011 and it is relatively unchanged. She is slightly more anemic with decreased iron stores and was given 510 mg IV Feraheme today. CT of the abdomen only revealed cirrhosis and mild splenomegaly. The remainder of her workup is either negative or within normal limits. No further intervention is needed at this time. Patient does not require bone marrow biopsy. If patient is discharged, she can follow-up in the [facility] in 2-3 weeks for repeat laboratory work and further evaluation. consult, call with questions.  Electronic Signatures: Delight Hoh (MD)  (Signed [phone] 15:04)  Authored: Note Type, CC/HPI, Review of Systems, ALLERGIES,  Patient Family Social History, HOME MEDICATIONS, Vital Signs, Physical Exam, Lab Results Review, Chemo Orders, Assessment and Plan   Last Updated: 28-Nov-15 15:04 by Delight Hoh (MD)

## 2014-11-22 NOTE — Consult Note (Signed)
Brief Consult Note: Diagnosis: hematemesis/melena.   Patient was seen by consultant.   Consult note dictated.   Comments: Appreciate consult for 69 y/o caucasian woman with history of DM/copd on chronic 02 therapy followed by Dr Meredeth IdeFleming, for evaluation of hematemesis/melena. No hx EGD or colonoscopy. Reports she was feeling well and in her usual state of health until last night about 11p when she developed some aggravating luq pain/indigestion/nausea. Was unable to sleep much due to this.Passed a brown stool about that time.  States approx 0845 this am sudden onset of vomiting "pure black" material and passing several loose black stools: urgent with some incontinence. Had several episodes of this. Felt extremely weak & shaky. None since admission.  Hgb 10.8- states no hx anemia since childhood. Denies NSAIDs. Denies further gi complaints. Noted mild elevation to BUN with normal creatnine, some tachycardia but no hypotension. Pt 15.6/INR 1.3. Platelets somewhat decreased: reports history of splenomegaly for many years, denies hx liver disease, known cause for this. CXR with COPD changes. Has received some pantoprazole today. Feeling some better Impression and plan: Hematemesis/melena: concerning for PUD. Will change PPI to Pantoprazole gtt. Agree with serial hgb, recommend transfusing prn. Would recommend EGD as clinically feasible. Will discuss further with Dr Bluford Kaufmannh.  Electronic Signatures: Vevelyn PatLondon, Atom Solivan H (NP)  (Signed 22-Oct-15 15:42)  Authored: Brief Consult Note   Last Updated: 22-Oct-15 15:42 by Keturah BarreLondon, Jodene Polyak H (NP)

## 2014-11-22 NOTE — Consult Note (Signed)
Pt seen and examined. Please see C.London's notes. Admitted with hematemesis and melena. Mildly anemic. Still better now. On chronic O2. Placed on protonix iv. Will plan EGD tomorrow as long as breathing remains stable. Thanks.  Electronic Signatures: Lutricia Feilh, Vasil Juhasz (MD)  (Signed on 22-Oct-15 17:34)  Authored  Last Updated: 22-Oct-15 17:34 by Lutricia Feilh, Vada Swift (MD)

## 2014-11-22 NOTE — Consult Note (Signed)
PATIENT NAME:  Angela Austin, Angela Austin MR#:  [id number] DATE OF BIRTH:  [birth year]  DATE OF CONSULTATION:  05/22/2014   CONSULTING PHYSICIAN:  Keturah Barrehristiane H. Janisa Labus, NP  Gastrointestinal consultation ordered by Dr. Karrie MeresVichhani for evaluation of gastrointestinal bleed, hematemesis and melena.         HISTORY OF PRESENT ILLNESS: I appreciate consult for this 69 year old Caucasian woman with history of diabetes-chronic obstructive pulmonary disease on chronic oxygen therapy followed by Dr. Meredeth IdeFleming for evaluation of hematemesis, melena, no history of EGD or colonoscopy.  She reports she was feeling well and in her usual state of health until last night about 11:00 p.m. when she developed some aggravating left upper quadrant pain, indigestion, nausea, was not able to sleep much due to this, passed a brown stool.  About that time, states approximately 0845 this morning, a sudden onset of vomiting "pure black" material, passing several loose black stools, urgent with some incontinence, had several episodes of this and felt extremely weak and shaky. None since admission.  Hemoglobin on admit was 10.8. She states no history of anemia since childhood. Denies NSAIDs, denies further GI complaints. Noted mild elevation to BUN with normal creatinine, some tachycardia, but no hypertension.  PT 15.6, INR 1.3, platelets somewhat decreased. Reports history of splenomegaly for many years. Denies history of liver disease. No known cause for the splenomegaly. Chest x-ray with some COPD changes.  Has received a pantoprazole today and feeling some better right now, but weakness persists.   REVIEW OF SYSTEMS:  Ten systems reviewed. Significant for chronic shortness of breath, which she feels is stable at this time, otherwise unremarkable.   PAST MEDICAL HISTORY: Diabetes, hypothyroidism, COPD, O2 therapy follows with Dr. Meredeth IdeFleming, tonsillectomy, adenoidectomy, lumbar disk surgery, conization of cervix, cataract surgery.   FAMILY  HISTORY: Significant for heart disease, no colon cancer.  MEDICATIONS:   Levothyroxine 50 mcg once a day, glyburide 6 mg b.i.d., Bria 1 puff once a day, albuterol 2 puffs q.i.d. p.r.n., 02 continuous, Prozac 20 mg p.o. daily.   SOCIAL HISTORY: Former smoker, quit 20 years ago. No alcohol or illicits.    LABORATORY DATA:  Most recent labs: Glucose 258, BUN 25, creatinine 0.66, sodium 137, potassium 4.4, GFR greater than 60, calcium 8.6, total protein 6.4, albumin 3.1, total bilirubin 0.7, ALP 80, AST 25, ALT 20. WBC 12, hemoglobin 10.8, hematocrit 35.6, platelets 103,000. Red cells macrocytic. PT 15.6, INR 1.3. Chest x-ray with emphysema changes.   PHYSICAL EXAMINATION:  VITAL SIGNS: Most recent vital signs: Temperature 98.1, pulse 130, respiratory rate 20, blood pressure 170/78, SaO2 of 99% on 3 liters O2.  GENERAL: Chronically ill, but well-appearing woman resting in bed in no acute distress.  HEENT: Normocephalic, atraumatic. Conjunctivae pink. Sclerae are clear.  NECK: Supple. No lymphadenopathy, or JVD.  CHEST: Respirations eupneic. Some diminished breath sounds in the bases with mild expiratory wheezing.  She is able to speak in complete sentences. She is wearing her oxygen via nasal cannula.  CARDIAC: S1, S2, RRR. Some tachycardia noted, peripheral pulses palpable. No appreciable edema. No MRG.  Rhythm is regular.  ABDOMEN: Bowel sounds x 4. Soft, nontender, nondistended.  No guarding, rigidity, peritoneal signs, appreciable hepatosplenomegaly or other abnormalities.   EXTREMITIES:  MAEW x 4.  Strength 5 out of 5.  SKIN: Warm, dry, pale, pink. No erythema, lesion or rash.  PSYCHIATRIC: Pleasant, calm, cooperative.  NEUROLOGICAL: Alert, oriented x 3. Speech clear. No facial droop.   IMPRESSION AND PLAN: Hematemesis-melena concerning for peptic ulcer  disease. We will change PPI to pantoprazole drip.   I do agree with serial hemoglobin, recommend transfusing p.r.n.  Would recommend  esophagogastroduodenoscopy if clinically feasible. We will tentatively try to schedule this for tomorrow afternoon.  Her breathing will need to be, stable.  She will need to be n.p.o. after midnight.   Thank you very much for this consult.    These services were provided by Vevelyn Pat, MSN, [facility], in collaboration with Dr. Lutricia Feil, with who I have discussed this patient in full.      ____________________________ Keturah Barre, NP chl:DT D: 05/22/2014 17:20:58 ET T: 05/22/2014 17:44:03 ET JOB#: [id number]  cc: Keturah Barre, NP, <Dictator> Eustaquio Maize Shamar Engelmann FNP ELECTRONICALLY SIGNED 06/23/2014 17:11

## 2014-11-22 NOTE — H&P (Signed)
PATIENT NAME:  Angela Austin, Angela Austin MR#:  [id number] DATE OF BIRTH:  [birth year]  DATE OF ADMISSION:  06/25/2014  PRIMARY CARE PHYSICIAN:  Kandyce RudMarcus Babaoff, MD   CHIEF COMPLAINT: Shortness of breath, weakness.   HISTORY OF PRESENT ILLNESS: This is a 69 year old female who presents to the hospital due to worsening shortness of breath and weakness. The patient was just recently discharged from the hospital about a week ago after being treated for COPD exacerbation, now returns with similar symptoms. The patient had a post hospital follow up with Dr. Pilar PlateBabaoff's office. Over there, she was unable to ambulate and noted to be slightly hypoxic. They sent her over to the ER for further evaluation.   In the Emergency Room, the patient was noted to be hypoxic and noted to have significant wheezing. She was given some nebulizer treatments, and some steroids, but continued to have significant shortness of breath. Hospitalist services were therefore contacted for treatment and evaluation. The patient admits to a cough but it is nonproductive. She denies any fevers, chills, no nausea. No vomiting. No diarrhea. No abdominal pain. No chest pain. No other associated symptoms presently.   REVIEW OF SYSTEMS:   CONSTITUTIONAL: No documented fever. Positive generalized weakness. No weight gain, no weight loss.  EYES: No blurry or double vision.  ENT: No tinnitus. No postnasal drip. No redness of the oropharynx.  RESPIRATORY: Positive cough. Positive wheeze.  No hemoptysis. Positive COPD.  CARDIOVASCULAR: No chest pain, no orthopnea, no palpitations, or syncope.  GASTROINTESTINAL: No nausea. No vomiting, diarrhea. No abdominal pain. No melena, or hematochezia.  GENITOURINARY: No dysuria or hematuria.  ENDOCRINE: No polyuria or nocturia. No heat or cold intolerance.  HEMATOLOGIC: No anemia, no bruising, no bleeding.  INTEGUMENTARY: No rashes. No lesions.  MUSCULOSKELETAL: No arthritis. No swelling. No gout.  NEUROLOGIC:  No numbness or tingling. No ataxia. No seizure-type activity.  PSYCHIATRIC: No anxiety, no insomnia, no ADD.   PAST MEDICAL HISTORY: Consistent with COPD, chronic respiratory failure, history of liver cirrhosis, diabetes, hypothyroidism.   ALLERGIES: TO CODEINE, ERYTHROMYCIN, MORPHINE, SPIRIVA, AND TETRACYCLINE.   SOCIAL HISTORY: Used to be a smoker, quit many years ago. Does have a 25 pack-year smoking history. No alcohol abuse. No illicit drug abuse. Lives at home with her son and her nephew.   FAMILY HISTORY: Mother and father are both deceased. Father died from complications of a myocardial infarction; mother had dementia and cancer in her bone.   CURRENT MEDICATIONS: As follows, Breo Ellipta 100/25 one puff daily, iron sulfate 325 mg b.i.d., fluoxetine 20 mg daily, glyburide 6 mg b.i.d., Synthroid 50 mcg daily, nadolol 40 mg daily, and Protonix 40 mg b.i.d.   PHYSICAL EXAMINATION:  VITAL SIGNS: Presently is as follows, are noted to be temperature is 98.3, pulse 75, respirations 18, blood pressure 93/65, sats 100% on 2 liters nasal cannula.  GENERAL: She is a pleasant-appearing female in mild respiratory distress.  HEAD AND EYES AND EARS AND NOSE AND THROAT: Atraumatic, normocephalic. Extraocular muscles are intact. Pupils equal and reactive on to light. Sclerae anicteric. No conjunctival injection. No pharyngeal erythema.  NECK: Supple. There is no jugular venous distention. No bruits. No lymphadenopathy or thyromegaly.  HEART: Regular rate and rhythm. No murmurs, No rubs. No clicks.  LUNGS: She has prolonged inspiratory and expiratory phase. Positive end expiratory wheezing. Negative use of accessory muscles. No dullness to percussion.  ABDOMEN: Soft, flat, distended, good bowel sounds. No hepatosplenomegaly.  EXTREMITIES: No evidence of any cyanosis, clubbing, or peripheral  edema. Has +2 pedal and radial pulses bilaterally.  NEUROLOGIC: She is alert, awake and oriented x 3. No focal  motor or sensory deficits appreciated bilaterally, globally weak.  SKIN: Moist and warm with no rashes.  LYMPHATIC: There is no cervical or axillary lymphadenopathy.   LABORATORY: Shows serum glucose of 258, BUN 11, creatinine 0.5, sodium 143, potassium 4.4, chloride 100, bicarbonate 37. Troponin less than 0.02. White cell count 2.9, hemoglobin 10.5, hematocrit 35.2, platelet count of 85,000.   The patient did have a chest x-ray done which shows minimal bilateral pleural effusions, minimal bibasilar subsegmental atelectasis.   ASSESSMENT AND PLAN: This is a 69 year old female with history of chronic obstructive pulmonary disease on chronic O2, chronic respiratory failure, history of liver cirrhosis, diabetes, hypothyroidism presents to the hospital with shortness of breath, weakness, noted to be in chronic obstructive pulmonary disease exacerbation.  Problem:  1.  Chronic obstructive pulmonary disease exacerbation. The exact etiology of this is unclear, questionable if this is related to acute bronchitis or just worsening of her chronic obstructive pulmonary disease and deconditioning. For now, I will treat the patient with IV steroids. Continue, around-the-clock nebulizer treatments. Continue her Breo Ellipta. She is already on oxygen at home. I will hold off on antibiotics at this time.  2.  History of liver cirrhosis. The patient does have some abdominal distention. I will get a limited abdominal ultrasound to rule out ascites, if needed we will do a paracentesis. For now, we will continue her nadolol, Protonix and iron supplements.  3.  Diabetes. I will place her on her glyburide, also some sliding scale insulin as she will be on steroids.  4.  Hypothyroidism. Continue Synthroid.  5.  Depression. Continue Prozac.  6.  Generalized weakness. I will get a physical therapy consult to assess her mobility.   CODE STATUS: The patient is a full code.   TIME SPENT:  Was 50 minutes.     ____________________________ Rolly Pancake. Cherlynn Kaiser, MD vjs:nt D: 06/25/2014 18:25:32 ET T: 06/25/2014 18:39:23 ET JOB#: [id number]  cc: Rolly Pancake. Cherlynn Kaiser, MD, <Dictator> Houston Siren MD ELECTRONICALLY SIGNED 06/26/2014 17:25

## 2014-11-22 NOTE — Consult Note (Signed)
Note Type Consult   Subjective: Chief Complaint/Diagnosis:   Pancytopenia. HPI:   Patient is a 69 year old female who was recently admitted to the hospital with increasing shortness of breath as well as weakness. Further workup revealed worsening pancytopenia. Currently, she feels well and improved since admission. She denies any easy bleeding or bruising. She denies any fevers. She has a fair appetite, but denies weight loss. She has no neurologic complaints. She denies any nausea, vomiting, constipation, or diarrhea. She has no melena or hematochezia. She has no urinary complaints. Patient otherwise feels well and offers no further specific complaints.   Review of Systems:  Performance Status (ECOG): 1  Review of Systems:   As per HPI. Otherwise, 10 point system review was negative.   Allergies:  Erythromycin: N/V/Diarrhea  Morphine: Hallucinations  Codeine: GI Distress, Hallucinations  Tetracycline: N/V/Diarrhea  Spiriva: Swelling  PFSH: Additional Past Medical and Surgical History: COPD, cirrhosis, diabetes, hypothyroidism.    Family history: CAD, dementia, "bone cancer".    Social history: Heavy tobacco use, none recently. Patient denies alcohol.   Home Medications: Medication Instructions Last Modified Date/Time  fluoxetine 20 mg oral capsule 1 cap(s) orally once a day 26-Nov-15 00:57  glyBURIDE micronized 6 mg oral tablet 1 tab(s) orally 2 times a day 26-Nov-15 00:57  Breo Ellipta 100 mcg-25 mcg/inh inhalation powder 1 puff(s) inhaled once a day 26-Nov-15 00:57  nadolol 40 mg oral tablet 1 tab(s) orally once a day 26-Nov-15 00:57  pantoprazole 40 mg oral delayed release tablet 1 tab(s) orally 2 times a day 26-Nov-15 00:57  ferrous sulfate 325 mg (65 mg elemental iron) oral tablet 1 tab(s) orally 2 times a day (with meals) 26-Nov-15 00:57  levothyroxine 50 mcg (0.05 mg) oral tablet 1 tab(s) orally once a day (in the morning) 26-Nov-15 00:57   Vital Signs:  :: vital  signs stable, patient afebrile.   Physical Exam:  General: well developed, well nourished, and in no acute distress  Mental Status: normal affect  Eyes: anicteric sclera  Head, Ears, Nose,Throat: Normocephalic, moist mucous membranes, clear oropharynx without erythema or thrush.  Neck, Thyroid: No palpable lymphadenopathy, thyroid midline without nodules.  Respiratory: clear to auscultation bilaterally  Cardiovascular: regular rate and rhythm, no murmur, rub, or gallop  Gastrointestinal: soft, nondistended, nontender, no organomegaly.  normal active bowel sounds  Musculoskeletal: No edema  Skin: No rash or petechiae noted  Neurological: alert, answering all questions appropriately.  Cranial nerves grossly intact   Laboratory Results: Routine Chem:  26-Nov-15 06:20   Result Comment WBC - RESULTS VERIFIED BY REPEAT TESTING.  - NOTIFIED OF CRITICAL VALUE  - C/ MARCELLA TURNER _0  06-26-14. [facility]  - READ-BACK PROCESS PERFORMED.  Result(s) reported on 26 Jun 2014 at 07:08AM.  Glucose, Serum  246  BUN 12  Creatinine (comp) 0.69  Sodium, Serum 144  Potassium, Serum 4.0  Chloride, Serum 99  CO2, Serum  42  Calcium (Total), Serum  8.3  Anion Gap  3  Osmolality (calc) 295  eGFR (African American) >60  eGFR (Non-African American) >60 (eGFR values <58m/min/1.73 m2 may be an indication of chronic kidney disease (CKD). Calculated eGFR, using the MRDR Study equation, is useful in  patients with stable renal function. The eGFR calculation will not be reliable in acutely ill patients when serum creatinine is changing rapidly. It is not useful in patients on dialysis. The eGFR calculation may not be applicable to patients at the low and high extremes of body sizes, pregnant women, and vegetarians.)  Routine Hem:  26-Nov-15 06:20   WBC (CBC)  1.4  RBC (CBC) 4.04  Hemoglobin (CBC)  9.9  Hematocrit (CBC)  32.5  Platelet Count (CBC)  59  MCV 81  MCH  24.6  MCHC  30.5  RDW  19.3   Neutrophil % 82.1  Lymphocyte % 11.4  Monocyte % 6.1  Eosinophil % 0.2  Basophil % 0.2  Neutrophil #  1.2  Lymphocyte #  0.2  Monocyte #  0.1  Eosinophil # 0.0  Basophil # 0.0   Assessment and Plan: Impression:   Pancytopenia. Plan:   1. Pancytopenia: Chronic. Patient's white blood cell count is slightly decreased from her baseline. She has been thrombocytopenic since at least 2011 and it is relatively unchanged. She is slightly more anemic. Patient was noted to have splenomegaly previously, will get CT of the abdomen to further evaluate her liver and her spleen given her history of cirrhosis. Will also check iron stores, B 12, folate, SIEP as well as neutrophil and platelet antibodies for completeness. No intervention is needed at this time. Patient does not require bone marrow biopsy. If patient is discharged, she can follow-up in the [place] in 2-3 weeks for repeat laboratory work and further evaluation. consult, will follow.  Electronic Signatures: Delight Hoh (MD)  (Signed [phone] 13:30)  Authored: Note Type, CC/HPI, Review of Systems, ALLERGIES, Patient Family Social History, HOME MEDICATIONS, Vital Signs, Physical Exam, Lab Results Review, Assessment and Plan   Last Updated: 26-Nov-15 13:30 by Delight Hoh (MD)

## 2014-11-22 NOTE — Consult Note (Signed)
Chief Complaint:  Subjective/Chief Complaint Please see full GI consult and brief consult note.  Patietn admitted with possible recurrent melena after previous episode several weeks ago.   Patient hemodynamically stable. Labs stable (note pancytopenia) .  no abdominal pain, has been on ppi at home.  Heme negative on rectal exam. I have cut octreotide drip to half for nest 12 hours then d/c, shanged ppi to bid iv rather than drip.  Would change back to po if not decline or evidence of bleeding tomorrow.  GI o/p fu in 10-14 days.   VITAL SIGNS/ANCILLARY NOTES: **Vital Signs.:   17-Nov-15 15:50  Vital Signs Type Q 8hr  Temperature Temperature (F) 98.6  Celsius 37  Temperature Source oral  Pulse Pulse 65  Respirations Respirations 17  Systolic BP Systolic BP 135  Diastolic BP (mmHg) Diastolic BP (mmHg) 70  Mean BP 91  Pulse Ox % Pulse Ox % 97  Pulse Ox Activity Level  At rest  Oxygen Delivery 3L   Brief Assessment:  Cardiac Regular   Respiratory clear BS   Gastrointestinal details normal Soft  Nontender  Bowel sounds normal  No gaurding  protuberatn, mild to moderate ascites   Lab Results: Routine Chem:  16-Nov-15 11:43   BUN 7  17-Nov-15 04:01   BUN 7  Routine Coag:  16-Nov-15 11:43   INR 1.2 (INR reference interval applies to patients on anticoagulant therapy. A single INR therapeutic range for coumarins is not optimal for all indications; however, the suggested range for most indications is 2.0 - 3.0. Exceptions to the INR Reference Range may include: Prosthetic heart valves, acute myocardial infarction, prevention of myocardial infarction, and combinations of aspirin and anticoagulant. The need for a higher or lower target INR must be assessed individually. Reference: The Pharmacology and Management of the Vitamin K  antagonists: the seventh ACCP Conference on Antithrombotic and Thrombolytic Therapy. Chest.2004 Sept:126 (3suppl): [id number]. A HCT value >55% may  artifactually increase the PT.  In one study,  the increase was an average of 25%. Reference:  "Effect on Routine and Special Coagulation Testing Values of Citrate Anticoagulant Adjustment in Patients with High HCT Values." American Journal of Clinical Pathology 2006;126:400-405.)  Routine Hem:  16-Nov-15 11:43   WBC (CBC) 4.8  Hemoglobin (CBC)  10.2  Platelet Count (CBC)  44 (Result(s) reported on 16 Jun 2014 at 12:04PM.)    19:25   WBC (CBC)  2.6  Hemoglobin (CBC)  9.4  Platelet Count (CBC)  31  17-Nov-15 04:01   WBC (CBC)  3.2  Hemoglobin (CBC)  9.2  Platelet Count (CBC)  34   Electronic Signatures: Barnetta ChapelSkulskie, Martin (MD)  (Signed 17-Nov-15 19:13)  Authored: Chief Complaint, VITAL SIGNS/ANCILLARY NOTES, Brief Assessment, Lab Results   Last Updated: 17-Nov-15 19:13 by Barnetta ChapelSkulskie, Martin (MD)

## 2014-11-22 NOTE — Consult Note (Signed)
No more melena or nausea/vomiting. EGD showed mild esophagitis and esophageal varices though no recent stigmata of bleeding. Even though LFT and INR normal, will order liver u/s today. Can start clear liquid diet afterwards. Also, ordered nadolol 40mg  daily if BP ok. ( Apparently nadolol interacts with inhalers. So, moniter pt's breathing).  Continue protonix daily. Will have Dr. Shelle Ironein see patient over the weekend. Thanks.   Electronic Signatures: Lutricia Feilh, Rehman Levinson (MD) (Signed on 23-Oct-15 11:18)  Authored   Last Updated: 23-Oct-15 11:25 by Lutricia Feilh, Firmin Belisle (MD)

## 2014-11-22 NOTE — Discharge Summary (Signed)
PATIENT NAME:  Angela Austin, AUTH MR#:  [id number] DATE OF BIRTH:  [birth year]  DATE OF ADMISSION:  05/28/2014 DATE OF DISCHARGE:  06/03/2014  PRIMARY CARE PHYSICIAN:  Kandyce Rud, MD  PULMONOLOGIST:  Clenton Pare. Meredeth Ide, MD  FINAL DIAGNOSES: 1.  Chronic obstructive pulmonary disease exacerbation with chronic hypoxic respiratory failure.  2.  Anemia, recent blood loss and gastrointestinal bleed.  3.  Cirrhosis and nonalcoholic steatohepatitis.  4.  Diabetes.  5.  Hypothyroidism.  6.  Pancytopenia.  7.  Thrush.   MEDICATIONS ON DISCHARGE:  Include fluoxetine 20 mg daily, glyburide micronized 6 mg 1 capsule twice a day, albuterol CFC 2 puffs 4 times a day as needed for shortness breath, levothyroxine 50 mcg daily, Breo Ellipta 100 mcg/25 mcg 1 puff twice a day, nadolol 40 mg daily, Protonix 40 mg twice a day, ferrous sulfate 325 mg twice a day, prednisone taper 10 mg 5 tablets day one, 4 tablets day two, 3 tablets day three, 2 tablets day four, 1 tablet day five, 1/2 tablet day six and seven; DuoNeb nebulizer solution 3 mL every 6 hours; nystatin swish and swallow 5 mL every 8 hours for 10 days; Ceftin 500 mg twice a day for 5 more days. Stop taking Levaquin.   HOME HEALTH:  Yes, physical therapy and nurse to help with strength and assessment.   OXYGEN:  Three liters via nasal cannula.  DIET:  Low sodium, carbohydrate controlled, regular consistency.   ACTIVITY:  As tolerated.   REFERRAL:  Dr. Meredeth Ide in 1 to 2 weeks and Dr. Larwance Sachs on Friday, keep appointment.   REASON FOR ADMISSION:  The patient was admitted 05/29/2014, and discharged 06/03/2014. Came in with shortness of breath and admitted with COPD exacerbation, started on Rocephin and Zithromax and high-dose IV Solu-Medrol.   LABORATORY AND RADIOLOGICAL DATA DURING THE HOSPITAL COURSE INCLUDED:  EKG that showed normal sinus rhythm, no acute ST-T wave changes. Glucose was 210, BUN 20, creatinine 0.81, sodium 141, potassium 4.2,  chloride 102, CO2 of 29, calcium 8.4. Troponin was negative. White blood cell count was 15.2, hemoglobin and hematocrit 12.2 and 33.2, and platelet count 127,000. Chest x-ray:  Emphysema without acute superimposed finding. Last CBC showed white count of 3.3, hemoglobin 8.8, platelet count 57,000.   HOSPITAL COURSE PER PROBLEM LIST:  1.  For chronic obstructive pulmonary disease exacerbation with chronic hypoxic respiratory failure, the patient is very fragile. Any little cold can cause things. The last hospitalization, she had an endoscopy, which she thinks may have triggered her COPD exacerbation. She went home too quickly, she felt, and then that night developed more respiratory issues. Upon going home this time she feels much better than she did, and prednisone taper will be given. Ceftin is to be given. She finished her Zithromax while here in the hospital. Upon discharge, her lungs were clear. Decreased breath sounds.  2.  Anemia, recent gastrointestinal blood loss on Protonix to prevent rebleeding. On iron.  3.  Cirrhosis and nonalcoholic steatohepatitis. The patient does have pancytopenia. Follow up as outpatient with Dr. Bluford Kaufmann.  4.  Diabetes on glimepiride.  5.  Hypothyroidism on levothyroxine.  6.  Pancytopenia. Follow up as outpatient.  7.  Thrush. Will be on nystatin swish and swallow while on the steroids.   TIME SPENT ON DISCHARGE:  55 minutes.   ____________________________ Herschell Dimes. Renae Gloss, MD rjw:nb D: 06/03/2014 15:52:52 ET T: 06/04/2014 02:58:27 ET JOB#: [id number]  cc: Herschell Dimes. Renae Gloss, MD, <Dictator> Kandyce Rud, MD Herbon E.  Meredeth IdeFleming, MD  Salley ScarletICHARD J Josaiah Muhammed MD ELECTRONICALLY SIGNED 06/04/2014 11:02

## 2014-11-22 NOTE — Discharge Summary (Signed)
PATIENT NAME:  Angela Austin, Angela Austin MR#:  [id number] DATE OF BIRTH:  [birth year]  DATE OF ADMISSION:  06/25/2014 DATE OF DISCHARGE:    ADMITTING DIAGNOSIS: Chronic obstructive pulmonary disease exacerbation.   DISCHARGE DIAGNOSES:  1. Acute on chronic hypoxic hypercarbic respiratory failure.  2. Chronic obstructive pulmonary disease exacerbation.  3. Acute bronchitis.  4. Acute diastolic congestive heart failure.  5. Malignant essential hypertension.  6. Recent diagnosis of liver cirrhosis of unclear etiology.  7. Pancytopenia due to liver cirrhosis and gastrointestinal bleed due to esophageal varices.  8. Diabetes mellitus.  9. Hypothyroidism.  10. Depression.  11. Generalized weakness.   DISCHARGE CONDITION: Guarded.   DISCHARGE MEDICATIONS: The patient is to continue fluoxetine 20 mg p.o. daily, glyburide micronized 6 mg twice daily, Breo Ellipta 100/25 mg 1 puff once daily, nadolol 40 mg p.o. daily, pantoprazole 40 mg twice daily, iron sulfate 325 mg twice daily, levothyroxine 50 mcg p.o. daily, nystatin oral suspension 5 mL every 8 hours as needed, albuterol nebulizer 3 mL every 6 hours as needed, albuterol CFC free aerosol 2 puffs 4 times daily as needed, Glucerna shakes 237 mL twice daily, doxycycline monohydrate 100 mg twice daily, please watch for any adverse reactions to doxycycline as the patient is allergic to tetracycline, spironolactone 50 mg p.o. twice daily, prednisone taper 60 mg p.o. once on 07/03/2014, then taper by 10 mg every 2 days until stopped, morphine 0.13 mL which is 2.5 mg every 4 hours as needed for dyspnea, advance to higher doses if needed and more frequently if needed, lisinopril 40 mg twice daily, amlodipine 5 mg p.o. daily, furosemide 20 mg p.o. twice daily.   HOME OXYGEN:  2 liters of oxygen through nasal cannula.   DIET: 2 grams salt, low-fat, low-cholesterol, carbohydrate-controlled diet, dietary supplement Glucerna 2 times daily.  Diet consistency  mechanical soft.   ACTIVITY LIMITATIONS: As tolerated.    FOLLOWUP APPOINTMENTS: With physical therapy, CHF as well as pulmonary rehabilitation. Followup appointment with Dr. Larwance SachsBabaoff in 2 days after discharge.   CONSULTANTS: Care management, social work, palliative care, Dr. Orlie DakinFinnegan, Dr. Belia HemanKasa.    RADIOLOGIC STUDIES: Chest x-ray PA and lateral 06/25/2014 showed minimal bilateral pleural effusions, minimal basilar segmental atelectasis as well. Abdominal ultrasound limited survey 06/25/2014 showed mild abdominal ascites. CT of abdomen with contrast 06/26/2014 revealed morphologic changes consistent with cirrhosis, evidence of portal hypertension with moderate volume ascites and paraesophageal varices, mild splenomegaly, bilateral pleural effusions and basilar atelectasis, mild pneumobilia presumably related to prior sphincterotomy.  CT angiogram of chest 06/30/2014 showed findings suggestive of pulmonary edema with small bilateral effusions and diffuse body wall anasarca, otherwise no acute cardiopulmonary disease, specifically no evidence of pulmonary embolism, findings compatible with cirrhosis and portal venous hypertension with at least small volume of intraabdominal ascites and suspected gastric varices, nephrolithiasis, advanced centrilobular emphysematous change and diffuse bronchial wall thickening. Echocardiogram 06/29/2014 showed left ventricular ejection fraction by visual estimation 55%-60%, normal global left ventricular systolic function, showed a normal pattern of left ventricular diastolic filling, moderately elevated pulmonary arterial systolic pressures, mild tricuspid regurgitation read by Dr. Kristeen MissPhilip Nahser.    HOSPITAL COURSE:  The patient is a [age] year old Caucasian female with history of CVA, who presented to the hospital 06/25/2014 with complaints of shortness of breath as well as weakness. Please refer to Dr. Hilbert OdorSainani's admission note on 06/25/2014.  The patient's vitals, temperature  was 98.3, pulse was 75, respiration rate was 18, blood pressure 93/65, saturation was 100% on 2 liters of oxygen through  nasal cannula. Physical exam was remarkable for prolonged inspiratory as well as expiratory phase, positive end expiratory wheezing, negative for use of accessory muscles and no dullness to percussion. Otherwise no significant abnormalities specifically were found. On the patient's laboratories on admission 06/25/2014, glucose level was 256, creatinine was 0.56, bicarbonate level was elevated at 37, otherwise BMP was unremarkable. The patient's troponin was less than 0.02. White blood cell count was low at 2.9, hemoglobin was 10.5, platelet count was 85,000. EKG showed suspected lead reversal, interpretation assumes no reversal,  sinus rhythm with short PR, rightward axis, borderline EKG, but no significant change since prior EKG. Chest x-ray was concerning for possible subsegmental atelectasis as well as pleural effusions. The patient was admitted to the hospital for further evaluation. She was started on steroids, nebulizer, inhalers, as well as oxygen. Consultation with Dr. Belia Heman was obtained. Dr. 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 He recommended to continue steroids and wean them as tolerated, albuterol nebulizers as well as Pulmicort nebulizers, continue Advair, Spiriva and check alpha-1 antitrypsin levels, get echocardiogram. The patient was continued on current medications, however no significant improvement was found. She had a CT scan of her chest done on 06/30/2014, which revealed pulmonary edema. She was initiated at that point on diuretics. Her blood pressure was also found to be markedly elevated in the range of 180s-190s. She was initiated on lisinopril. With diuretics as well as lisinopril her condition improved and she  was weaned down to 2 liters of oxygen through nasal cannula which is her baseline. She felt much more comfortable, however had a cough with some thick phlegm production, which was not colored, clear. At that point it was felt that the patient may need to be on antibiotic therapy and doxycycline was recommended for her upon discharge. However it appeared the patient does have multiple adverse reactions to multiple antibiotics, so caution should be taken in administering those medications, watching her carefully for any side effects due to her adverse reaction to tetracyclines in the past.   In regards to her acute diastolic CHF, as mentioned above the patient is to continue diuretics, nadolol, and oxygen therapy as well as lisinopril, watching her blood pressure readings closely and advancing her blood pressure medications depending on her needs, keeping her systolic blood pressure below 161.  It is also recommended to slowly wean her down from diuretics depending on her condition. Oxygen should be continued, watching her CO2 levels very closely.  On the day of discharge BMP reveals a CO2 level as high as 41. It needs to be checked periodically to make sure that her CO2 level does not climb up significantly.  If it does the diuretic dose should be decreased and the patient may need to be on BiPAP for a short period of time.   Regarding hypertension, as mentioned above the patient is to continue nadolol, lisinopril, and Norvasc was just added for her on the day of discharge. The patient may need to have those medications advanced depending on her needs.   In regards to her history of liver cirrhosis, her CT scan as well as ultrasound revealed ascites. The patient was initiated on spironolactone for ascites as well as diuretic Lasix. It is recommended to follow the patient's condition closely and have gastroenterology consultation and followup as outpatient. As mentioned above the patient was seen by  gastroenterologist while she was in the hospital on her last admission, but not on this admission.   In regards to pancytopenia, the patient's pancytopenia was felt to be related to liver cirrhosis according to Dr. Orlie Dakin.  No further interventions were recommended. The patient had some laboratory studies done to investigate her liver and lung disease. Vitamin B12 level was found to be high at 999. The patient's alpha-1 antitrypsin was found to be normal at 163. The patient's haptoglobin was found to be normal at 44. The patient's protein immunoelectrophoresis was normal, M-spike was not observed and neutrophil antibody test results are pending. The patient was evaluated by palliative care and felt that she may benefit from palliative care at home, however the patient still wanted to try physical therapy and chose rehabilitation facility. She is to continue rehabilitation as long as she is able to comply with recommendations and if she is deteriorating she may need to be transitioned to palliative care or hospice care in the future depending on her condition. The patient is being discharged to skilled nursing facility with the above-mentioned medications and followup.   On the day of discharge temperature was 97.4, pulse was 61, respiration was 17-18, blood pressure 174/71. Saturation was 94%-95% on 2 liters of oxygen through nasal cannula at rest. Of note the patient's systolic blood pressure is high, but this is before her medication advancement.   TIME SPENT: 50 minutes.    ____________________________ Katharina Caper, MD rv:bu D: 07/02/2014 12:23:46 ET T: 07/02/2014 12:58:28 ET JOB#: [id number]  cc: Katharina Caper, MD, <Dictator> Kandyce Rud, MD Maddilynn Esperanza MD ELECTRONICALLY SIGNED 07/29/2014 20:50

## 2014-11-22 NOTE — Discharge Summary (Signed)
PATIENT NAME:  Angela Austin, Angela Austin MR#:  [id number] DATE OF BIRTH:  Aug 09, 1945  DATE OF ADMISSION:  05/22/2014 DATE OF DISCHARGE:  05/27/2014  DISCHARGE DIAGNOSES:  1.  Chronic obstructive pulmonary disease exacerbation.  2.  Acute on chronic respiratory failure.  3.  Upper gastrointestinal bleed due to esophageal varices and esophagitis.  4.  Hepatic cirrhosis of unknown etiology.  5.  Acute blood loss anemia.  6.  Diabetes mellitus.  7.  Thrombocytopenia secondary to cirrhosis.  8.  Hypernatremia.   CONSULTATIONS: Ezzard Standingaul Y. Bluford Kaufmannh, MD and Dow AdolphMatthew Rein, MD with GI.   PROCEDURES: EGD which showed mild esophagitis and esophageal varices.   ADMITTING HISTORY AND PHYSICAL: Please see detailed Hdictated previously. In brief, a 69 year old female patient with history of diabetes, GERD, hypothyroidism presents to the Emergency Room with complain of dark vomiting and dark stool. The patient was found to have GI bleed, admitted to the hospitalist service.   HOSPITAL COURSE:  1.  Upper GI bleed secondary to esophageal varices and esophagitis. The patient was admitted onto medical floor, had 1 unit of packed RBC transfused secondary to drop in hemoglobin. Had an EGD done which showed esophageal varices and esophagitis. The patient was started on b.i.d. PPI and also iron at time of discharge. The patient did have melena at time of admission, which had resolved by discharge day.  2.  Cirrhosis of unknown etiology. The patient did have an ultrasound of the abdomen done, which was done to investigate for her esophageal varices. Her cirrhosis at this point is of unknown etiology. She also had some thrombocytopenia secondary to cirrhosis. She will follow up with GI as outpatient. Hepatitis A, B, C AMA, and smooth muscle antibodies have been ordered at this point. She will need further imaging studies including possible liver biopsy as outpatient, which has been discussed with the patient and her family at bedside.   3.  Acute chronic obstructive pulmonary disease exacerbation with acute on chronic respiratory failure. The patient had increasing shortness of breath. Her shortness of breath was secondary to COPD exacerbation. She also seemed to have some mild fluid overload after her blood transfusion and improved with Lasix, steroids and nebulizer therapy.  4.  Acute blood loss anemia secondary to GI bleed, stable.  5.  Diabetes mellitus, fairly controlled.   DISCHARGE EXAMINATION: Prior to discharge, the patient has no abdominal tenderness, has mild wheezing which is her baseline. S1, S2 heard without any murmurs. No edema.  DISCHARGE MEDICATIONS:  1.  Protonix 40 mg 2 times a day.  2.  Fluoxetine 20 mg daily.  3.  Glyburide 6 mg oral 2 times a day.  4.  Albuterol 2 puffs inhaled 4 times a day as needed.  5.  Levothyroxine 50 mcg daily.  6.  Breo Ellipta 100/25 one puff inhaled once a day.  7.  Nadolol 40 mg once a day.  8.  Levaquin 500 mg oral once a day for 5 days.  9.  Ferrous sulfate 325 mg oral 2 times a day.  10.  Prednisone 60 mg tapered over 6 days.   DISCHARGE INSTRUCTIONS: Continuous home oxygen 3 liters. Low-sodium, carbohydrate -controlled diet. Activity as tolerated. Follow up with Dr. Bluford Kaufmannh and primary care physician in 1-2 weeks. The patient has been asked to call her doctor or return to the Emergency Room if she notices any further black stools or blood in her stools.   TIME SPENT ON DAY OF DISCHARGE IN DISCHARGE ACTIVITY: Thirty-five minutes.  ____________________________ Molinda Bailiff Ziah Turvey, MD srs:TT D: 05/28/2014 14:05:21 ET T: 05/28/2014 22:07:20 ET JOB#: [id number]  cc: Wardell Heath R. Mylan Lengyel, MD, <Dictator> Kandyce Rud, MD Ezzard Standing. Bluford Kaufmann, MD Orie Fisherman MD ELECTRONICALLY SIGNED 06/05/2014 14:56

## 2014-11-22 NOTE — Consult Note (Signed)
PATIENT NAME:  Angela Austin, SAMPSEL MR#:  [id number] DATE OF BIRTH:  02-22-1946  DATE OF CONSULTATION:  06/17/2014  REFERRING PHYSICIAN:   CONSULTING PHYSICIAN:  Keturah Barre, NP  REASON FOR CONSULTATION: GI consultation ordered by Dr. Clent Ridges for questionable heme-positive black stool with varices.   HISTORY OF PRESENT ILLNESS: Appreciate consultation for pleasant 69 year old Caucasian woman with history of CRS/COPD/chronic O2 therapy, likely NASH cirrhosis, recent GI bleed 10/15 for evaluation of possible repeat GI bleed. The patient hospitalized 05/22/2014 for hematemesis, melena, underwent EGD that visit by Dr. Bluford Kaufmann which demonstrated LA grade A esophagitis, small grade 2 esophageal varices, and a normal stomach/duodenum. She was started on nadolol 40 mg p.o. daily at that time. She states she was rehospitalized shortly after procedure for respiratory failure, improved and released. She states she was doing generally well in her abdomen. Reports taking her nadolol daily but in the morning. Does state that she came to the ED. She saw a black chalky-looking stool yesterday and was concerned that she may be bleeding in her stomach again; however, states at this time the stool was black and chalky not black and shiny as she had her GI bleed last month. At this time she denies the presence of any noticeable blood in her stool and there was only one stool that was black. She does take pantoprazole 40 mg p.o. daily. Denies NSAIDs. Denies further black stools. States that her abdomen has been somewhat bloated but not today. Denies further GI complaints. States there was a holiday feast on Sunday with pot roast, mashed potatoes, deviled eggs,desserts. Takes iron supplementation twice a day. Her BUN is normal, as is her creatinine. Hemoglobin is stable to improve since last month. PT-INR is okay. Platelets 34,000. Ultrasound 10/15 with cirrhotic liver changes and splenomegaly but no ascites. Serologies were  negative for viral hepatitis. Negative AMA/ASMA. She is on pantoprazole and octreotide drip and hemodynamically stable.   PAST MEDICAL HISTORY: Likeliness of steatohepatitis, grade 2 varices, COPD, chronic respiratory failure with hypoxia requiring 3 liters nasal cannula chronically, diabetes type 2, hypothyroidism, pancytopenia, gastroesophageal reflux disease, urinary incontinence, tonsillectomy, myringotomy, lumbar disk surgery, conization of the cervix, cataract surgery.   ALLERGIES: CODEINE, MORPHINE, ERYTHROMYCIN, TETRACYCLINE, SPIRIVA.    SOCIAL HISTORY: Lives alone. Her step-father helps care for her, is on oxygen chronically, occasionally uses a walker. Has 3 kids. A former smoker. No tobacco in 25 years, no EtOH, no illicits.   FAMILY HISTORY: Significant for heart disease. There is no diabetes or colon cancer.   HOME MEDICATIONS: Pantoprazole 40 mg b.i.d., nystatin 100,000 units 5 mL every 8 hours, nadolol 40 mg p.o. daily, levothyroxine 50 mcg once a day, glyburide 6 mg b.i.d., fluoxetine 20 mg p.o. daily, ferrous sulfate 325 b.i.d., Brio 1 puff daily, albuterol q. 4 hours p.r.n., albuterol nebulizer q. 6 hours p.r.n.   REVIEW OF SYSTEMS: Ten systems reviewed. Significant for intermittent urinary incontinence otherwise, chronic shortness of breath, chronic dyspnea on exertion, otherwise unremarkable.   LABORATORY DATA: Most recent labs: Glucose 144, BUN 7, creatinine 0.74, sodium 144, potassium 4.1, GFR greater than 60, calcium 7.8, total protein 5.7, albumin 2.9, total bilirubin 0.9, ALP 77, AST 27, ALT 34. WBC 3.2, hemoglobin 9.2, hematocrit 30.2, platelets 34,000. Red cells macrocytic. PT 14.9, INR 1.2. EGD done approximately 3 weeks ago with LA grade A esophagitis, grade 2 varices, normal stomach, normal duodenum. Liver serologies last month as indicated above.   PHYSICAL EXAMINATION: VITAL SIGNS: Most recent: Temperature 98.2, pulse  68, respiratory rate 18, blood pressure 147/68,  SaO2 98% on room air.  GENERAL: A chronically ill-appearing woman in no acute distress.  HEENT: Normocephalic, atraumatic. Sclerae clear. Conjunctivae pink. Wearing nasal cannula oxygen.  NECK: Supple. No JVD, lymphadenopathy, thyromegaly.  RESPIRATORY RATE: Respirations somewhat shallow, but unlabored and regular. Somewhat diminished at the bases, otherwise clear. Able to speak in complete sentences.  CARDIAC: S1, S2. RRR. No MRG. No appreciable edema at this point.  ABDOMEN: Bowel sounds x 4, nondistended, nontender. No rebound, tenderness, peritoneal signs, guarding, rigidity. I cannot appreciate any hepatosplenomegaly or other abnormalities.  SKIN: Warm, dry, pink. No erythema, lesion or rash.  MUSCULOSKELETAL: MAEW x 4. Strength 5/5.  NEUROLOGIC: Alert, oriented x 3. Speech clear. No facial droop. Cranial nerves II through XII intact.  PSYCHIATRIC: Pleasant, calm, cooperative, logical train of thought.  RECTAL: She has several large external hemorrhoids. They are not bleeding at this time. Digital exam reveals a couple of soft oblong masses consistent with internal hemorrhoids. Her stool is medium brown it is heme negative. Her rectal exam is nontender.   IMPRESSION AND PLAN: Episode of black stool. I am not sure that this is actually melena. She does have several external hemorrhoids. Her hemoglobin is stable. Her BUN is normal. Her stool was brown today. Do recommend that she takes nadolol at 5:00 p.m. for greater efficacy. Her respiratory status makes her a high risk candidate for repeat EGD. Will discuss further with Dr. Marva PandaSkulskie. Thank you for this consult.   These services were provided by Vevelyn Pathristiane London, MSN, [facility], in collaboration with Barnetta ChapelMartin Skulskie, MD.     ____________________________ Keturah Barrehristiane H. London, NP chl:at D: 06/18/2014 08:29:01 ET T: 06/18/2014 09:16:14 ET JOB#: [id number]  cc: Keturah Barrehristiane H. London, NP, <Dictator> Eustaquio MaizeHRISTIANE H LONDON FNP ELECTRONICALLY SIGNED  07/08/2014 17:25

## 2015-01-26 ENCOUNTER — Ambulatory Visit: Payer: Medicare Other | Admitting: Oncology

## 2015-01-26 ENCOUNTER — Other Ambulatory Visit: Payer: Medicare Other

## 2015-02-10 ENCOUNTER — Inpatient Hospital Stay: Payer: Medicare Other

## 2015-02-10 ENCOUNTER — Other Ambulatory Visit: Payer: Self-pay

## 2015-02-10 ENCOUNTER — Encounter: Payer: Self-pay | Admitting: Oncology

## 2015-02-10 ENCOUNTER — Inpatient Hospital Stay: Payer: Medicare Other | Attending: Oncology | Admitting: Oncology

## 2015-02-10 VITALS — BP 120/71 | HR 108 | Temp 97.1°F | Resp 16 | Wt 120.4 lb

## 2015-02-10 DIAGNOSIS — Z87891 Personal history of nicotine dependence: Secondary | ICD-10-CM | POA: Diagnosis not present

## 2015-02-10 DIAGNOSIS — E119 Type 2 diabetes mellitus without complications: Secondary | ICD-10-CM | POA: Diagnosis not present

## 2015-02-10 DIAGNOSIS — Z79899 Other long term (current) drug therapy: Secondary | ICD-10-CM | POA: Diagnosis not present

## 2015-02-10 DIAGNOSIS — F329 Major depressive disorder, single episode, unspecified: Secondary | ICD-10-CM | POA: Insufficient documentation

## 2015-02-10 DIAGNOSIS — K7460 Unspecified cirrhosis of liver: Secondary | ICD-10-CM

## 2015-02-10 DIAGNOSIS — D696 Thrombocytopenia, unspecified: Secondary | ICD-10-CM

## 2015-02-10 DIAGNOSIS — D649 Anemia, unspecified: Secondary | ICD-10-CM | POA: Diagnosis not present

## 2015-02-10 DIAGNOSIS — K219 Gastro-esophageal reflux disease without esophagitis: Secondary | ICD-10-CM | POA: Diagnosis not present

## 2015-02-10 DIAGNOSIS — E039 Hypothyroidism, unspecified: Secondary | ICD-10-CM | POA: Diagnosis not present

## 2015-02-10 DIAGNOSIS — J449 Chronic obstructive pulmonary disease, unspecified: Secondary | ICD-10-CM | POA: Diagnosis not present

## 2015-02-10 LAB — CBC
HCT: 38.4 % (ref 35.0–47.0)
Hemoglobin: 12.2 g/dL (ref 12.0–16.0)
MCH: 25.5 pg — ABNORMAL LOW (ref 26.0–34.0)
MCHC: 31.8 g/dL — AB (ref 32.0–36.0)
MCV: 80.3 fL (ref 80.0–100.0)
Platelets: 67 10*3/uL — ABNORMAL LOW (ref 150–440)
RBC: 4.79 MIL/uL (ref 3.80–5.20)
RDW: 13.8 % (ref 11.5–14.5)
WBC: 5.3 10*3/uL (ref 3.6–11.0)

## 2015-02-24 NOTE — Progress Notes (Signed)
[facility]  Telephone:(336) [phone] Fax:(336) [phone]  ID: Angela Austin OB: [birth year]  MR#: [id number]  AOZ#:[id number]  Patient Care Team: Kandyce Rud, MD as PCP - General (Family Medicine)  CHIEF COMPLAINT:  Chief Complaint  Patient presents with  . Follow-up    thrombocytopenia    INTERVAL HISTORY: Patient returns to clinic today for repeat laboratory work and further evaluation. She denies any easy bleeding or bruising. She denies any fevers. She has a fair appetite, but denies weight loss. She has no neurologic complaints. She denies any nausea, vomiting, constipation, or diarrhea. She has no melena or hematochezia. She has no urinary complaints. Patient offers no specific complaints today.  REVIEW OF SYSTEMS:   Review of Systems  Constitutional: Negative.   Respiratory: Negative.   Cardiovascular: Negative.   Endo/Heme/Allergies: Negative.     As per HPI. Otherwise, a complete review of systems is negatve.  PAST MEDICAL HISTORY: Past Medical History  Diagnosis Date  . Cirrhosis of liver not due to alcohol   . Diabetes   . Hypothyroidism   . Depression   . COPD (chronic obstructive pulmonary disease)   . GERD (gastroesophageal reflux disease)   . Nephrolithiasis   . ITP (idiopathic thrombocytopenic purpura)     PAST SURGICAL HISTORY: Past Surgical History  Procedure Laterality Date  . Tonsillectomy and adenoidectomy    . Lumbar disc surgery    . Cataract extraction      FAMILY HISTORY Family History  Problem Relation Age of Onset  . Alzheimer's disease Mother   . COPD Mother   . Cancer Mother   . COPD Father   . Diabetes Mellitus II Father   . Heart attack Father        ADVANCED DIRECTIVES:    HEALTH MAINTENANCE: History  Substance Use Topics  . Smoking status: Former Smoker -- 25 years    Types: Cigarettes  . Smokeless tobacco: Never Used  . Alcohol Use: No     Colonoscopy:  PAP:  Bone density:  Lipid  panel:  Allergies  Allergen Reactions  . Amoxicillin-Pot Clavulanate Other (See Comments)  . Codeine   . Erythromycin   . Metformin And Related     GI bleed  . Morphine And Related   . Morphine Sulfate Er Beads Other (See Comments)    caused severe cramping   . Omeprazole Other (See Comments)    GI side effects  . Spiriva Handihaler [Tiotropium Bromide Monohydrate] Swelling    Tongue  . Statins Other (See Comments)  . Tetracyclines & Related   . Ace Inhibitors Rash    Current Outpatient Prescriptions  Medication Sig Dispense Refill  . albuterol (ACCUNEB) 1.25 MG/3ML nebulizer solution Take 1 ampule by nebulization every 6 (six) hours as needed for wheezing.    Marland Kitchen FLUoxetine (PROZAC) 20 MG tablet Take 20 mg by mouth daily.    . Fluticasone Furoate-Vilanterol (BREO ELLIPTA) 100-25 MCG/INH AEPB Inhale 1 puff into the lungs daily.    . furosemide (LASIX) 20 MG tablet Take 20 mg by mouth 2 (two) times daily.    Marland Kitchen glyBURIDE micronized (GLYNASE) 6 MG tablet Take 6 mg by mouth 2 (two) times daily before a meal.    . levothyroxine (SYNTHROID, LEVOTHROID) 75 MCG tablet Take by mouth.    . pantoprazole (PROTONIX) 40 MG tablet Take 40 mg by mouth 2 (two) times daily.    Marland Kitchen spironolactone (ALDACTONE) 50 MG tablet Take 50 mg by mouth 2 (two) times daily.  No current facility-administered medications for this visit.    OBJECTIVE: Filed Vitals:   02/10/15 1451  BP: 120/71  Pulse: 108  Temp: 97.1 F (36.2 C)  Resp: 16     Body mass index is 20.03 kg/(m^2).    ECOG FS:0 - Asymptomatic  General: Well-developed, well-nourished, no acute distress. Eyes: Pink conjunctiva, anicteric sclera. Lungs: Clear to auscultation bilaterally. Heart: Regular rate and rhythm. No rubs, murmurs, or gallops. Abdomen: Soft, nontender, nondistended. No organomegaly noted, normoactive bowel sounds. Musculoskeletal: No edema, cyanosis, or clubbing. Neuro: Alert, answering all questions appropriately.  Cranial nerves grossly intact. Skin: No rashes or petechiae noted. Psych: Normal affect.   LAB RESULTS:  Lab Results  Component Value Date   NA 139 07/10/2014   K 3.9 07/10/2014   CL 95* 07/02/2014   CO2 41* 07/02/2014   GLUCOSE 196* 07/02/2014   BUN 20 07/10/2014   CREATININE 0.7 07/10/2014   CALCIUM 8.7 07/02/2014   PROT 5.1* 07/01/2014   ALBUMIN 2.6* 07/01/2014   AST 24 07/23/2014   ALT 23 07/23/2014   ALKPHOS 58 07/23/2014   BILITOT 0.4 07/01/2014    Lab Results  Component Value Date   WBC 5.3 02/10/2015   NEUTROABS 5.2 10/22/2014   HGB 12.2 02/10/2015   HCT 38.4 02/10/2015   MCV 80.3 02/10/2015   PLT 67* 02/10/2015     STUDIES: No results found.  ASSESSMENT: Thrombocytopenia.  PLAN:    1. Thrombocytopenia: Patient's platelet count is slightly decreased from her baseline, but still relatively stable. improved, but still significantly decreased. Upon further review of her chart, her platelet count is relatively unchanged since at least January 2011 between 70 and 90. The remainder of her laboratory work is either negative or within normal limits. The most likely etiology is secondary to cirrhosis and mild splenomegaly as indicated by CT scan. No further intervention is needed at this time. Return to clinic in 3 months with repeat laboratory work and then in 6 months for laboratory work and further evaluation. 2. Anemia: Improved. Patient's hemoglobin is within normal limits. The remainder of her blood work is also negative or within normal limits.   Patient expressed understanding and was in agreement with this plan. She also understands that She can call clinic at any time with any questions, concerns, or complaints.     [name], MD   02/24/2015 5:48 PM

## 2015-02-25 IMAGING — CT CT ABDOMEN W/ CM
2 of 5 series · 16 of 46 positions shown, 18 images · IV contrast (isovue)
Comparison: Ultrasound 06/25/2014 common

CLINICAL DATA: Cirrhosis and splenomegaly. Short of breath and
weakness.

EXAM:
CT ABDOMEN WITH CONTRAST
TECHNIQUE: Multidetector CT imaging of the abdomen was performed using the
standard protocol following bolus administration of intravenous
contrast.
CONTRAST:  100 mL Isovue

[Series 2: routine abd pel with · axial · 0.74mm/px · z∈[-1652,-1412]mm · 13 of 56 slices shown, 15 images]
[im 4/56  soft-tissue]
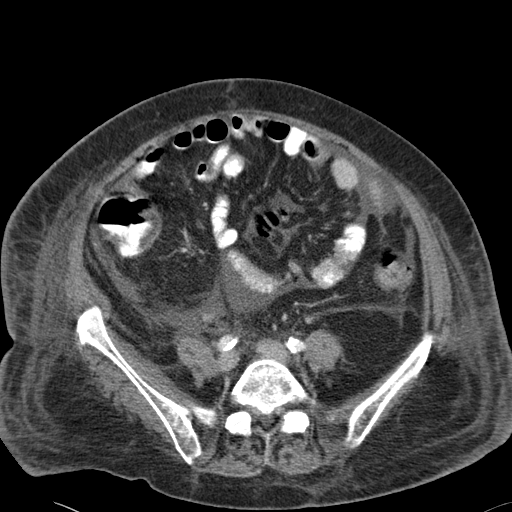
[im 4/56  bone]
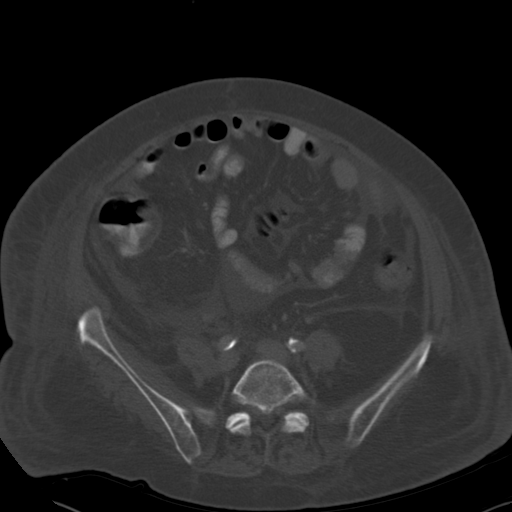
[im 7/56  soft-tissue]
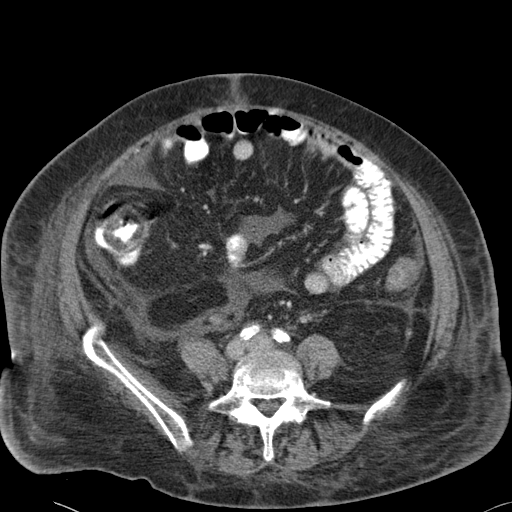
[im 11/56  soft-tissue]
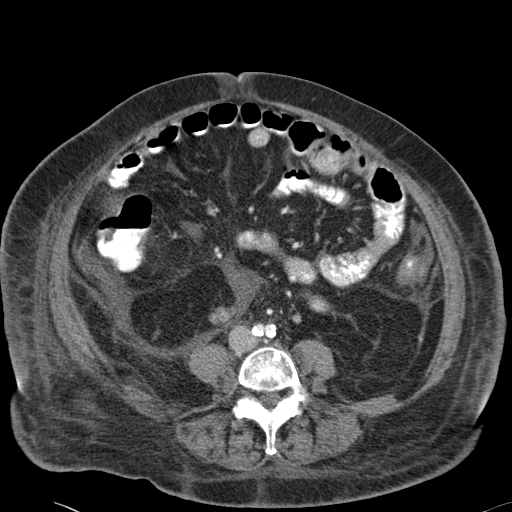
[im 18/56  soft-tissue]
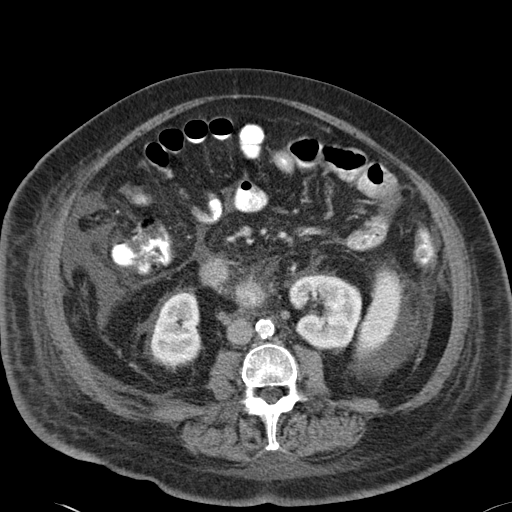
[im 21/56  soft-tissue]
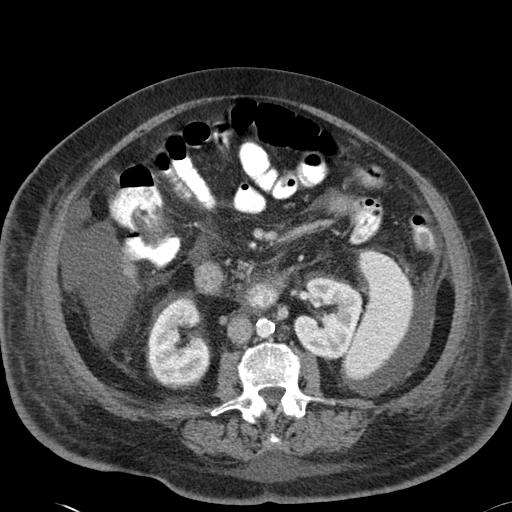
[im 25/56  soft-tissue]
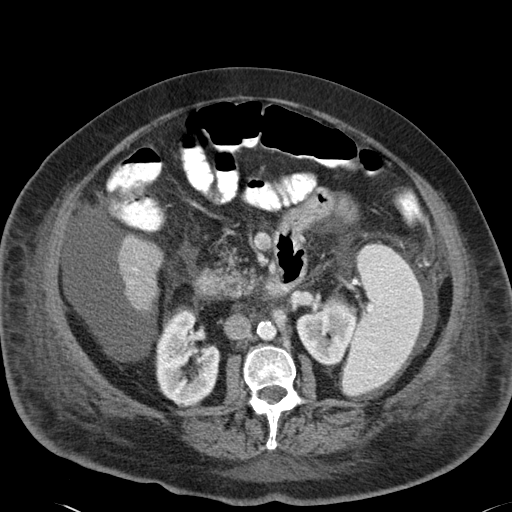
[im 28/56  soft-tissue]
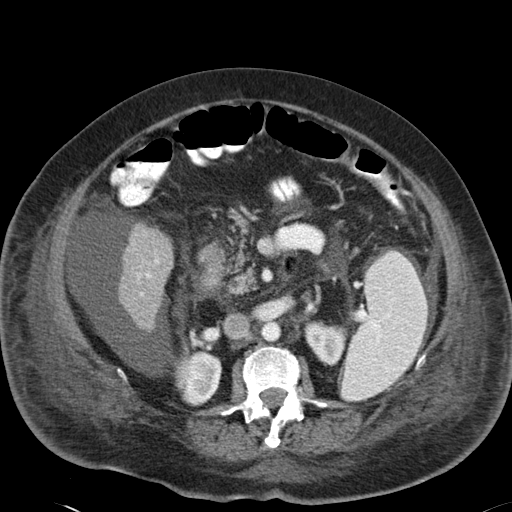
[im 31/56  soft-tissue]
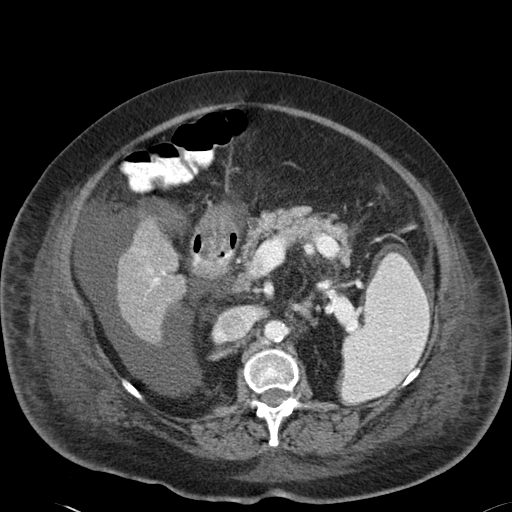
[im 35/56  soft-tissue]
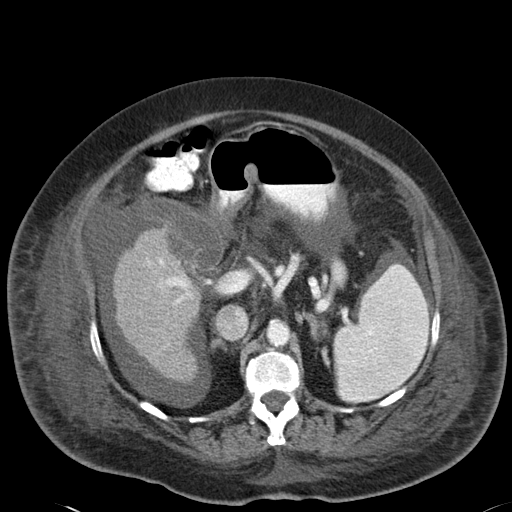
[im 35/56  bone]
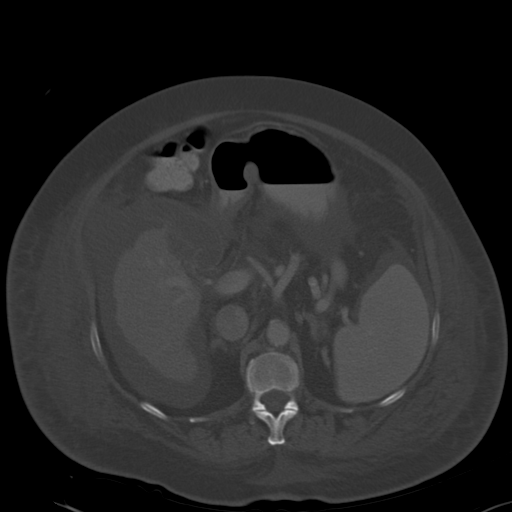
[im 38/56  soft-tissue]
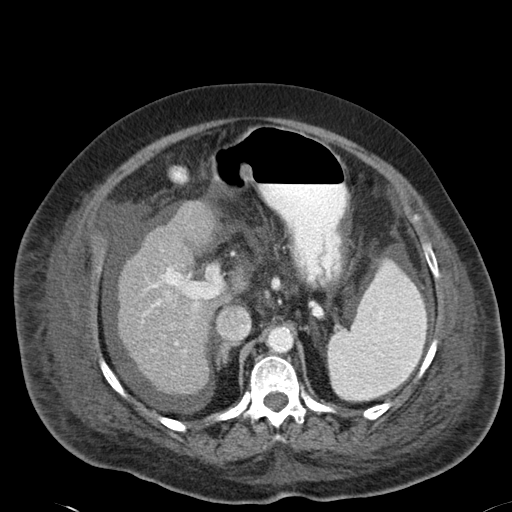
[im 45/56  soft-tissue]
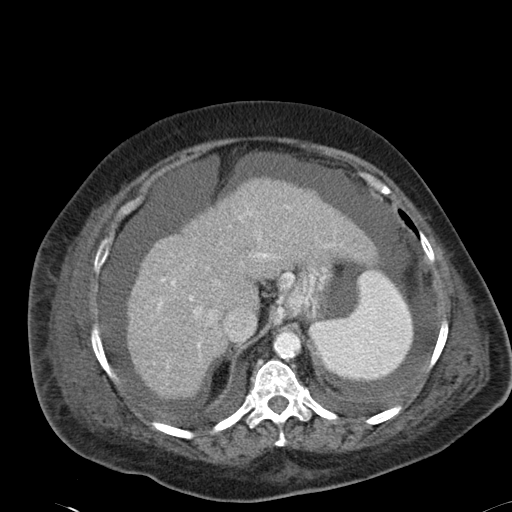
[im 49/56  soft-tissue]
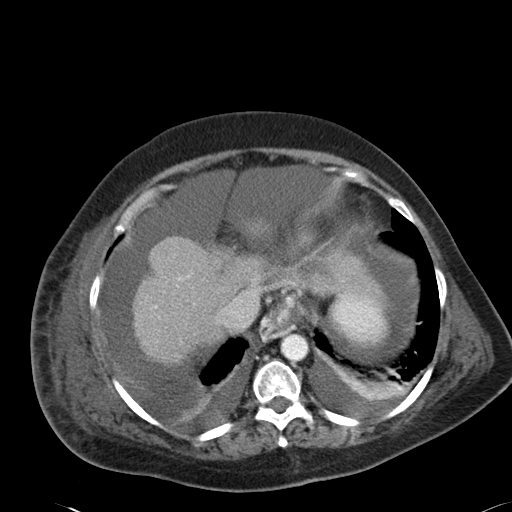
[im 52/56  soft-tissue]
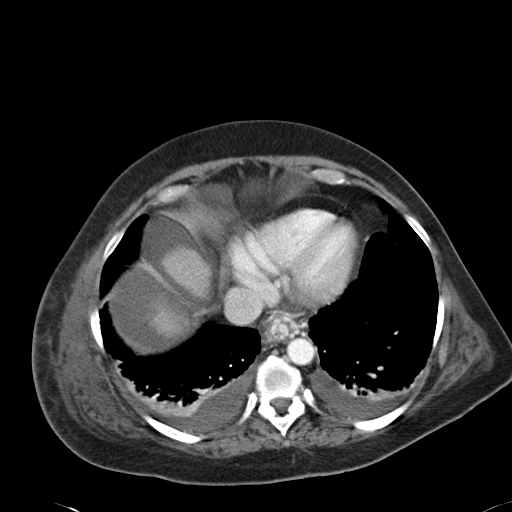

[Series 5: cor routine abd pel with · coronal · 0.74mm/px · 3 of 153 slices shown]
[im 51/153  soft-tissue]
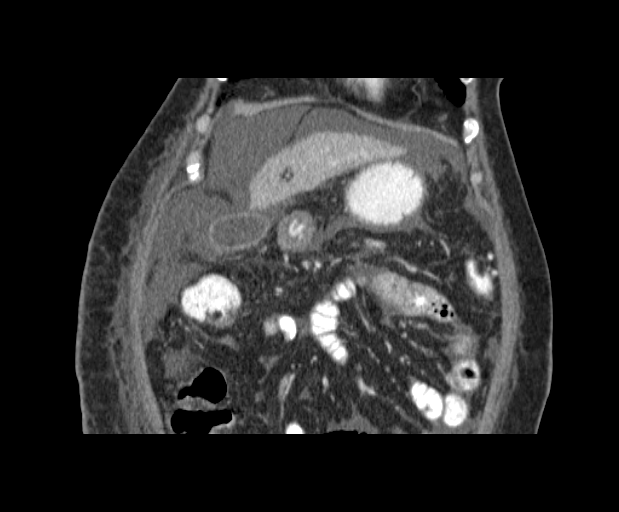
[im 68/153  soft-tissue]
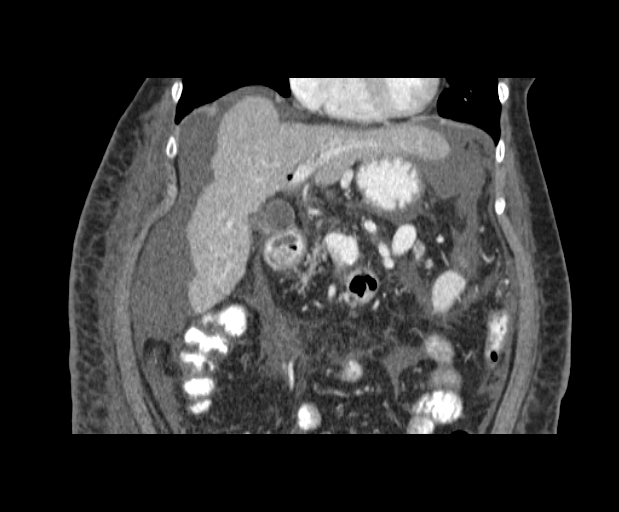
[im 85/153  soft-tissue]
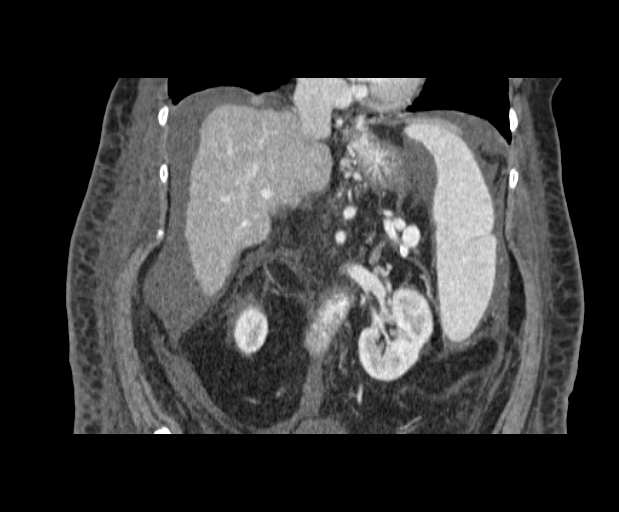

[16 of 46 positions shown; findings below may reference images not displayed]

FINDINGS: Lower chest: No pericardial fluid. There are bilateral pleural
effusions with mild passive atelectasis.

Hepatobiliary: Liver as a shrunken nodular contour. The caudate lobe
is mildly enlarged. The portal veins are patent. There is
pneumobilia within the left hepatic lobe consistent with prior
sphincterotomy. There is a moderate volume of ascites surrounding
the liver and spleen. Splenic vein is patent. There are venous
collaterals with gastric and paraesophageal varices noted.

Pancreas: Pancreas is atrophic. No pancreatic inflammation
identified.

Spleen: Spleen is mildly enlarged measuring 16 mm craniocaudad
dimension.

Adrenals/urinary tract: Adrenal glands are normal. Kidneys enhance
symmetrically. Proximal ureters are normal.

Stomach/Bowel: The stomach and limited view of the small bowel and
colon are unremarkable.

Vascular/Lymphatic: aorta is normal caliber with mild intimal
calcification. No periportal retroperitoneal lymphadenopathy.

Musculoskeletal: Limited view of the skeleton demonstrates no
aggressive osseous lesion.

Other: No free air
IMPRESSION: 1. Morphologic changes consists with cirrhosis.
2. Evidence of portal hypertension with moderate volume ascites and
paraesophageal varices. Mild splenomegaly.
3. Bilateral pleural effusions and basilar atelectasis.
4. Mild pneumobilia presumably related to prior sphincterotomy.

## 2015-05-14 ENCOUNTER — Inpatient Hospital Stay: Payer: Medicare Other | Attending: Oncology

## 2015-05-14 DIAGNOSIS — D696 Thrombocytopenia, unspecified: Secondary | ICD-10-CM | POA: Insufficient documentation

## 2015-05-14 LAB — CBC WITH DIFFERENTIAL/PLATELET
BASOS PCT: 1 %
Basophils Absolute: 0 10*3/uL (ref 0–0.1)
EOS PCT: 3 %
Eosinophils Absolute: 0.1 10*3/uL (ref 0–0.7)
HCT: 35.5 % (ref 35.0–47.0)
Hemoglobin: 11.4 g/dL — ABNORMAL LOW (ref 12.0–16.0)
Lymphocytes Relative: 11 %
Lymphs Abs: 0.5 10*3/uL — ABNORMAL LOW (ref 1.0–3.6)
MCH: 25.2 pg — ABNORMAL LOW (ref 26.0–34.0)
MCHC: 32 g/dL (ref 32.0–36.0)
MCV: 78.9 fL — ABNORMAL LOW (ref 80.0–100.0)
MONO ABS: 0.3 10*3/uL (ref 0.2–0.9)
Monocytes Relative: 8 %
Neutro Abs: 3.3 10*3/uL (ref 1.4–6.5)
Neutrophils Relative %: 77 %
PLATELETS: 54 10*3/uL — AB (ref 150–440)
RBC: 4.5 MIL/uL (ref 3.80–5.20)
RDW: 14.7 % — AB (ref 11.5–14.5)
WBC: 4.2 10*3/uL (ref 3.6–11.0)

## 2015-08-13 ENCOUNTER — Inpatient Hospital Stay: Payer: Medicare Other | Attending: Oncology

## 2015-08-13 ENCOUNTER — Inpatient Hospital Stay: Payer: Medicare Other | Admitting: Oncology

## 2015-09-03 ENCOUNTER — Inpatient Hospital Stay: Payer: Medicare Other | Attending: Oncology

## 2015-09-03 ENCOUNTER — Inpatient Hospital Stay (HOSPITAL_BASED_OUTPATIENT_CLINIC_OR_DEPARTMENT_OTHER): Payer: Medicare Other | Admitting: Oncology

## 2015-09-03 VITALS — BP 120/65 | HR 88 | Temp 98.0°F | Resp 18 | Wt 117.3 lb

## 2015-09-03 DIAGNOSIS — J449 Chronic obstructive pulmonary disease, unspecified: Secondary | ICD-10-CM

## 2015-09-03 DIAGNOSIS — R161 Splenomegaly, not elsewhere classified: Secondary | ICD-10-CM | POA: Insufficient documentation

## 2015-09-03 DIAGNOSIS — K7460 Unspecified cirrhosis of liver: Secondary | ICD-10-CM

## 2015-09-03 DIAGNOSIS — Z87891 Personal history of nicotine dependence: Secondary | ICD-10-CM | POA: Insufficient documentation

## 2015-09-03 DIAGNOSIS — D696 Thrombocytopenia, unspecified: Secondary | ICD-10-CM | POA: Diagnosis not present

## 2015-09-03 DIAGNOSIS — E119 Type 2 diabetes mellitus without complications: Secondary | ICD-10-CM | POA: Diagnosis not present

## 2015-09-03 DIAGNOSIS — Z87442 Personal history of urinary calculi: Secondary | ICD-10-CM

## 2015-09-03 DIAGNOSIS — Z809 Family history of malignant neoplasm, unspecified: Secondary | ICD-10-CM | POA: Insufficient documentation

## 2015-09-03 DIAGNOSIS — K219 Gastro-esophageal reflux disease without esophagitis: Secondary | ICD-10-CM

## 2015-09-03 DIAGNOSIS — E039 Hypothyroidism, unspecified: Secondary | ICD-10-CM

## 2015-09-03 DIAGNOSIS — D649 Anemia, unspecified: Secondary | ICD-10-CM

## 2015-09-03 DIAGNOSIS — F329 Major depressive disorder, single episode, unspecified: Secondary | ICD-10-CM | POA: Insufficient documentation

## 2015-09-03 LAB — CBC WITH DIFFERENTIAL/PLATELET
BASOS PCT: 1 %
Basophils Absolute: 0 10*3/uL (ref 0–0.1)
Eosinophils Absolute: 0.1 10*3/uL (ref 0–0.7)
Eosinophils Relative: 3 %
HEMATOCRIT: 33.9 % — AB (ref 35.0–47.0)
HEMOGLOBIN: 11.2 g/dL — AB (ref 12.0–16.0)
LYMPHS ABS: 0.4 10*3/uL — AB (ref 1.0–3.6)
Lymphocytes Relative: 10 %
MCH: 25.9 pg — AB (ref 26.0–34.0)
MCHC: 32.9 g/dL (ref 32.0–36.0)
MCV: 78.7 fL — ABNORMAL LOW (ref 80.0–100.0)
Monocytes Absolute: 0.4 10*3/uL (ref 0.2–0.9)
Monocytes Relative: 8 %
NEUTROS ABS: 3.6 10*3/uL (ref 1.4–6.5)
NEUTROS PCT: 78 %
Platelets: 53 10*3/uL — ABNORMAL LOW (ref 150–440)
RBC: 4.31 MIL/uL (ref 3.80–5.20)
RDW: 15 % — ABNORMAL HIGH (ref 11.5–14.5)
WBC: 4.5 10*3/uL (ref 3.6–11.0)

## 2015-09-03 NOTE — Progress Notes (Signed)
[facility]  Telephone:(336) [phone] Fax:(336) [phone]  ID: KIALA FARAJ OB: 19-Jun-1946  MR#: [id number]  AOZ#:[id number]  Patient Care Team: Kandyce Rud, MD as PCP - General (Family Medicine)  CHIEF COMPLAINT:  Chief Complaint  Patient presents with  . thrombocytopenia    INTERVAL HISTORY: Patient returns to clinic today for repeat laboratory work and further evaluation. She denies any easy bleeding or bruising. She denies any fevers. She has a fair appetite, but denies weight loss. She has no neurologic complaints. She denies any nausea, vomiting, constipation, or diarrhea. She has no melena or hematochezia. She has no urinary complaints. Patient offers no specific complaints today.  REVIEW OF SYSTEMS:   Review of Systems  Constitutional: Negative.   Respiratory: Negative.   Cardiovascular: Negative.   Gastrointestinal: Negative.   Genitourinary: Negative.   Endo/Heme/Allergies: Negative.     As per HPI. Otherwise, a complete review of systems is negatve.  PAST MEDICAL HISTORY: Past Medical History  Diagnosis Date  . Cirrhosis of liver not due to alcohol   . Diabetes   . Hypothyroidism   . Depression   . COPD (chronic obstructive pulmonary disease)   . GERD (gastroesophageal reflux disease)   . Nephrolithiasis   . ITP (idiopathic thrombocytopenic purpura)     PAST SURGICAL HISTORY: Past Surgical History  Procedure Laterality Date  . Tonsillectomy and adenoidectomy    . Lumbar disc surgery    . Cataract extraction      FAMILY HISTORY Family History  Problem Relation Age of Onset  . Alzheimer's disease Mother   . COPD Mother   . Cancer Mother   . COPD Father   . Diabetes Mellitus II Father   . Heart attack Father        ADVANCED DIRECTIVES:    HEALTH MAINTENANCE: Social History  Substance Use Topics  . Smoking status: Former Smoker -- 25 years    Types: Cigarettes  . Smokeless tobacco: Never Used  . Alcohol Use: No       Colonoscopy:  PAP:  Bone density:  Lipid panel:  Allergies  Allergen Reactions  . Amoxicillin-Pot Clavulanate Other (See Comments)  . Codeine   . Erythromycin   . Metformin And Related     GI bleed  . Morphine And Related   . Morphine Sulfate Er Beads Other (See Comments)    caused severe cramping   . Omeprazole Other (See Comments)    GI side effects  . Spiriva Handihaler [Tiotropium Bromide Monohydrate] Swelling    Tongue  . Statins Other (See Comments)  . Tetracyclines & Related   . Ace Inhibitors Rash    Current Outpatient Prescriptions  Medication Sig Dispense Refill  . albuterol (ACCUNEB) 1.25 MG/3ML nebulizer solution Take 1 ampule by nebulization every 6 (six) hours as needed for wheezing.    Marland Kitchen FLUoxetine (PROZAC) 20 MG tablet Take 20 mg by mouth daily.    . Fluticasone Furoate-Vilanterol (BREO ELLIPTA) 100-25 MCG/INH AEPB Inhale 1 puff into the lungs daily.    . furosemide (LASIX) 20 MG tablet Take 20 mg by mouth 2 (two) times daily.    Marland Kitchen glyBURIDE micronized (GLYNASE) 6 MG tablet Take 6 mg by mouth 2 (two) times daily before a meal.    . levothyroxine (SYNTHROID, LEVOTHROID) 75 MCG tablet Take by mouth.    . pantoprazole (PROTONIX) 40 MG tablet Take 40 mg by mouth 2 (two) times daily.    Marland Kitchen spironolactone (ALDACTONE) 50 MG tablet Take 50 mg  by mouth daily.      No current facility-administered medications for this visit.    OBJECTIVE: Filed Vitals:   09/03/15 1040  BP: 120/65  Pulse: 88  Temp: 98 F (36.7 C)  Resp: 18     Body mass index is 19.52 kg/(m^2).    ECOG FS:0 - Asymptomatic  General: Well-developed, well-nourished, no acute distress. Eyes: Pink conjunctiva, anicteric sclera. Lungs: Clear to auscultation bilaterally. Heart: Regular rate and rhythm. No rubs, murmurs, or gallops. Abdomen: Soft, nontender, nondistended. No organomegaly noted, normoactive bowel sounds. Musculoskeletal: No edema, cyanosis, or clubbing. Neuro: Alert, answering  all questions appropriately. Cranial nerves grossly intact. Skin: No rashes or petechiae noted. Psych: Normal affect.   LAB RESULTS:  Lab Results  Component Value Date   NA 139 07/10/2014   K 3.9 07/10/2014   CL 95* 07/02/2014   CO2 41* 07/02/2014   GLUCOSE 196* 07/02/2014   BUN 20 07/10/2014   CREATININE 0.7 07/10/2014   CALCIUM 8.7 07/02/2014   PROT 5.1* 07/01/2014   ALBUMIN 2.6* 07/01/2014   AST 24 07/23/2014   ALT 23 07/23/2014   ALKPHOS 58 07/23/2014   BILITOT 0.4 07/01/2014   GFRNONAA >60 07/02/2014   GFRAA >60 07/02/2014    Lab Results  Component Value Date   WBC 4.5 09/03/2015   NEUTROABS 3.6 09/03/2015   HGB 11.2* 09/03/2015   HCT 33.9* 09/03/2015   MCV 78.7* 09/03/2015   PLT 53* 09/03/2015     STUDIES: No results found.  ASSESSMENT: Thrombocytopenia.  PLAN:    1. Thrombocytopenia: Patient's platelet count is slightly decreased, but still stable. Her platelet count has been as low as 36 and as high as 81 since December 2015. The most likely etiology is secondary to cirrhosis and mild splenomegaly as indicated by CT scan. No further intervention is needed at this time. Return to clinic in 3 months with repeat laboratory work and then in 6 months for laboratory work and further evaluation. 2. Anemia: Mild. 11.2 today. Monitor   Patient expressed understanding and was in agreement with this plan. She also understands that She can call clinic at any time with any questions, concerns, or complaints.    Genevie Cheshire, NP   09/03/2015 1:34 PM    Patient was seen and evaluated independently and I agree with the assessment and plan as dictated above.  [name], MD 09/06/2015 7:42 AM

## 2015-09-03 NOTE — Progress Notes (Signed)
Patient does not offer any problems today.  

## 2015-12-03 ENCOUNTER — Inpatient Hospital Stay: Payer: Medicare Other | Attending: Oncology

## 2015-12-03 DIAGNOSIS — D696 Thrombocytopenia, unspecified: Secondary | ICD-10-CM | POA: Diagnosis present

## 2015-12-03 LAB — CBC WITH DIFFERENTIAL/PLATELET
Basophils Absolute: 0 10*3/uL (ref 0–0.1)
Basophils Relative: 1 %
EOS ABS: 0.1 10*3/uL (ref 0–0.7)
EOS PCT: 3 %
HCT: 33 % — ABNORMAL LOW (ref 35.0–47.0)
Hemoglobin: 10.9 g/dL — ABNORMAL LOW (ref 12.0–16.0)
LYMPHS ABS: 0.4 10*3/uL — AB (ref 1.0–3.6)
Lymphocytes Relative: 10 %
MCH: 25.9 pg — AB (ref 26.0–34.0)
MCHC: 33.1 g/dL (ref 32.0–36.0)
MCV: 78.5 fL — ABNORMAL LOW (ref 80.0–100.0)
MONO ABS: 0.3 10*3/uL (ref 0.2–0.9)
MONOS PCT: 8 %
Neutro Abs: 2.7 10*3/uL (ref 1.4–6.5)
Neutrophils Relative %: 78 %
PLATELETS: 51 10*3/uL — AB (ref 150–440)
RBC: 4.2 MIL/uL (ref 3.80–5.20)
RDW: 15.2 % — AB (ref 11.5–14.5)
WBC: 3.5 10*3/uL — AB (ref 3.6–11.0)

## 2016-03-02 ENCOUNTER — Other Ambulatory Visit: Payer: Self-pay | Admitting: *Deleted

## 2016-03-02 DIAGNOSIS — D696 Thrombocytopenia, unspecified: Secondary | ICD-10-CM

## 2016-03-03 ENCOUNTER — Encounter: Payer: Self-pay | Admitting: Oncology

## 2016-03-03 ENCOUNTER — Inpatient Hospital Stay: Payer: Medicare Other | Attending: Oncology

## 2016-03-03 ENCOUNTER — Inpatient Hospital Stay (HOSPITAL_BASED_OUTPATIENT_CLINIC_OR_DEPARTMENT_OTHER): Payer: Medicare Other | Admitting: Oncology

## 2016-03-03 VITALS — BP 122/63 | HR 101 | Temp 95.6°F | Resp 22 | Ht 65.0 in | Wt 115.1 lb

## 2016-03-03 DIAGNOSIS — Z79899 Other long term (current) drug therapy: Secondary | ICD-10-CM

## 2016-03-03 DIAGNOSIS — D696 Thrombocytopenia, unspecified: Secondary | ICD-10-CM | POA: Insufficient documentation

## 2016-03-03 DIAGNOSIS — Z87442 Personal history of urinary calculi: Secondary | ICD-10-CM | POA: Insufficient documentation

## 2016-03-03 DIAGNOSIS — R0602 Shortness of breath: Secondary | ICD-10-CM | POA: Diagnosis not present

## 2016-03-03 DIAGNOSIS — K7460 Unspecified cirrhosis of liver: Secondary | ICD-10-CM | POA: Insufficient documentation

## 2016-03-03 DIAGNOSIS — Z87891 Personal history of nicotine dependence: Secondary | ICD-10-CM

## 2016-03-03 DIAGNOSIS — Z809 Family history of malignant neoplasm, unspecified: Secondary | ICD-10-CM | POA: Insufficient documentation

## 2016-03-03 DIAGNOSIS — E039 Hypothyroidism, unspecified: Secondary | ICD-10-CM | POA: Insufficient documentation

## 2016-03-03 DIAGNOSIS — F329 Major depressive disorder, single episode, unspecified: Secondary | ICD-10-CM | POA: Diagnosis not present

## 2016-03-03 DIAGNOSIS — D649 Anemia, unspecified: Secondary | ICD-10-CM

## 2016-03-03 DIAGNOSIS — E119 Type 2 diabetes mellitus without complications: Secondary | ICD-10-CM

## 2016-03-03 DIAGNOSIS — K219 Gastro-esophageal reflux disease without esophagitis: Secondary | ICD-10-CM | POA: Insufficient documentation

## 2016-03-03 DIAGNOSIS — J449 Chronic obstructive pulmonary disease, unspecified: Secondary | ICD-10-CM | POA: Insufficient documentation

## 2016-03-03 LAB — CBC WITH DIFFERENTIAL/PLATELET
Basophils Absolute: 0 K/uL (ref 0–0.1)
Basophils Relative: 1 %
Eosinophils Absolute: 0.1 K/uL (ref 0–0.7)
Eosinophils Relative: 3 %
HCT: 29.7 % — ABNORMAL LOW (ref 35.0–47.0)
Hemoglobin: 9.9 g/dL — ABNORMAL LOW (ref 12.0–16.0)
Lymphocytes Relative: 9 %
Lymphs Abs: 0.3 K/uL — ABNORMAL LOW (ref 1.0–3.6)
MCH: 25.7 pg — ABNORMAL LOW (ref 26.0–34.0)
MCHC: 33.4 g/dL (ref 32.0–36.0)
MCV: 76.8 fL — ABNORMAL LOW (ref 80.0–100.0)
Monocytes Absolute: 0.3 K/uL (ref 0.2–0.9)
Monocytes Relative: 8 %
Neutro Abs: 2.9 K/uL (ref 1.4–6.5)
Neutrophils Relative %: 79 %
Platelets: 53 K/uL — ABNORMAL LOW (ref 150–440)
RBC: 3.87 MIL/uL (ref 3.80–5.20)
RDW: 15.6 % — ABNORMAL HIGH (ref 11.5–14.5)
WBC: 3.7 K/uL (ref 3.6–11.0)

## 2016-03-03 NOTE — Progress Notes (Signed)
No major changes since last visit.  Pt on 2L O2 via [place].  SOB related to copd no other concerns today.

## 2016-03-03 NOTE — Progress Notes (Signed)
[facility]  Telephone:(336) [phone] Fax:(336) [phone]  ID: YANILL SELVAGGIO OB: 1946/03/02  MR#: [id number]  KFM#:[id number]  Patient Care Team: Kandyce Rud, MD as PCP - General (Family Medicine)  CHIEF COMPLAINT: Thrombocytopenia.  INTERVAL HISTORY: Patient returns to clinic today for repeat laboratory work and further evaluation. She denies any easy bleeding or bruising. She denies any fevers. She has chronic shortness of breath requiring oxygen, but denies chest pain, cough, or hemoptysis. She has a fair appetite, but denies weight loss. She has no neurologic complaints. She denies any nausea, vomiting, constipation, or diarrhea. She has no melena or hematochezia. She has no urinary complaints. Patient offers no specific complaints today.  REVIEW OF SYSTEMS:   Review of Systems  Constitutional: Negative.  Negative for fever, malaise/fatigue and weight loss.  Respiratory: Positive for shortness of breath. Negative for cough and hemoptysis.   Cardiovascular: Negative.  Negative for chest pain.  Gastrointestinal: Negative.  Negative for abdominal pain, blood in stool, melena, nausea and vomiting.  Genitourinary: Negative.   Musculoskeletal: Negative.   Neurological: Negative.  Negative for weakness.  Endo/Heme/Allergies: Does not bruise/bleed easily.  Psychiatric/Behavioral: Negative.     As per HPI. Otherwise, a complete review of systems is negatve.  PAST MEDICAL HISTORY: Past Medical History:  Diagnosis Date  . Cirrhosis of liver not due to alcohol (HCC)   . COPD (chronic obstructive pulmonary disease) (HCC)   . Depression   . Diabetes (HCC)   . GERD (gastroesophageal reflux disease)   . Hypothyroidism   . ITP (idiopathic thrombocytopenic purpura)   . Nephrolithiasis     PAST SURGICAL HISTORY: Past Surgical History:  Procedure Laterality Date  . CATARACT EXTRACTION    . LUMBAR DISC SURGERY    . TONSILLECTOMY AND ADENOIDECTOMY      FAMILY  HISTORY Family History  Problem Relation Age of Onset  . Alzheimer's disease Mother   . COPD Mother   . Cancer Mother   . COPD Father   . Diabetes Mellitus II Father   . Heart attack Father        ADVANCED DIRECTIVES:    HEALTH MAINTENANCE: Social History  Substance Use Topics  . Smoking status: Former Smoker    Years: 25.00    Types: Cigarettes  . Smokeless tobacco: Never Used  . Alcohol use No     Colonoscopy:  PAP:  Bone density:  Lipid panel:  Allergies  Allergen Reactions  . Amoxicillin-Pot Clavulanate Other (See Comments)  . Codeine   . Erythromycin   . Levofloxacin Other (See Comments)    Jittery, nausea  . Metformin And Related     GI bleed  . Morphine And Related   . Morphine Sulfate Er Beads Other (See Comments)    caused severe cramping   . Omeprazole Other (See Comments)    GI side effects  . Spiriva Handihaler [Tiotropium Bromide Monohydrate] Swelling    Tongue  . Statins Other (See Comments)  . Tetracyclines & Related   . Ace Inhibitors Rash    Current Outpatient Prescriptions  Medication Sig Dispense Refill  . albuterol (ACCUNEB) 1.25 MG/3ML nebulizer solution Take 1 ampule by nebulization every 6 (six) hours as needed for wheezing.    Marland Kitchen FLUoxetine (PROZAC) 20 MG tablet Take 20 mg by mouth daily.    . fluticasone (FLONASE) 50 MCG/ACT nasal spray PLACE 2 SPRAYS INTO BOTH NOSTRILS ONCE DAILY.    . furosemide (LASIX) 20 MG tablet Take 20 mg by mouth 2 (two)  times daily.    Marland Kitchen glyBURIDE micronized (GLYNASE) 6 MG tablet Take 6 mg by mouth 2 (two) times daily before a meal.    . pantoprazole (PROTONIX) 40 MG tablet Take 40 mg by mouth 2 (two) times daily.    Marland Kitchen spironolactone (ALDACTONE) 50 MG tablet Take 50 mg by mouth daily.     Marland Kitchen levothyroxine (SYNTHROID, LEVOTHROID) 75 MCG tablet      No current facility-administered medications for this visit.     OBJECTIVE: Vitals:   03/03/16 1016  BP: 122/63  Pulse: (!) 101  Resp: (!) 22  Temp: (!)  95.6 F (35.3 C)     Body mass index is 19.15 kg/m.    ECOG FS:0 - Asymptomatic  General: Well-developed, well-nourished, no acute distress. Eyes: Pink conjunctiva, anicteric sclera. Lungs: Clear to auscultation bilaterally. Heart: Regular rate and rhythm. No rubs, murmurs, or gallops. Abdomen: Soft, nontender, nondistended. No organomegaly noted, normoactive bowel sounds. Musculoskeletal: No edema, cyanosis, or clubbing. Neuro: Alert, answering all questions appropriately. Cranial nerves grossly intact. Skin: No rashes or petechiae noted. Psych: Normal affect.   LAB RESULTS:  Lab Results  Component Value Date   NA 139 07/10/2014   K 3.9 07/10/2014   CL 95 (L) 07/02/2014   CO2 41 (HH) 07/02/2014   GLUCOSE 196 (H) 07/02/2014   BUN 20 07/10/2014   CREATININE 0.7 07/10/2014   CALCIUM 8.7 07/02/2014   PROT 5.1 (L) 07/01/2014   ALBUMIN 2.6 (L) 07/01/2014   AST 24 07/23/2014   ALT 23 07/23/2014   ALKPHOS 58 07/23/2014   BILITOT 0.4 07/01/2014   GFRNONAA >60 07/02/2014   GFRAA >60 07/02/2014    Lab Results  Component Value Date   WBC 3.7 03/03/2016   NEUTROABS 2.9 03/03/2016   HGB 9.9 (L) 03/03/2016   HCT 29.7 (L) 03/03/2016   MCV 76.8 (L) 03/03/2016   PLT 53 (L) 03/03/2016     STUDIES: No results found.  ASSESSMENT: Thrombocytopenia.  PLAN:    1. Thrombocytopenia: Patient's platelet count is slightly decreased, but still stable. Her platelet count has been as low as 36 and as high as 81 since December 2015. The most likely etiology is secondary to cirrhosis and mild splenomegaly as indicated by CT scan. No further intervention is needed at this time. Return to clinic in 3 months with repeat laboratory work and then in 6 months for laboratory work and further evaluation. 2. Anemia: Mild. 9.9 today. Monitor   Patient expressed understanding and was in agreement with this plan. She also understands that She can call clinic at any time with any questions, concerns,  or complaints.    [name], MD   03/03/2016 11:49 AM

## 2016-06-03 ENCOUNTER — Inpatient Hospital Stay: Payer: Medicare Other

## 2016-09-04 NOTE — Progress Notes (Signed)
[facility]  Telephone:([phone] [phone]  ID: Angela Austin OB: 1945-11-24  MR#: [id number]  AOZ#:[id number]  Patient Care Team: Kandyce Rud, MD as PCP - General (Family Medicine)  CHIEF COMPLAINT: Thrombocytopenia.  INTERVAL HISTORY: Patient returns to clinic today for repeat laboratory work and further evaluation. She continues to feel well and is at her baseline. She denies any easy bleeding or bruising. She denies any fevers. She has chronic shortness of breath requiring oxygen, but denies chest pain, cough, or hemoptysis. She has a fair appetite, but denies weight loss. She has no neurologic complaints. She denies any nausea, vomiting, constipation, or diarrhea. She has no melena or hematochezia. She has no urinary complaints. Patient offers no further specific complaints today.  REVIEW OF SYSTEMS:   Review of Systems  Constitutional: Negative.  Negative for fever, malaise/fatigue and weight loss.  Respiratory: Positive for shortness of breath. Negative for cough and hemoptysis.   Cardiovascular: Negative.  Negative for chest pain and leg swelling.  Gastrointestinal: Negative.  Negative for abdominal pain, blood in stool, melena, nausea and vomiting.  Genitourinary: Negative.   Musculoskeletal: Negative.   Neurological: Negative.  Negative for sensory change and weakness.  Endo/Heme/Allergies: Does not bruise/bleed easily.  Psychiatric/Behavioral: Negative.  The patient is not nervous/anxious.     As per HPI. Otherwise, a complete review of systems is negative.  PAST MEDICAL HISTORY: Past Medical History:  Diagnosis Date  . Cirrhosis of liver not due to alcohol (HCC)   . COPD (chronic obstructive pulmonary disease) (HCC)   . Depression   . Diabetes (HCC)   . GERD (gastroesophageal reflux disease)   . Hypothyroidism   . ITP (idiopathic thrombocytopenic purpura)   . Nephrolithiasis     PAST SURGICAL HISTORY: Past Surgical  History:  Procedure Laterality Date  . CATARACT EXTRACTION    . LUMBAR DISC SURGERY    . TONSILLECTOMY AND ADENOIDECTOMY      FAMILY HISTORY Family History  Problem Relation Age of Onset  . Alzheimer's disease Mother   . COPD Mother   . Cancer Mother   . COPD Father   . Diabetes Mellitus II Father   . Heart attack Father        ADVANCED DIRECTIVES:    HEALTH MAINTENANCE: Social History  Substance Use Topics  . Smoking status: Former Smoker    Years: 25.00    Types: Cigarettes  . Smokeless tobacco: Never Used  . Alcohol use No     Colonoscopy:  PAP:  Bone density:  Lipid panel:  Allergies  Allergen Reactions  . Metformin Diarrhea  . Amoxicillin-Pot Clavulanate Other (See Comments)    Other reaction(s): Unknown  . Codeine   . Erythromycin   . Erythromycin Ethylsuccinate     Other reaction(s): Unknown  . Levofloxacin Other (See Comments)    Jittery, nausea  . Metformin And Related     GI bleed  . Morphine Other (See Comments) and Nausea And Vomiting    caused severe cramping   . Morphine And Related   . Morphine Sulfate Er Beads Other (See Comments)    caused severe cramping   . Omeprazole Other (See Comments)    GI side effects GI side effects  . Spiriva Handihaler [Tiotropium Bromide Monohydrate] Swelling    Tongue  . Statins Other (See Comments)    Other reaction(s): Muscle Pain  . Tetracyclines & Related   . Ace Inhibitors Rash    Current Outpatient Prescriptions  Medication Sig Dispense  Refill  . albuterol (ACCUNEB) 1.25 MG/3ML nebulizer solution Take 1 ampule by nebulization every 6 (six) hours as needed for wheezing.    Marland Kitchen. FLUoxetine (PROZAC) 20 MG tablet Take 20 mg by mouth daily.    . fluticasone (FLONASE) 50 MCG/ACT nasal spray PLACE 2 SPRAYS INTO BOTH NOSTRILS ONCE DAILY.    . furosemide (LASIX) 20 MG tablet Take 20 mg by mouth 2 (two) times daily.    Marland Kitchen. glyBURIDE micronized (GLYNASE) 6 MG tablet Take 6 mg by mouth 2 (two) times daily  before a meal.    . levothyroxine (SYNTHROID, LEVOTHROID) 75 MCG tablet     . pantoprazole (PROTONIX) 40 MG tablet Take 40 mg by mouth 2 (two) times daily.    Marland Kitchen. spironolactone (ALDACTONE) 50 MG tablet Take 50 mg by mouth daily.      No current facility-administered medications for this visit.     OBJECTIVE: Vitals:   09/05/16 1055  BP: 124/69  Pulse: 96  Temp: 98.5 F (36.9 C)     Body mass index is 18.75 kg/m.    ECOG FS:0 - Asymptomatic  General: Well-developed, well-nourished, no acute distress. Eyes: Pink conjunctiva, anicteric sclera. Lungs: Clear to auscultation bilaterally. Heart: Regular rate and rhythm. No rubs, murmurs, or gallops. Abdomen: Soft, nontender, nondistended. No organomegaly noted, normoactive bowel sounds. Musculoskeletal: No edema, cyanosis, or clubbing. Neuro: Alert, answering all questions appropriately. Cranial nerves grossly intact. Skin: No rashes or petechiae noted. Psych: Normal affect.   LAB RESULTS:  Lab Results  Component Value Date   NA 139 07/10/2014   K 3.9 07/10/2014   CL 95 (L) 07/02/2014   CO2 41 (HH) 07/02/2014   GLUCOSE 196 (H) 07/02/2014   BUN 20 07/10/2014   CREATININE 0.7 07/10/2014   CALCIUM 8.7 07/02/2014   PROT 5.1 (L) 07/01/2014   ALBUMIN 2.6 (L) 07/01/2014   AST 24 07/23/2014   ALT 23 07/23/2014   ALKPHOS 58 07/23/2014   BILITOT 0.4 07/01/2014   GFRNONAA >60 07/02/2014   GFRAA >60 07/02/2014    Lab Results  Component Value Date   WBC 3.2 (L) 09/05/2016   NEUTROABS 2.4 09/05/2016   HGB 9.8 (L) 09/05/2016   HCT 30.4 (L) 09/05/2016   MCV 74.6 (L) 09/05/2016   PLT 61 (L) 09/05/2016     STUDIES: No results found.  ASSESSMENT: Thrombocytopenia.  PLAN:    1. Thrombocytopenia: Patient's platelet count is 61 today which is essentially her baseline. Her platelet count has been as low as 36 and as high as 81 since December 2015. Previously, the remainder of her laboratory work is either negative or within  normal limits. The most likely etiology is secondary to cirrhosis and mild splenomegaly as indicated by CT scan. No further intervention is needed at this time. Return to clinic in 6 months for laboratory work and further evaluation. 2. Anemia: Decreased, but stable. Monitor.  Patient expressed understanding and was in agreement with this plan. She also understands that She can call clinic at any time with any questions, concerns, or complaints.    Jeralyn Ruthsimothy J Finnegan, MD   09/07/2016 1:34 PM

## 2016-09-05 ENCOUNTER — Inpatient Hospital Stay: Payer: Medicare Other

## 2016-09-05 ENCOUNTER — Inpatient Hospital Stay: Payer: Medicare Other | Attending: Oncology | Admitting: Oncology

## 2016-09-05 VITALS — BP 124/69 | HR 96 | Temp 98.5°F | Wt 112.7 lb

## 2016-09-05 DIAGNOSIS — K7460 Unspecified cirrhosis of liver: Secondary | ICD-10-CM | POA: Diagnosis not present

## 2016-09-05 DIAGNOSIS — D649 Anemia, unspecified: Secondary | ICD-10-CM | POA: Insufficient documentation

## 2016-09-05 DIAGNOSIS — Z79899 Other long term (current) drug therapy: Secondary | ICD-10-CM

## 2016-09-05 DIAGNOSIS — Z809 Family history of malignant neoplasm, unspecified: Secondary | ICD-10-CM | POA: Diagnosis not present

## 2016-09-05 DIAGNOSIS — R161 Splenomegaly, not elsewhere classified: Secondary | ICD-10-CM | POA: Diagnosis not present

## 2016-09-05 DIAGNOSIS — K219 Gastro-esophageal reflux disease without esophagitis: Secondary | ICD-10-CM | POA: Diagnosis not present

## 2016-09-05 DIAGNOSIS — D696 Thrombocytopenia, unspecified: Secondary | ICD-10-CM | POA: Diagnosis present

## 2016-09-05 DIAGNOSIS — E039 Hypothyroidism, unspecified: Secondary | ICD-10-CM | POA: Diagnosis not present

## 2016-09-05 DIAGNOSIS — E119 Type 2 diabetes mellitus without complications: Secondary | ICD-10-CM | POA: Insufficient documentation

## 2016-09-05 DIAGNOSIS — Z87442 Personal history of urinary calculi: Secondary | ICD-10-CM | POA: Diagnosis not present

## 2016-09-05 DIAGNOSIS — Z87891 Personal history of nicotine dependence: Secondary | ICD-10-CM | POA: Insufficient documentation

## 2016-09-05 DIAGNOSIS — R0602 Shortness of breath: Secondary | ICD-10-CM | POA: Diagnosis not present

## 2016-09-05 DIAGNOSIS — J449 Chronic obstructive pulmonary disease, unspecified: Secondary | ICD-10-CM

## 2016-09-05 DIAGNOSIS — F329 Major depressive disorder, single episode, unspecified: Secondary | ICD-10-CM | POA: Insufficient documentation

## 2016-09-05 DIAGNOSIS — Z7952 Long term (current) use of systemic steroids: Secondary | ICD-10-CM | POA: Diagnosis not present

## 2016-09-05 LAB — CBC WITH DIFFERENTIAL/PLATELET
Basophils Absolute: 0 10*3/uL (ref 0–0.1)
Basophils Relative: 1 %
EOS PCT: 3 %
Eosinophils Absolute: 0.1 10*3/uL (ref 0–0.7)
HCT: 30.4 % — ABNORMAL LOW (ref 35.0–47.0)
Hemoglobin: 9.8 g/dL — ABNORMAL LOW (ref 12.0–16.0)
LYMPHS ABS: 0.4 10*3/uL — AB (ref 1.0–3.6)
LYMPHS PCT: 12 %
MCH: 24.2 pg — AB (ref 26.0–34.0)
MCHC: 32.4 g/dL (ref 32.0–36.0)
MCV: 74.6 fL — AB (ref 80.0–100.0)
Monocytes Absolute: 0.2 10*3/uL (ref 0.2–0.9)
Monocytes Relative: 7 %
Neutro Abs: 2.4 10*3/uL (ref 1.4–6.5)
Neutrophils Relative %: 77 %
PLATELETS: 61 10*3/uL — AB (ref 150–440)
RBC: 4.07 MIL/uL (ref 3.80–5.20)
RDW: 16 % — ABNORMAL HIGH (ref 11.5–14.5)
WBC: 3.2 10*3/uL — ABNORMAL LOW (ref 3.6–11.0)

## 2017-03-06 ENCOUNTER — Ambulatory Visit: Payer: Medicare Other | Admitting: Oncology

## 2017-03-06 ENCOUNTER — Other Ambulatory Visit: Payer: Medicare Other

## 2017-03-10 ENCOUNTER — Other Ambulatory Visit: Payer: Self-pay

## 2017-03-10 DIAGNOSIS — D696 Thrombocytopenia, unspecified: Secondary | ICD-10-CM

## 2017-03-12 NOTE — Progress Notes (Signed)
[facility]  Telephone:(336) [phone] [phone]  ID: Angela Austin OB: 07-30-1946  MR#: [id number]  AOZ#:[id number]  Patient Care Team: Kandyce Rud, MD as PCP - General (Family Medicine)  CHIEF COMPLAINT: Thrombocytopenia.  INTERVAL HISTORY: Patient returns to clinic today for repeat laboratory work and further evaluation. She continues to feel well and is at her baseline. She denies any easy bleeding or bruising. She denies any fevers. She has chronic shortness of breath requiring oxygen, but denies chest pain, cough, or hemoptysis. She has a fair appetite, but denies weight loss. She has no neurologic complaints. She denies any nausea, vomiting, constipation, or diarrhea. She has no melena or hematochezia. She has no urinary complaints. Patient offers no further specific complaints today.  REVIEW OF SYSTEMS:   Review of Systems  Constitutional: Positive for weight loss. Negative for fever and malaise/fatigue.  Respiratory: Positive for shortness of breath. Negative for cough and hemoptysis.   Cardiovascular: Negative.  Negative for chest pain and leg swelling.  Gastrointestinal: Negative.  Negative for abdominal pain, blood in stool, melena, nausea and vomiting.  Genitourinary: Negative.  Negative for hematuria.  Musculoskeletal: Negative.   Skin: Negative.  Negative for rash.  Neurological: Positive for weakness. Negative for sensory change.  Endo/Heme/Allergies: Does not bruise/bleed easily.  Psychiatric/Behavioral: Negative.  The patient is not nervous/anxious.     As per HPI. Otherwise, a complete review of systems is negative.  PAST MEDICAL HISTORY: Past Medical History:  Diagnosis Date  . Cirrhosis of liver not due to alcohol (HCC)   . COPD (chronic obstructive pulmonary disease) (HCC)   . Depression   . Diabetes (HCC)   . GERD (gastroesophageal reflux disease)   . Hypothyroidism   . ITP (idiopathic thrombocytopenic purpura)   .  Nephrolithiasis     PAST SURGICAL HISTORY: Past Surgical History:  Procedure Laterality Date  . CATARACT EXTRACTION    . LUMBAR DISC SURGERY    . TONSILLECTOMY AND ADENOIDECTOMY      FAMILY HISTORY Family History  Problem Relation Age of Onset  . Alzheimer's disease Mother   . COPD Mother   . Cancer Mother   . COPD Father   . Diabetes Mellitus II Father   . Heart attack Father        ADVANCED DIRECTIVES:    HEALTH MAINTENANCE: Social History  Substance Use Topics  . Smoking status: Former Smoker    Years: 25.00    Types: Cigarettes  . Smokeless tobacco: Never Used  . Alcohol use No     Colonoscopy:  PAP:  Bone density:  Lipid panel:  Allergies  Allergen Reactions  . Metformin Diarrhea  . Amoxicillin-Pot Clavulanate Other (See Comments)    Other reaction(s): Unknown  . Codeine   . Erythromycin   . Erythromycin Ethylsuccinate     Other reaction(s): Unknown  . Levofloxacin Other (See Comments)    Jittery, nausea  . Metformin And Related     GI bleed  . Morphine Other (See Comments) and Nausea And Vomiting    caused severe cramping   . Morphine And Related   . Morphine Sulfate Er Beads Other (See Comments)    caused severe cramping   . Omeprazole Other (See Comments)    GI side effects GI side effects  . Spiriva Handihaler [Tiotropium Bromide Monohydrate] Swelling    Tongue  . Statins Other (See Comments)    Other reaction(s): Muscle Pain  . Tetracyclines & Related   . Ace Inhibitors Rash  Current Outpatient Prescriptions  Medication Sig Dispense Refill  . albuterol (ACCUNEB) 1.25 MG/3ML nebulizer solution Take 1 ampule by nebulization every 6 (six) hours as needed for wheezing.    . diclofenac sodium (VOLTAREN) 1 % GEL Apply 2 g topically 4 (four) times daily.    Marland Kitchen FLUoxetine (PROZAC) 20 MG tablet Take 20 mg by mouth daily.    . fluticasone (FLONASE) 50 MCG/ACT nasal spray PLACE 2 SPRAYS INTO BOTH NOSTRILS ONCE DAILY.    . furosemide (LASIX)  20 MG tablet Take 20 mg by mouth 2 (two) times daily.    Marland Kitchen gabapentin (NEURONTIN) 300 MG capsule Take 300 mg by mouth 3 (three) times daily.    Marland Kitchen glyBURIDE micronized (GLYNASE) 6 MG tablet Take 6 mg by mouth 2 (two) times daily before a meal.    . levothyroxine (SYNTHROID, LEVOTHROID) 75 MCG tablet     . pantoprazole (PROTONIX) 40 MG tablet Take 40 mg by mouth 2 (two) times daily.    Marland Kitchen spironolactone (ALDACTONE) 50 MG tablet Take 50 mg by mouth daily.      No current facility-administered medications for this visit.     OBJECTIVE: Vitals:   03/13/17 1343  BP: (!) 137/57  Pulse: 99  Resp: 20  Temp: 97.7 F (36.5 C)     Body mass index is 18.97 kg/m.    ECOG FS:0 - Asymptomatic  General: Well-developed, well-nourished, no acute distress. Eyes: Pink conjunctiva, anicteric sclera. Lungs: Clear to auscultation bilaterally. Heart: Regular rate and rhythm. No rubs, murmurs, or gallops. Abdomen: Soft, nontender, nondistended. No organomegaly noted, normoactive bowel sounds. Musculoskeletal: No edema, cyanosis, or clubbing. Neuro: Alert, answering all questions appropriately. Cranial nerves grossly intact. Skin: No rashes or petechiae noted. Psych: Normal affect.   LAB RESULTS:  Lab Results  Component Value Date   NA 139 07/10/2014   K 3.9 07/10/2014   CL 95 (L) 07/02/2014   CO2 41 (HH) 07/02/2014   GLUCOSE 196 (H) 07/02/2014   BUN 20 07/10/2014   CREATININE 0.7 07/10/2014   CALCIUM 8.7 07/02/2014   PROT 5.1 (L) 07/01/2014   ALBUMIN 2.6 (L) 07/01/2014   AST 24 07/23/2014   ALT 23 07/23/2014   ALKPHOS 58 07/23/2014   BILITOT 0.4 07/01/2014   GFRNONAA >60 07/02/2014   GFRAA >60 07/02/2014    Lab Results  Component Value Date   WBC 3.3 (L) 03/13/2017   NEUTROABS 2.5 03/13/2017   HGB 6.6 (L) 03/13/2017   HCT 21.2 (L) 03/13/2017   MCV 70.2 (L) 03/13/2017   PLT 64 (L) 03/13/2017     STUDIES: No results found.  ASSESSMENT: Thrombocytopenia.  PLAN:    1.  Thrombocytopenia: Patient's platelet count is 64 today which is essentially her baseline. Her platelet count has been as low as 36 and as high as 81 since December 2015. Previously, the remainder of her laboratory work is either negative or within normal limits. The most likely etiology is secondary to cirrhosis and mild splenomegaly as indicated by CT scan. No further intervention is needed at this time.  2. Anemia: Patient's hemoglobin has significantly decreased and is now 6.6. Patient denies any bleeding, hematochezia, or melena. Will proceed with 2 units of packed red blood cells later this week. Patient will then return to clinic in 1 month for repeat laboratory work and further evaluation.   Approximately 30 minutes was spent in discussion of which greater than 50% was consultation.  Patient expressed understanding and was in agreement with this plan.  She also understands that She can call clinic at any time with any questions, concerns, or complaints.    Jeralyn Ruthsimothy J Finnegan, MD   03/13/2017 6:21 PM

## 2017-03-13 ENCOUNTER — Inpatient Hospital Stay (HOSPITAL_BASED_OUTPATIENT_CLINIC_OR_DEPARTMENT_OTHER): Payer: Medicare Other | Admitting: Oncology

## 2017-03-13 ENCOUNTER — Other Ambulatory Visit: Payer: Self-pay

## 2017-03-13 ENCOUNTER — Inpatient Hospital Stay: Payer: Medicare Other | Attending: Hematology and Oncology

## 2017-03-13 VITALS — BP 137/57 | HR 99 | Temp 97.7°F | Resp 20 | Wt 114.0 lb

## 2017-03-13 DIAGNOSIS — E119 Type 2 diabetes mellitus without complications: Secondary | ICD-10-CM | POA: Diagnosis not present

## 2017-03-13 DIAGNOSIS — K219 Gastro-esophageal reflux disease without esophagitis: Secondary | ICD-10-CM | POA: Insufficient documentation

## 2017-03-13 DIAGNOSIS — D696 Thrombocytopenia, unspecified: Secondary | ICD-10-CM | POA: Diagnosis present

## 2017-03-13 DIAGNOSIS — Z79899 Other long term (current) drug therapy: Secondary | ICD-10-CM | POA: Diagnosis not present

## 2017-03-13 DIAGNOSIS — R634 Abnormal weight loss: Secondary | ICD-10-CM

## 2017-03-13 DIAGNOSIS — D649 Anemia, unspecified: Secondary | ICD-10-CM | POA: Diagnosis not present

## 2017-03-13 DIAGNOSIS — Z87891 Personal history of nicotine dependence: Secondary | ICD-10-CM

## 2017-03-13 DIAGNOSIS — Z87442 Personal history of urinary calculi: Secondary | ICD-10-CM | POA: Insufficient documentation

## 2017-03-13 DIAGNOSIS — J449 Chronic obstructive pulmonary disease, unspecified: Secondary | ICD-10-CM | POA: Insufficient documentation

## 2017-03-13 DIAGNOSIS — F329 Major depressive disorder, single episode, unspecified: Secondary | ICD-10-CM

## 2017-03-13 DIAGNOSIS — Z809 Family history of malignant neoplasm, unspecified: Secondary | ICD-10-CM | POA: Insufficient documentation

## 2017-03-13 DIAGNOSIS — D509 Iron deficiency anemia, unspecified: Secondary | ICD-10-CM

## 2017-03-13 DIAGNOSIS — K7460 Unspecified cirrhosis of liver: Secondary | ICD-10-CM | POA: Insufficient documentation

## 2017-03-13 DIAGNOSIS — E039 Hypothyroidism, unspecified: Secondary | ICD-10-CM | POA: Diagnosis not present

## 2017-03-13 LAB — CBC WITH DIFFERENTIAL/PLATELET
Basophils Absolute: 0 10*3/uL (ref 0–0.1)
Basophils Relative: 1 %
Eosinophils Absolute: 0.1 10*3/uL (ref 0–0.7)
Eosinophils Relative: 3 %
HEMATOCRIT: 21.2 % — AB (ref 35.0–47.0)
HEMOGLOBIN: 6.6 g/dL — AB (ref 12.0–16.0)
LYMPHS ABS: 0.3 10*3/uL — AB (ref 1.0–3.6)
LYMPHS PCT: 11 %
MCH: 22 pg — ABNORMAL LOW (ref 26.0–34.0)
MCHC: 31.3 g/dL — ABNORMAL LOW (ref 32.0–36.0)
MCV: 70.2 fL — AB (ref 80.0–100.0)
Monocytes Absolute: 0.3 10*3/uL (ref 0.2–0.9)
Monocytes Relative: 9 %
NEUTROS ABS: 2.5 10*3/uL (ref 1.4–6.5)
NEUTROS PCT: 76 %
Platelets: 64 10*3/uL — ABNORMAL LOW (ref 150–440)
RBC: 3.02 MIL/uL — AB (ref 3.80–5.20)
RDW: 18.4 % — ABNORMAL HIGH (ref 11.5–14.5)
WBC: 3.3 10*3/uL — AB (ref 3.6–11.0)

## 2017-03-13 NOTE — Progress Notes (Signed)
Patient reports she is getting over episode of shingles, denies other concerns today.

## 2017-03-15 LAB — PREPARE RBC (CROSSMATCH)

## 2017-03-16 ENCOUNTER — Inpatient Hospital Stay: Payer: Medicare Other

## 2017-03-16 DIAGNOSIS — D696 Thrombocytopenia, unspecified: Secondary | ICD-10-CM | POA: Diagnosis not present

## 2017-03-16 DIAGNOSIS — D649 Anemia, unspecified: Secondary | ICD-10-CM

## 2017-03-16 LAB — ABO/RH: ABO/RH(D): A POS

## 2017-03-16 MED ORDER — SODIUM CHLORIDE 0.9 % IV SOLN
250.0000 mL | Freq: Once | INTRAVENOUS | Status: AC
  Administered 2017-03-16: 250 mL via INTRAVENOUS
  Filled 2017-03-16: qty 250

## 2017-03-16 MED ORDER — ACETAMINOPHEN 325 MG PO TABS
650.0000 mg | ORAL_TABLET | Freq: Once | ORAL | Status: AC
  Administered 2017-03-16: 650 mg via ORAL

## 2017-03-16 MED ORDER — DIPHENHYDRAMINE HCL 50 MG/ML IJ SOLN
25.0000 mg | Freq: Once | INTRAMUSCULAR | Status: AC
  Administered 2017-03-16: 25 mg via INTRAVENOUS

## 2017-03-17 ENCOUNTER — Telehealth: Payer: Self-pay | Admitting: *Deleted

## 2017-03-17 LAB — BPAM RBC
BLOOD PRODUCT EXPIRATION DATE: 201808212359
BLOOD PRODUCT EXPIRATION DATE: 201808292359
BLOOD PRODUCT EXPIRATION DATE: 201808302359
Blood Product Expiration Date: 201808212359
ISSUE DATE / TIME: 201808160957
ISSUE DATE / TIME: 201808161152
UNIT TYPE AND RH: 600
UNIT TYPE AND RH: 6200
Unit Type and Rh: 600
Unit Type and Rh: 6200

## 2017-03-17 LAB — TYPE AND SCREEN
ABO/RH(D): A POS
Antibody Screen: NEGATIVE
UNIT DIVISION: 0
UNIT DIVISION: 0
Unit division: 0
Unit division: 0

## 2017-03-17 NOTE — Telephone Encounter (Signed)
Agreed.  Thank you

## 2017-03-17 NOTE — Telephone Encounter (Signed)
Patient called to report that she is not feeling well today after her blood transfusion yesterday. She reports that she has no appetite, has been vomiting and has had diarrhea. She states she doe snot have a thermometer but not sure if she has a fever. She does report that she had shaking shills this morning. I advised that she get a thermometer and if she is in fact fevered 100.5 or greater, she needs to go to the ER for evaluation. She agrees to this

## 2017-04-15 NOTE — Progress Notes (Signed)
[facility]  Telephone:(336) [phone] [phone]) [phone]  ID: Angela Austin OB: 1946-05-26  MR#: [id number]  AOZ#:[id number]  Patient Care Team: Kandyce Rud, MD as PCP - General (Family Medicine)  CHIEF COMPLAINT: Thrombocytopenia.  INTERVAL HISTORY: Patient returns to clinic today for repeat laboratory work and further evaluation. She feels mildly improved after receiving 2 units of packed red blood cells approximately one month ago. She currently feels well.  She denies any easy bleeding or bruising. She denies any fevers. She has chronic shortness of breath requiring oxygen, but denies chest pain, cough, or hemoptysis. She has a fair appetite, but denies weight loss. She has no neurologic complaints. She denies any nausea, vomiting, constipation, or diarrhea. She has no melena or hematochezia. She has no urinary complaints. Patient offers no further specific complaints today.  REVIEW OF SYSTEMS:   Review of Systems  Constitutional: Negative.  Negative for fever, malaise/fatigue and weight loss.  Respiratory: Positive for shortness of breath. Negative for cough and hemoptysis.   Cardiovascular: Negative.  Negative for chest pain and leg swelling.  Gastrointestinal: Negative.  Negative for abdominal pain, blood in stool, melena, nausea and vomiting.  Genitourinary: Negative.   Musculoskeletal: Negative.   Neurological: Negative.  Negative for sensory change and weakness.  Endo/Heme/Allergies: Does not bruise/bleed easily.  Psychiatric/Behavioral: Negative.  The patient is not nervous/anxious.     As per HPI. Otherwise, a complete review of systems is negative.  PAST MEDICAL HISTORY: Past Medical History:  Diagnosis Date  . Cirrhosis of liver not due to alcohol (HCC)   . COPD (chronic obstructive pulmonary disease) (HCC)   . Depression   . Diabetes (HCC)   . GERD (gastroesophageal reflux disease)   . Hypothyroidism   . ITP (idiopathic thrombocytopenic  purpura)   . Nephrolithiasis     PAST SURGICAL HISTORY: Past Surgical History:  Procedure Laterality Date  . CATARACT EXTRACTION    . LUMBAR DISC SURGERY    . TONSILLECTOMY AND ADENOIDECTOMY      FAMILY HISTORY Family History  Problem Relation Age of Onset  . Alzheimer's disease Mother   . COPD Mother   . Cancer Mother   . COPD Father   . Diabetes Mellitus II Father   . Heart attack Father        ADVANCED DIRECTIVES:    HEALTH MAINTENANCE: Social History  Substance Use Topics  . Smoking status: Former Smoker    Years: 25.00    Types: Cigarettes  . Smokeless tobacco: Never Used  . Alcohol use No     Colonoscopy:  PAP:  Bone density:  Lipid panel:  Allergies  Allergen Reactions  . Metformin Diarrhea  . Amoxicillin-Pot Clavulanate Other (See Comments)    Other reaction(s): Unknown  . Codeine   . Erythromycin   . Erythromycin Ethylsuccinate     Other reaction(s): Unknown  . Levofloxacin Other (See Comments)    Jittery, nausea  . Metformin And Related     GI bleed  . Morphine Other (See Comments) and Nausea And Vomiting    caused severe cramping   . Morphine And Related   . Morphine Sulfate Er Beads Other (See Comments)    caused severe cramping   . Omeprazole Other (See Comments)    GI side effects GI side effects  . Spiriva Handihaler [Tiotropium Bromide Monohydrate] Swelling    Tongue  . Statins Other (See Comments)    Other reaction(s): Muscle Pain  . Tetracyclines & Related   . Ace  Inhibitors Rash    Current Outpatient Prescriptions  Medication Sig Dispense Refill  . albuterol (ACCUNEB) 1.25 MG/3ML nebulizer solution Take 1 ampule by nebulization every 6 (six) hours as needed for wheezing.    . diclofenac sodium (VOLTAREN) 1 % GEL Apply 2 g topically 4 (four) times daily.    Marland Kitchen FLUoxetine (PROZAC) 20 MG tablet Take 20 mg by mouth daily.    . fluticasone (FLONASE) 50 MCG/ACT nasal spray PLACE 2 SPRAYS INTO BOTH NOSTRILS ONCE DAILY.    .  furosemide (LASIX) 20 MG tablet Take 20 mg by mouth 2 (two) times daily.    Marland Kitchen gabapentin (NEURONTIN) 300 MG capsule Take 300 mg by mouth 3 (three) times daily.    Marland Kitchen glyBURIDE micronized (GLYNASE) 6 MG tablet Take 6 mg by mouth 2 (two) times daily before a meal.    . levothyroxine (SYNTHROID, LEVOTHROID) 75 MCG tablet     . loratadine (CLARITIN) 10 MG tablet Take 10 mg by mouth daily as needed for allergies.    . pantoprazole (PROTONIX) 40 MG tablet Take 40 mg by mouth 2 (two) times daily.    Marland Kitchen spironolactone (ALDACTONE) 50 MG tablet Take 50 mg by mouth daily.      No current facility-administered medications for this visit.     OBJECTIVE: Vitals:   04/17/17 1445  BP: (!) 148/74  Pulse: (!) 102  Resp: 20  Temp: (!) 97.1 F (36.2 C)     Body mass index is 18.06 kg/m.    ECOG FS:0 - Asymptomatic  General: Well-developed, well-nourished, no acute distress. Eyes: Pink conjunctiva, anicteric sclera. Lungs: Clear to auscultation bilaterally. Heart: Regular rate and rhythm. No rubs, murmurs, or gallops. Abdomen: Soft, nontender, nondistended. No organomegaly noted, normoactive bowel sounds. Musculoskeletal: No edema, cyanosis, or clubbing. Neuro: Alert, answering all questions appropriately. Cranial nerves grossly intact. Skin: No rashes or petechiae noted. Psych: Normal affect.   LAB RESULTS:  Lab Results  Component Value Date   NA 139 07/10/2014   K 3.9 07/10/2014   CL 95 (L) 07/02/2014   CO2 41 (HH) 07/02/2014   GLUCOSE 196 (H) 07/02/2014   BUN 20 07/10/2014   CREATININE 0.7 07/10/2014   CALCIUM 8.7 07/02/2014   PROT 5.1 (L) 07/01/2014   ALBUMIN 2.6 (L) 07/01/2014   AST 24 07/23/2014   ALT 23 07/23/2014   ALKPHOS 58 07/23/2014   BILITOT 0.4 07/01/2014   GFRNONAA >60 07/02/2014   GFRAA >60 07/02/2014    Lab Results  Component Value Date   WBC 2.8 (L) 04/17/2017   NEUTROABS 2.1 04/17/2017   HGB 9.3 (L) 04/17/2017   HCT 28.4 (L) 04/17/2017   MCV 75.2 (L)  04/17/2017   PLT 59 (L) 04/17/2017     STUDIES: No results found.  ASSESSMENT: Thrombocytopenia, Iron deficiency anemia.Marland Kitchen  PLAN:    1. Thrombocytopenia: Patient's platelet count is 61 today which is essentially her baseline. Her platelet count has been as low as 36 and as high as 81 since December 2015. Previously, the remainder of her laboratory work is either negative or within normal limits. The most likely etiology is secondary to cirrhosis and mild splenomegaly as indicated by CT scan. No further intervention is needed at this time.  2. Anemia: Patient's hemoglobin has improved back to near her baseline of 9.3. She received 2 units of packed red blood cells one month ago after being found to have a hemoglobin of 6.6. Patient had no obvious source of blood loss at that time.  She had no evidence of hemolysis. No intervention is needed at this time. Will draw iron stores with next blood draw. Return to clinic in 1 month for further evaluation.  Approximately 20 minutes was spent in discussion of which greater than 50% was consultation.  Patient expressed understanding and was in agreement with this plan. She also understands that She can call clinic at any time with any questions, concerns, or complaints.    [name], MD   04/17/2017 4:00 PM

## 2017-04-17 ENCOUNTER — Other Ambulatory Visit: Payer: Self-pay

## 2017-04-17 ENCOUNTER — Inpatient Hospital Stay: Payer: Medicare Other | Attending: Oncology

## 2017-04-17 ENCOUNTER — Inpatient Hospital Stay (HOSPITAL_BASED_OUTPATIENT_CLINIC_OR_DEPARTMENT_OTHER): Payer: Medicare Other | Admitting: Oncology

## 2017-04-17 VITALS — BP 148/74 | HR 102 | Temp 97.1°F | Resp 20 | Wt 108.5 lb

## 2017-04-17 DIAGNOSIS — K219 Gastro-esophageal reflux disease without esophagitis: Secondary | ICD-10-CM | POA: Diagnosis not present

## 2017-04-17 DIAGNOSIS — R0602 Shortness of breath: Secondary | ICD-10-CM | POA: Insufficient documentation

## 2017-04-17 DIAGNOSIS — D649 Anemia, unspecified: Secondary | ICD-10-CM | POA: Insufficient documentation

## 2017-04-17 DIAGNOSIS — F329 Major depressive disorder, single episode, unspecified: Secondary | ICD-10-CM

## 2017-04-17 DIAGNOSIS — Z87891 Personal history of nicotine dependence: Secondary | ICD-10-CM | POA: Insufficient documentation

## 2017-04-17 DIAGNOSIS — E039 Hypothyroidism, unspecified: Secondary | ICD-10-CM

## 2017-04-17 DIAGNOSIS — J449 Chronic obstructive pulmonary disease, unspecified: Secondary | ICD-10-CM

## 2017-04-17 DIAGNOSIS — E119 Type 2 diabetes mellitus without complications: Secondary | ICD-10-CM | POA: Insufficient documentation

## 2017-04-17 DIAGNOSIS — K7460 Unspecified cirrhosis of liver: Secondary | ICD-10-CM | POA: Diagnosis not present

## 2017-04-17 DIAGNOSIS — Z87442 Personal history of urinary calculi: Secondary | ICD-10-CM

## 2017-04-17 DIAGNOSIS — D509 Iron deficiency anemia, unspecified: Secondary | ICD-10-CM

## 2017-04-17 DIAGNOSIS — D696 Thrombocytopenia, unspecified: Secondary | ICD-10-CM | POA: Diagnosis not present

## 2017-04-17 LAB — SAMPLE TO BLOOD BANK

## 2017-04-17 LAB — CBC WITH DIFFERENTIAL/PLATELET
Basophils Absolute: 0 10*3/uL (ref 0–0.1)
Basophils Relative: 1 %
Eosinophils Absolute: 0.1 10*3/uL (ref 0–0.7)
Eosinophils Relative: 4 %
HCT: 28.4 % — ABNORMAL LOW (ref 35.0–47.0)
Hemoglobin: 9.3 g/dL — ABNORMAL LOW (ref 12.0–16.0)
Lymphocytes Relative: 13 %
Lymphs Abs: 0.4 10*3/uL — ABNORMAL LOW (ref 1.0–3.6)
MCH: 24.7 pg — ABNORMAL LOW (ref 26.0–34.0)
MCHC: 32.8 g/dL (ref 32.0–36.0)
MCV: 75.2 fL — ABNORMAL LOW (ref 80.0–100.0)
Monocytes Absolute: 0.2 10*3/uL (ref 0.2–0.9)
Monocytes Relative: 9 %
Neutro Abs: 2.1 10*3/uL (ref 1.4–6.5)
Neutrophils Relative %: 73 %
Platelets: 59 10*3/uL — ABNORMAL LOW (ref 150–440)
RBC: 3.78 MIL/uL — ABNORMAL LOW (ref 3.80–5.20)
RDW: 22 % — ABNORMAL HIGH (ref 11.5–14.5)
WBC: 2.8 10*3/uL — ABNORMAL LOW (ref 3.6–11.0)

## 2017-04-17 NOTE — Progress Notes (Signed)
Patient denies any concerns today.  

## 2017-05-15 NOTE — Progress Notes (Signed)
[facility]  Telephone:(336) [phone] [phone]) [phone]  ID: Angela Austin OB: [birth year]  MR#: [id number]  AOZ#:[id number]  Patient Care Team: Kandyce Rud, MD as PCP - General (Family Medicine)  CHIEF COMPLAINT: Thrombocytopenia.  INTERVAL HISTORY: Patient returns to clinic today for repeat laboratory work and further evaluation. She continues to have chronic weakness and fatigue, but otherwise feels well. She denies any easy bleeding or bruising. She denies any fevers. She has chronic shortness of breath requiring oxygen, but denies chest pain, cough, or hemoptysis. She has a fair appetite, but denies weight loss. She has no neurologic complaints. She denies any nausea, vomiting, constipation, or diarrhea. She has no melena or hematochezia. She has no urinary complaints. Patient offers no further specific complaints today.  REVIEW OF SYSTEMS:   Review of Systems  Constitutional: Positive for malaise/fatigue. Negative for fever and weight loss.  Respiratory: Positive for shortness of breath. Negative for cough and hemoptysis.   Cardiovascular: Negative.  Negative for chest pain and leg swelling.  Gastrointestinal: Negative.  Negative for abdominal pain, blood in stool, melena, nausea and vomiting.  Genitourinary: Negative.  Negative for hematuria.  Musculoskeletal: Negative.   Neurological: Positive for weakness. Negative for sensory change.  Endo/Heme/Allergies: Does not bruise/bleed easily.  Psychiatric/Behavioral: Negative.  The patient is not nervous/anxious.     As per HPI. Otherwise, a complete review of systems is negative.  PAST MEDICAL HISTORY: Past Medical History:  Diagnosis Date  . Cirrhosis of liver not due to alcohol (HCC)   . COPD (chronic obstructive pulmonary disease) (HCC)   . Depression   . Diabetes (HCC)   . GERD (gastroesophageal reflux disease)   . Hypothyroidism   . ITP (idiopathic thrombocytopenic purpura)   .  Nephrolithiasis     PAST SURGICAL HISTORY: Past Surgical History:  Procedure Laterality Date  . CATARACT EXTRACTION    . LUMBAR DISC SURGERY    . TONSILLECTOMY AND ADENOIDECTOMY      FAMILY HISTORY Family History  Problem Relation Age of Onset  . Alzheimer's disease Mother   . COPD Mother   . Cancer Mother   . COPD Father   . Diabetes Mellitus II Father   . Heart attack Father        ADVANCED DIRECTIVES:    HEALTH MAINTENANCE: Social History  Substance Use Topics  . Smoking status: Former Smoker    Years: 25.00    Types: Cigarettes  . Smokeless tobacco: Never Used  . Alcohol use No     Colonoscopy:  PAP:  Bone density:  Lipid panel:  Allergies  Allergen Reactions  . Metformin Diarrhea  . Amoxicillin-Pot Clavulanate Other (See Comments)    Other reaction(s): Unknown  . Codeine   . Erythromycin   . Erythromycin Ethylsuccinate     Other reaction(s): Unknown  . Levofloxacin Other (See Comments)    Jittery, nausea  . Metformin And Related     GI bleed  . Morphine Other (See Comments) and Nausea And Vomiting    caused severe cramping   . Morphine And Related   . Morphine Sulfate Er Beads Other (See Comments)    caused severe cramping   . Omeprazole Other (See Comments)    GI side effects GI side effects  . Spiriva Handihaler [Tiotropium Bromide Monohydrate] Swelling    Tongue  . Statins Other (See Comments)    Other reaction(s): Muscle Pain  . Tetracyclines & Related   . Ace Inhibitors Rash    Current Outpatient Prescriptions  Medication Sig Dispense Refill  . albuterol (ACCUNEB) 1.25 MG/3ML nebulizer solution Take 1 ampule by nebulization every 6 (six) hours as needed for wheezing.    . diclofenac sodium (VOLTAREN) 1 % GEL Apply 2 g topically 4 (four) times daily.    Marland Kitchen FLUoxetine (PROZAC) 20 MG tablet Take 20 mg by mouth daily.    . fluticasone (FLONASE) 50 MCG/ACT nasal spray PLACE 2 SPRAYS INTO BOTH NOSTRILS ONCE DAILY.    . furosemide (LASIX)  20 MG tablet Take 20 mg by mouth 2 (two) times daily.    Marland Kitchen gabapentin (NEURONTIN) 300 MG capsule Take 300 mg by mouth 3 (three) times daily.    Marland Kitchen glyBURIDE micronized (GLYNASE) 6 MG tablet Take 6 mg by mouth 2 (two) times daily before a meal.    . levothyroxine (SYNTHROID, LEVOTHROID) 75 MCG tablet     . loratadine (CLARITIN) 10 MG tablet Take 10 mg by mouth daily as needed for allergies.    . pantoprazole (PROTONIX) 40 MG tablet Take 40 mg by mouth 2 (two) times daily.    Marland Kitchen spironolactone (ALDACTONE) 50 MG tablet Take 50 mg by mouth daily.      No current facility-administered medications for this visit.     OBJECTIVE: Vitals:   05/17/17 1429  BP: (!) 146/57  Pulse: 98  Resp: 18  Temp: 98 F (36.7 C)     Body mass index is 18.8 kg/m.    ECOG FS:0 - Asymptomatic  General: Well-developed, well-nourished, no acute distress. Eyes: Pink conjunctiva, anicteric sclera. Lungs: Clear to auscultation bilaterally. Heart: Regular rate and rhythm. No rubs, murmurs, or gallops. Abdomen: Soft, nontender, nondistended. No organomegaly noted, normoactive bowel sounds. Musculoskeletal: No edema, cyanosis, or clubbing. Neuro: Alert, answering all questions appropriately. Cranial nerves grossly intact. Skin: No rashes or petechiae noted. Psych: Normal affect.   LAB RESULTS:  Lab Results  Component Value Date   NA 139 07/10/2014   K 3.9 07/10/2014   CL 95 (L) 07/02/2014   CO2 41 (HH) 07/02/2014   GLUCOSE 196 (H) 07/02/2014   BUN 20 07/10/2014   CREATININE 0.7 07/10/2014   CALCIUM 8.7 07/02/2014   PROT 5.1 (L) 07/01/2014   ALBUMIN 2.6 (L) 07/01/2014   AST 24 07/23/2014   ALT 23 07/23/2014   ALKPHOS 58 07/23/2014   BILITOT 0.4 07/01/2014   GFRNONAA >60 07/02/2014   GFRAA >60 07/02/2014    Lab Results  Component Value Date   WBC 3.2 (L) 05/17/2017   NEUTROABS 2.4 05/17/2017   HGB 9.3 (L) 05/17/2017   HCT 29.3 (L) 05/17/2017   MCV 77.6 (L) 05/17/2017   PLT 73 (L) 05/17/2017      STUDIES: No results found.  ASSESSMENT: Thrombocytopenia, Iron deficiency anemia.Marland Kitchen  PLAN:    1. Thrombocytopenia: Patient's platelet count is 73 today which is essentially her baseline. Her platelet count has been as low as 36 and as high as 81 since December 2015. Previously, the remainder of her laboratory work is either negative or within normal limits. The most likely etiology is secondary to cirrhosis and mild splenomegaly as indicated by CT scan. No further intervention is needed at this time.  2. Anemia: Patient's hemoglobin has improved back to near her baseline of 9.3. He has a mild iron deficiency with saturation of 10%, but the remainder of her laboratory work including hemolysis labs are negative or within normal limits. She does not require a blood transfusion today. No intervention is needed at this time. Return to  clinic in 6 weeks with laboratory work and then in 3 months with laboratory work and further evaluation.  Approximately 20 minutes was spent in discussion of which greater than 50% was consultation.  Patient expressed understanding and was in agreement with this plan. She also understands that She can call clinic at any time with any questions, concerns, or complaints.    [name], MD   05/17/2017 4:00 PM

## 2017-05-16 ENCOUNTER — Other Ambulatory Visit: Payer: Self-pay | Admitting: Oncology

## 2017-05-17 ENCOUNTER — Inpatient Hospital Stay (HOSPITAL_BASED_OUTPATIENT_CLINIC_OR_DEPARTMENT_OTHER): Payer: Medicare Other | Admitting: Oncology

## 2017-05-17 ENCOUNTER — Inpatient Hospital Stay: Payer: Medicare Other | Attending: Oncology

## 2017-05-17 ENCOUNTER — Other Ambulatory Visit: Payer: Self-pay

## 2017-05-17 ENCOUNTER — Inpatient Hospital Stay: Payer: Medicare Other

## 2017-05-17 VITALS — BP 146/57 | HR 98 | Temp 98.0°F | Resp 18 | Wt 113.0 lb

## 2017-05-17 DIAGNOSIS — D696 Thrombocytopenia, unspecified: Secondary | ICD-10-CM | POA: Insufficient documentation

## 2017-05-17 DIAGNOSIS — Z87891 Personal history of nicotine dependence: Secondary | ICD-10-CM

## 2017-05-17 DIAGNOSIS — J449 Chronic obstructive pulmonary disease, unspecified: Secondary | ICD-10-CM | POA: Insufficient documentation

## 2017-05-17 DIAGNOSIS — Z79899 Other long term (current) drug therapy: Secondary | ICD-10-CM | POA: Diagnosis not present

## 2017-05-17 DIAGNOSIS — D509 Iron deficiency anemia, unspecified: Secondary | ICD-10-CM | POA: Diagnosis not present

## 2017-05-17 DIAGNOSIS — K7460 Unspecified cirrhosis of liver: Secondary | ICD-10-CM

## 2017-05-17 DIAGNOSIS — F329 Major depressive disorder, single episode, unspecified: Secondary | ICD-10-CM | POA: Diagnosis not present

## 2017-05-17 DIAGNOSIS — E119 Type 2 diabetes mellitus without complications: Secondary | ICD-10-CM | POA: Diagnosis not present

## 2017-05-17 DIAGNOSIS — R531 Weakness: Secondary | ICD-10-CM | POA: Insufficient documentation

## 2017-05-17 DIAGNOSIS — Z809 Family history of malignant neoplasm, unspecified: Secondary | ICD-10-CM | POA: Diagnosis not present

## 2017-05-17 DIAGNOSIS — R0602 Shortness of breath: Secondary | ICD-10-CM

## 2017-05-17 DIAGNOSIS — K219 Gastro-esophageal reflux disease without esophagitis: Secondary | ICD-10-CM | POA: Diagnosis not present

## 2017-05-17 DIAGNOSIS — R5383 Other fatigue: Secondary | ICD-10-CM | POA: Diagnosis not present

## 2017-05-17 DIAGNOSIS — Z87442 Personal history of urinary calculi: Secondary | ICD-10-CM | POA: Diagnosis not present

## 2017-05-17 DIAGNOSIS — E039 Hypothyroidism, unspecified: Secondary | ICD-10-CM | POA: Diagnosis not present

## 2017-05-17 LAB — CBC WITH DIFFERENTIAL/PLATELET
BASOS ABS: 0 10*3/uL (ref 0–0.1)
BASOS PCT: 1 %
EOS PCT: 4 %
Eosinophils Absolute: 0.1 10*3/uL (ref 0–0.7)
HCT: 29.3 % — ABNORMAL LOW (ref 35.0–47.0)
Hemoglobin: 9.3 g/dL — ABNORMAL LOW (ref 12.0–16.0)
Lymphocytes Relative: 11 %
Lymphs Abs: 0.4 10*3/uL — ABNORMAL LOW (ref 1.0–3.6)
MCH: 24.6 pg — ABNORMAL LOW (ref 26.0–34.0)
MCHC: 31.8 g/dL — ABNORMAL LOW (ref 32.0–36.0)
MCV: 77.6 fL — ABNORMAL LOW (ref 80.0–100.0)
MONO ABS: 0.3 10*3/uL (ref 0.2–0.9)
Monocytes Relative: 8 %
Neutro Abs: 2.4 10*3/uL (ref 1.4–6.5)
Neutrophils Relative %: 76 %
PLATELETS: 73 10*3/uL — AB (ref 150–440)
RBC: 3.78 MIL/uL — ABNORMAL LOW (ref 3.80–5.20)
RDW: 18.4 % — AB (ref 11.5–14.5)
WBC: 3.2 10*3/uL — ABNORMAL LOW (ref 3.6–11.0)

## 2017-05-17 LAB — SAMPLE TO BLOOD BANK

## 2017-05-17 LAB — IRON AND TIBC
IRON: 30 ug/dL (ref 28–170)
SATURATION RATIOS: 10 % — AB (ref 10.4–31.8)
TIBC: 298 ug/dL (ref 250–450)
UIBC: 268 ug/dL

## 2017-05-17 LAB — FERRITIN: Ferritin: 60 ng/mL (ref 11–307)

## 2017-05-17 NOTE — Progress Notes (Signed)
Patient denies any concerns today.  

## 2017-06-28 ENCOUNTER — Inpatient Hospital Stay: Payer: Medicare Other | Attending: Oncology

## 2017-06-28 DIAGNOSIS — D509 Iron deficiency anemia, unspecified: Secondary | ICD-10-CM | POA: Diagnosis not present

## 2017-06-28 LAB — CBC WITH DIFFERENTIAL/PLATELET
BASOS ABS: 0 10*3/uL (ref 0–0.1)
Basophils Relative: 1 %
Eosinophils Absolute: 0.1 10*3/uL (ref 0–0.7)
Eosinophils Relative: 3 %
HEMATOCRIT: 27.8 % — AB (ref 35.0–47.0)
HEMOGLOBIN: 8.9 g/dL — AB (ref 12.0–16.0)
LYMPHS ABS: 0.3 10*3/uL — AB (ref 1.0–3.6)
LYMPHS PCT: 13 %
MCH: 24.8 pg — AB (ref 26.0–34.0)
MCHC: 31.8 g/dL — ABNORMAL LOW (ref 32.0–36.0)
MCV: 77.8 fL — AB (ref 80.0–100.0)
Monocytes Absolute: 0.3 10*3/uL (ref 0.2–0.9)
Monocytes Relative: 9 %
NEUTROS ABS: 2 10*3/uL (ref 1.4–6.5)
Neutrophils Relative %: 74 %
Platelets: 54 10*3/uL — ABNORMAL LOW (ref 150–440)
RBC: 3.58 MIL/uL — AB (ref 3.80–5.20)
RDW: 15.5 % — ABNORMAL HIGH (ref 11.5–14.5)
WBC: 2.7 10*3/uL — AB (ref 3.6–11.0)

## 2017-06-28 LAB — IRON AND TIBC
IRON: 31 ug/dL (ref 28–170)
Saturation Ratios: 10 % — ABNORMAL LOW (ref 10.4–31.8)
TIBC: 328 ug/dL (ref 250–450)
UIBC: 297 ug/dL

## 2017-06-28 LAB — FERRITIN: FERRITIN: 35 ng/mL (ref 11–307)

## 2017-08-21 NOTE — Progress Notes (Signed)
[facility]  Telephone:(336) [phone] Fax:(336) [phone]  ID: Angela Austin OB: 1945/09/25  MR#: [id number]  AOZ#:[id number]  Patient Care Team: Kandyce Rud, MD as PCP - General (Family Medicine)  CHIEF COMPLAINT: Thrombocytopenia.  INTERVAL HISTORY: Patient returns to clinic today for repeat laboratory work and further evaluation. She continues to have chronic weakness and fatigue, which is slightly worse over the past several weeks.  She otherwise feels well. She denies any easy bleeding or bruising. She denies any fevers. She has chronic shortness of breath and requires oxygen 24 hours a day.  She denies any chest pain, cough, or hemoptysis. She has a fair appetite, but denies weight loss. She has no neurologic complaints. She denies any nausea, vomiting, constipation, or diarrhea. She has no melena or hematochezia. She has no urinary complaints. Patient offers no further specific complaints today.  REVIEW OF SYSTEMS:   Review of Systems  Constitutional: Positive for malaise/fatigue. Negative for fever and weight loss.  Respiratory: Positive for shortness of breath. Negative for cough and hemoptysis.   Cardiovascular: Negative.  Negative for chest pain and leg swelling.  Gastrointestinal: Negative.  Negative for abdominal pain, blood in stool, melena, nausea and vomiting.  Genitourinary: Negative.  Negative for hematuria.  Musculoskeletal: Negative.   Neurological: Positive for weakness. Negative for sensory change.  Endo/Heme/Allergies: Does not bruise/bleed easily.  Psychiatric/Behavioral: Negative.  The patient is not nervous/anxious.     As per HPI. Otherwise, a complete review of systems is negative.  PAST MEDICAL HISTORY: Past Medical History:  Diagnosis Date  . Cirrhosis of liver not due to alcohol (HCC)   . COPD (chronic obstructive pulmonary disease) (HCC)   . Depression   . Diabetes (HCC)   . GERD (gastroesophageal reflux disease)   .  Hypothyroidism   . ITP (idiopathic thrombocytopenic purpura)   . Nephrolithiasis     PAST SURGICAL HISTORY: Past Surgical History:  Procedure Laterality Date  . CATARACT EXTRACTION    . LUMBAR DISC SURGERY    . TONSILLECTOMY AND ADENOIDECTOMY      FAMILY HISTORY Family History  Problem Relation Age of Onset  . Alzheimer's disease Mother   . COPD Mother   . Cancer Mother   . COPD Father   . Diabetes Mellitus II Father   . Heart attack Father        ADVANCED DIRECTIVES:    HEALTH MAINTENANCE: Social History   Tobacco Use  . Smoking status: Former Smoker    Years: 25.00    Types: Cigarettes  . Smokeless tobacco: Never Used  Substance Use Topics  . Alcohol use: No    Alcohol/week: 0.0 oz  . Drug use: No     Colonoscopy:  PAP:  Bone density:  Lipid panel:  Allergies  Allergen Reactions  . Metformin Diarrhea  . Amoxicillin-Pot Clavulanate Other (See Comments)    Other reaction(s): Unknown  . Codeine   . Erythromycin   . Erythromycin Ethylsuccinate     Other reaction(s): Unknown  . Levofloxacin Other (See Comments)    Jittery, nausea  . Metformin And Related     GI bleed  . Morphine Other (See Comments) and Nausea And Vomiting    caused severe cramping   . Morphine And Related   . Morphine Sulfate Er Beads Other (See Comments)    caused severe cramping   . Omeprazole Other (See Comments)    GI side effects GI side effects  . Spiriva Handihaler [Tiotropium Bromide Monohydrate] Swelling  Tongue  . Statins Other (See Comments)    Other reaction(s): Muscle Pain  . Tetracyclines & Related   . Ace Inhibitors Rash    Current Outpatient Medications  Medication Sig Dispense Refill  . albuterol (ACCUNEB) 1.25 MG/3ML nebulizer solution Take 1 ampule by nebulization every 6 (six) hours as needed for wheezing.    . diclofenac sodium (VOLTAREN) 1 % GEL Apply 2 g topically 4 (four) times daily.    Marland Kitchen. FLUoxetine (PROZAC) 20 MG tablet Take 20 mg by mouth  daily.    . furosemide (LASIX) 20 MG tablet Take 20 mg by mouth 2 (two) times daily.    Marland Kitchen. gabapentin (NEURONTIN) 300 MG capsule Take 300 mg by mouth 3 (three) times daily.    Marland Kitchen. glyBURIDE micronized (GLYNASE) 6 MG tablet Take 6 mg by mouth 2 (two) times daily before a meal.    . levothyroxine (SYNTHROID, LEVOTHROID) 75 MCG tablet     . loratadine (CLARITIN) 10 MG tablet Take 10 mg by mouth daily as needed for allergies.    . pantoprazole (PROTONIX) 40 MG tablet Take 40 mg by mouth 2 (two) times daily.    Marland Kitchen. spironolactone (ALDACTONE) 50 MG tablet Take 50 mg by mouth daily.     . fluticasone (FLONASE) 50 MCG/ACT nasal spray PLACE 2 SPRAYS INTO BOTH NOSTRILS ONCE DAILY.     No current facility-administered medications for this visit.     OBJECTIVE: Vitals:   08/22/17 1424  BP: (!) 147/72  Pulse: 99  Resp: 20  Temp: 98.1 F (36.7 C)  SpO2: 97%     Body mass index is 19.62 kg/m.    ECOG FS:0 - Asymptomatic  General: Well-developed, well-nourished, no acute distress. Eyes: Pink conjunctiva, anicteric sclera. Lungs: Clear to auscultation bilaterally. Heart: Regular rate and rhythm. No rubs, murmurs, or gallops. Abdomen: Soft, nontender, nondistended. No organomegaly noted, normoactive bowel sounds. Musculoskeletal: No edema, cyanosis, or clubbing. Neuro: Alert, answering all questions appropriately. Cranial nerves grossly intact. Skin: No rashes or petechiae noted. Psych: Normal affect.   LAB RESULTS:  Lab Results  Component Value Date   NA 139 07/10/2014   K 3.9 07/10/2014   CL 95 (L) 07/02/2014   CO2 41 (HH) 07/02/2014   GLUCOSE 196 (H) 07/02/2014   BUN 20 07/10/2014   CREATININE 0.7 07/10/2014   CALCIUM 8.7 07/02/2014   PROT 5.1 (L) 07/01/2014   ALBUMIN 2.6 (L) 07/01/2014   AST 24 07/23/2014   ALT 23 07/23/2014   ALKPHOS 58 07/23/2014   BILITOT 0.4 07/01/2014   GFRNONAA >60 07/02/2014   GFRAA >60 07/02/2014    Lab Results  Component Value Date   WBC 2.8 (L)  08/22/2017   NEUTROABS 2.1 08/22/2017   HGB 7.7 (L) 08/22/2017   HCT 24.3 (L) 08/22/2017   MCV 75.4 (L) 08/22/2017   PLT 58 (L) 08/22/2017     STUDIES: No results found.  ASSESSMENT: Thrombocytopenia, Iron deficiency anemia.Marland Kitchen.  PLAN:    1. Thrombocytopenia: Patient's platelet count is 58 today which is essentially her baseline. Her platelet count has been as low as 36 and as high as 81 since December 2015. Previously, the remainder of her laboratory work was either negative or within normal limits. The most likely etiology is secondary to cirrhosis and splenomegaly as indicated by CT scan. No further intervention is needed at this time.  2.  Iron deficiency anemia: Patient's baseline hemoglobin is approximately 9-10.  Previously, her iron stores were decreased, today's result is pending.  Patient is also more symptomatic, therefore will proceed with 510 mg IV Feraheme today.  Patient will return to clinic in 1 week for a second infusion. She will then return to clinic in 3 months with laboratory work and further evaluation. 3.  Leukopenia: Decreased, but unchanged.  Monitor.  Approximately 30 minutes was spent in discussion of which greater than 50% was consultation.  Patient expressed understanding and was in agreement with this plan. She also understands that She can call clinic at any time with any questions, concerns, or complaints.    [name], MD   08/22/2017 2:40 PM

## 2017-08-22 ENCOUNTER — Inpatient Hospital Stay (HOSPITAL_BASED_OUTPATIENT_CLINIC_OR_DEPARTMENT_OTHER): Payer: Medicare Other | Admitting: Oncology

## 2017-08-22 ENCOUNTER — Inpatient Hospital Stay: Payer: Medicare Other

## 2017-08-22 ENCOUNTER — Inpatient Hospital Stay: Payer: Medicare Other | Attending: Oncology

## 2017-08-22 ENCOUNTER — Other Ambulatory Visit: Payer: Self-pay

## 2017-08-22 ENCOUNTER — Encounter: Payer: Self-pay | Admitting: Oncology

## 2017-08-22 VITALS — BP 147/72 | HR 99 | Temp 98.1°F | Resp 20 | Wt 117.9 lb

## 2017-08-22 VITALS — BP 147/66 | HR 87 | Resp 20

## 2017-08-22 DIAGNOSIS — D509 Iron deficiency anemia, unspecified: Secondary | ICD-10-CM

## 2017-08-22 DIAGNOSIS — Z87891 Personal history of nicotine dependence: Secondary | ICD-10-CM

## 2017-08-22 DIAGNOSIS — D696 Thrombocytopenia, unspecified: Secondary | ICD-10-CM | POA: Insufficient documentation

## 2017-08-22 DIAGNOSIS — R531 Weakness: Secondary | ICD-10-CM | POA: Diagnosis not present

## 2017-08-22 DIAGNOSIS — Z9981 Dependence on supplemental oxygen: Secondary | ICD-10-CM | POA: Diagnosis not present

## 2017-08-22 DIAGNOSIS — R0602 Shortness of breath: Secondary | ICD-10-CM | POA: Insufficient documentation

## 2017-08-22 DIAGNOSIS — R5382 Chronic fatigue, unspecified: Secondary | ICD-10-CM

## 2017-08-22 DIAGNOSIS — D72819 Decreased white blood cell count, unspecified: Secondary | ICD-10-CM

## 2017-08-22 LAB — IRON AND TIBC
IRON: 29 ug/dL (ref 28–170)
Saturation Ratios: 9 % — ABNORMAL LOW (ref 10.4–31.8)
TIBC: 339 ug/dL (ref 250–450)
UIBC: 310 ug/dL

## 2017-08-22 LAB — CBC WITH DIFFERENTIAL/PLATELET
BASOS ABS: 0 10*3/uL (ref 0–0.1)
BASOS PCT: 1 %
EOS PCT: 3 %
Eosinophils Absolute: 0.1 10*3/uL (ref 0–0.7)
HCT: 24.3 % — ABNORMAL LOW (ref 35.0–47.0)
HEMOGLOBIN: 7.7 g/dL — AB (ref 12.0–16.0)
LYMPHS PCT: 11 %
Lymphs Abs: 0.3 10*3/uL — ABNORMAL LOW (ref 1.0–3.6)
MCH: 23.9 pg — ABNORMAL LOW (ref 26.0–34.0)
MCHC: 31.8 g/dL — ABNORMAL LOW (ref 32.0–36.0)
MCV: 75.4 fL — ABNORMAL LOW (ref 80.0–100.0)
MONO ABS: 0.3 10*3/uL (ref 0.2–0.9)
Monocytes Relative: 9 %
NEUTROS ABS: 2.1 10*3/uL (ref 1.4–6.5)
Neutrophils Relative %: 76 %
Platelets: 58 10*3/uL — ABNORMAL LOW (ref 150–440)
RBC: 3.23 MIL/uL — ABNORMAL LOW (ref 3.80–5.20)
RDW: 16 % — ABNORMAL HIGH (ref 11.5–14.5)
WBC: 2.8 10*3/uL — ABNORMAL LOW (ref 3.6–11.0)

## 2017-08-22 LAB — FERRITIN: Ferritin: 30 ng/mL (ref 11–307)

## 2017-08-22 MED ORDER — SODIUM CHLORIDE 0.9 % IV SOLN
Freq: Once | INTRAVENOUS | Status: AC
  Administered 2017-08-22: 15:00:00 via INTRAVENOUS
  Filled 2017-08-22: qty 1000

## 2017-08-22 MED ORDER — SODIUM CHLORIDE 0.9 % IV SOLN
510.0000 mg | Freq: Once | INTRAVENOUS | Status: AC
  Administered 2017-08-22: 510 mg via INTRAVENOUS
  Filled 2017-08-22: qty 17

## 2017-08-22 NOTE — Progress Notes (Signed)
Patient denies any concerns today.  

## 2017-08-30 ENCOUNTER — Inpatient Hospital Stay: Payer: Medicare Other

## 2017-08-30 VITALS — BP 157/69 | HR 89 | Temp 97.0°F | Resp 20

## 2017-08-30 DIAGNOSIS — D509 Iron deficiency anemia, unspecified: Secondary | ICD-10-CM

## 2017-08-30 MED ORDER — SODIUM CHLORIDE 0.9 % IV SOLN
510.0000 mg | Freq: Once | INTRAVENOUS | Status: AC
  Administered 2017-08-30: 510 mg via INTRAVENOUS
  Filled 2017-08-30: qty 17

## 2017-08-30 MED ORDER — SODIUM CHLORIDE 0.9 % IV SOLN
Freq: Once | INTRAVENOUS | Status: AC
  Administered 2017-08-30: 14:00:00 via INTRAVENOUS
  Filled 2017-08-30: qty 1000

## 2017-10-28 ENCOUNTER — Emergency Department: Payer: Medicare Other

## 2017-10-28 ENCOUNTER — Other Ambulatory Visit: Payer: Self-pay

## 2017-10-28 ENCOUNTER — Encounter: Payer: Self-pay | Admitting: Emergency Medicine

## 2017-10-28 ENCOUNTER — Emergency Department
Admission: EM | Admit: 2017-10-28 | Discharge: 2017-10-28 | Disposition: A | Discharge status: Home / Self Care | Payer: Medicare Other | Attending: Emergency Medicine | Admitting: Emergency Medicine

## 2017-10-28 DIAGNOSIS — E119 Type 2 diabetes mellitus without complications: Secondary | ICD-10-CM | POA: Insufficient documentation

## 2017-10-28 DIAGNOSIS — Z79899 Other long term (current) drug therapy: Secondary | ICD-10-CM | POA: Diagnosis not present

## 2017-10-28 DIAGNOSIS — Z87891 Personal history of nicotine dependence: Secondary | ICD-10-CM | POA: Insufficient documentation

## 2017-10-28 DIAGNOSIS — R0600 Dyspnea, unspecified: Secondary | ICD-10-CM

## 2017-10-28 DIAGNOSIS — J449 Chronic obstructive pulmonary disease, unspecified: Secondary | ICD-10-CM | POA: Diagnosis not present

## 2017-10-28 DIAGNOSIS — Z7984 Long term (current) use of oral hypoglycemic drugs: Secondary | ICD-10-CM | POA: Insufficient documentation

## 2017-10-28 DIAGNOSIS — J40 Bronchitis, not specified as acute or chronic: Secondary | ICD-10-CM | POA: Insufficient documentation

## 2017-10-28 DIAGNOSIS — E039 Hypothyroidism, unspecified: Secondary | ICD-10-CM | POA: Insufficient documentation

## 2017-10-28 DIAGNOSIS — I10 Essential (primary) hypertension: Secondary | ICD-10-CM | POA: Diagnosis not present

## 2017-10-28 DIAGNOSIS — F329 Major depressive disorder, single episode, unspecified: Secondary | ICD-10-CM | POA: Diagnosis not present

## 2017-10-28 DIAGNOSIS — R05 Cough: Secondary | ICD-10-CM | POA: Diagnosis present

## 2017-10-28 LAB — BASIC METABOLIC PANEL
ANION GAP: 15 (ref 5–15)
BUN: 31 mg/dL — ABNORMAL HIGH (ref 6–20)
CALCIUM: 8.8 mg/dL — AB (ref 8.9–10.3)
CO2: 28 mmol/L (ref 22–32)
CREATININE: 1.78 mg/dL — AB (ref 0.44–1.00)
Chloride: 88 mmol/L — ABNORMAL LOW (ref 101–111)
GFR calc non Af Amer: 28 mL/min — ABNORMAL LOW (ref >60)
GFR, EST AFRICAN AMERICAN: 32 mL/min — AB (ref >60)
Glucose, Bld: 480 mg/dL — ABNORMAL HIGH (ref 65–99)
Potassium: 4 mmol/L (ref 3.5–5.1)
SODIUM: 131 mmol/L — AB (ref 135–145)

## 2017-10-28 LAB — CBC
HCT: 34.6 % — ABNORMAL LOW (ref 35.0–47.0)
HEMOGLOBIN: 10.9 g/dL — AB (ref 12.0–16.0)
MCH: 26.3 pg (ref 26.0–34.0)
MCHC: 31.6 g/dL — ABNORMAL LOW (ref 32.0–36.0)
MCV: 83.1 fL (ref 80.0–100.0)
PLATELETS: 65 10*3/uL — AB (ref 150–440)
RBC: 4.16 MIL/uL (ref 3.80–5.20)
RDW: 17.1 % — ABNORMAL HIGH (ref 11.5–14.5)
WBC: 5.2 10*3/uL (ref 3.6–11.0)

## 2017-10-28 LAB — GLUCOSE, CAPILLARY: Glucose-Capillary: 223 mg/dL — ABNORMAL HIGH (ref 65–99)

## 2017-10-28 LAB — TROPONIN I

## 2017-10-28 MED ORDER — INSULIN ASPART 100 UNIT/ML ~~LOC~~ SOLN
4.0000 [IU] | Freq: Once | SUBCUTANEOUS | Status: AC
  Administered 2017-10-28: 4 [IU] via INTRAVENOUS
  Filled 2017-10-28: qty 1

## 2017-10-28 MED ORDER — AZITHROMYCIN 250 MG PO TABS: 250.0000 mg | ORAL_TABLET | Freq: Every day | ORAL | 0 refills | Status: DC

## 2017-10-28 MED ORDER — AZITHROMYCIN 500 MG PO TABS
500.0000 mg | ORAL_TABLET | Freq: Once | ORAL | Status: AC
  Administered 2017-10-28: 500 mg via ORAL
  Filled 2017-10-28: qty 1

## 2017-10-28 MED ORDER — SODIUM CHLORIDE 0.9 % IV BOLUS
1000.0000 mL | Freq: Once | INTRAVENOUS | Status: AC
  Administered 2017-10-28: 1000 mL via INTRAVENOUS

## 2017-10-28 NOTE — ED Provider Notes (Signed)
[facility] Emergency Department Provider Note  Time seen: 6:25 PM  I have reviewed the triage vital signs and the nursing notes.   HISTORY  Chief Complaint Shortness of Breath and Sore Throat    HPI Angela Austin is a 72 y.o. female with a past medical history of medication-induced cirrhosis, COPD on 2 L of oxygen 24/7, diabetes, depression, gastric reflux, ITP, thrombus cytopenia, presents to the emergency department for cough, sore throat and shortness of breath.  According to the patient for the past 2 weeks she has been coughing, saw her doctor 10 days ago was diagnosed with laryngitis and started on a prednisone taper.  Patient states the cough has continued, states she is fine as long she sits still but whenever she gets up and starts moving around this provokes a cough, states she is tired of dealing with it which is why she came to the emergency department today.  Denies any chest pain.  Denies abdominal pain, nausea vomiting diarrhea dysuria, largely negative review of systems otherwise.   Past Medical History:  Diagnosis Date  . Cirrhosis of liver not due to alcohol (HCC)   . COPD (chronic obstructive pulmonary disease) (HCC)   . Depression   . Diabetes (HCC)   . GERD (gastroesophageal reflux disease)   . Hypothyroidism   . ITP (idiopathic thrombocytopenic purpura)   . Nephrolithiasis     Patient Active Problem List   Diagnosis Date Noted  . Thrombocytopenia (HCC) 02/10/2015  . Physical deconditioning 07/03/2014  . COPD exacerbation (HCC) 07/03/2014  . Cirrhosis of liver not due to alcohol (HCC) 07/03/2014  . Essential hypertension, benign 07/03/2014  . Anemia, iron deficiency 07/03/2014  . Major depression 07/03/2014  . Esophageal varices in cirrhosis (HCC) 07/03/2014  . Secondary esophageal varices with bleeding (HCC) 06/06/2014  . Centrilobular emphysema (HCC) 12/24/2013  . Other and unspecified hyperlipidemia 12/24/2013  . Type II  or unspecified type diabetes mellitus without mention of complication, uncontrolled 12/24/2013  . Hypothyroidism 12/24/2013    Past Surgical History:  Procedure Laterality Date  . CATARACT EXTRACTION    . LUMBAR DISC SURGERY    . TONSILLECTOMY AND ADENOIDECTOMY      Prior to Admission medications   Medication Sig Start Date End Date Taking? Authorizing Provider  albuterol (ACCUNEB) 1.25 MG/3ML nebulizer solution Take 1 ampule by nebulization every 6 (six) hours as needed for wheezing.    [provider]  diclofenac sodium (VOLTAREN) 1 % GEL Apply 2 g topically 4 (four) times daily.    [provider]  FLUoxetine (PROZAC) 20 MG tablet Take 20 mg by mouth daily.    [provider]  fluticasone (FLONASE) 50 MCG/ACT nasal spray PLACE 2 SPRAYS INTO BOTH NOSTRILS ONCE DAILY. 11/27/15   [provider]  furosemide (LASIX) 20 MG tablet Take 20 mg by mouth 2 (two) times daily.    [provider]  gabapentin (NEURONTIN) 300 MG capsule Take 300 mg by mouth 3 (three) times daily.    [provider]  glyBURIDE micronized (GLYNASE) 6 MG tablet Take 6 mg by mouth 2 (two) times daily before a meal.    [provider]  levothyroxine (SYNTHROID, LEVOTHROID) 75 MCG tablet  01/07/16   [provider]  loratadine (CLARITIN) 10 MG tablet Take 10 mg by mouth daily as needed for allergies.    [provider]  pantoprazole (PROTONIX) 40 MG tablet Take 40 mg by mouth 2 (two) times daily.  [provider]  spironolactone (ALDACTONE) 50 MG tablet Take 50 mg by mouth daily.     [provider]    Allergies  Allergen Reactions  . Metformin Diarrhea  . Amoxicillin-Pot Clavulanate Other (See Comments)    Other reaction(s): Unknown  . Codeine   . Erythromycin   . Erythromycin Ethylsuccinate     Other reaction(s): Unknown  . Levofloxacin Other (See Comments)    Jittery, nausea  . Metformin And Related     GI bleed   . Morphine Other (See Comments) and Nausea And Vomiting    caused severe cramping   . Morphine And Related   . Morphine Sulfate Er Beads Other (See Comments)    caused severe cramping   . Omeprazole Other (See Comments)    GI side effects GI side effects  . Spiriva Handihaler [Tiotropium Bromide Monohydrate] Swelling    Tongue  . Statins Other (See Comments)    Other reaction(s): Muscle Pain  . Tetracyclines & Related   . Ace Inhibitors Rash    Family History  Problem Relation Age of Onset  . Alzheimer's disease Mother   . COPD Mother   . Cancer Mother   . COPD Father   . Diabetes Mellitus II Father   . Heart attack Father     Social History Social History   Tobacco Use  . Smoking status: Former Smoker    Years: 25.00    Types: Cigarettes  . Smokeless tobacco: Never Used  Substance Use Topics  . Alcohol use: No    Alcohol/week: 0.0 oz  . Drug use: No    Review of Systems Constitutional: Negative for fever. Eyes: Negative for visual complaints ENT: Positive for nasal congestion, chronic per patient.  Positive for sore throat. Cardiovascular: Negative for chest pain. Respiratory: Mild shortness of breath.  Positive for frequent cough.  States sputum production. Gastrointestinal: Negative for abdominal pain, vomiting and diarrhea. Genitourinary: Negative for urinary compaints Musculoskeletal: Negative for leg pain or swelling. Skin: Negative for skin complaints  Neurological: Negative for headache All other ROS negative  ____________________________________________   PHYSICAL EXAM:  VITAL SIGNS: ED Triage Vitals  Enc Vitals Group     BP 10/28/17 1710 (!) 126/43     Pulse Rate 10/28/17 1710 (!) 102     Resp 10/28/17 1710 18     Temp 10/28/17 1710 98.1 F (36.7 C)     Temp Source 10/28/17 1710 Oral     SpO2 10/28/17 1710 (!) 87 %     Weight 10/28/17 1712 105 lb (47.6 kg)     Height 10/28/17 1712 5\' 5"  (1.651 m)     Head Circumference --      Peak  Flow --      Pain Score 10/28/17 1720 10     Pain Loc --      Pain Edu? --      Excl. in GC? --     Constitutional: Alert and oriented. Well appearing and in no distress. Eyes: Normal exam ENT   Head: Normocephalic and atraumatic.   Nose: No congestion/rhinnorhea.   Mouth/Throat: Mucous membranes are moist.  Normal-appearing oropharynx without erythema exudate or edema last hypertrophy. Cardiovascular: Normal rate, regular rhythm. No murmur Respiratory: Normal respiratory effort without tachypnea nor retractions. Breath sounds are clear Gastrointestinal: Soft and nontender. No distention.   Musculoskeletal: Nontender with normal range of motion in all extremities. No lower extremity tenderness or edema. Neurologic:  Normal speech and language. No gross focal  neurologic deficits  Skin:  Skin is warm, dry and intact.  Psychiatric: Mood and affect are normal.   ____________________________________________    EKG  EKG reviewed and interpreted by myself shows normal sinus rhythm at 99 bpm with a narrow QRS, normal axis, normal intervals, nonspecific no concerning ST changes.  ____________________________________________    RADIOLOGY  X-ray shows COPD but no focal consolidation or acute abnormality  ____________________________________________   INITIAL IMPRESSION / ASSESSMENT AND PLAN / ED COURSE  Pertinent labs & imaging results that were available during my care of the patient were reviewed by me and considered in my medical decision making (see chart for details).  Patient presents to the emergency department for increased cough and shortness of breath over the past 2 weeks.  Patient is finishing a prednisone taper tomorrow.  States she continues to have frequent cough which is what prompted today's ER visit.  Differential would include COPD exacerbation, bronchitis, upper respiratory infection, pneumonia.  We will check labs including troponin, chest x-ray and  closely monitor.  Patient's labs show acute renal insufficiency, we will IV hydrate.  Normal white blood cell count.  Troponin pending.  Chest x-ray pending.  Per care everywhere note reviewed patient's recent creatinine has been around 1.5, today's creatinine is slightly increased over that but not dramatically.  Overall patient appears well, states she is feeling much better after fluids.  Labs including troponin are normal.  X-ray consistent with COPD, currently satting 99% on 3 L, was satting well on 2 L which she wears 24/7.  We will place the patient on antibiotics, Zithromax.  Patient states she has taken Zithromax previously without allergy.  She will follow-up with her primary care doctor this week.  Discussed return precautions for any chest pain or worsening trouble breathing.  ____________________________________________   FINAL CLINICAL IMPRESSION(S) / ED DIAGNOSES  Dyspnea Cough Bronchitis   Minna Antis, MD 10/28/17 2114

## 2017-10-28 NOTE — ED Triage Notes (Signed)
Pt arrived via POV from home with reports of increasing sore throat and increased shortness of breath.  Pt states about 10 days ago she was dx with laryngitis and sinus infection, states she is currently on a prednisone x 10 days and takes last dose tomorrow.  Pt states she has noticed increased SOB and has had to start using Nebs and increased oxygen from 2L to 3L.  Pt uses oxygen continuously for COPD.

## 2017-10-28 NOTE — Discharge Instructions (Addendum)
As we discussed please take antibiotic as prescribed, please call your doctor on Monday for a follow-up appointment.  Return to the emergency department for any development of chest pain, worsening trouble breathing, or any other symptom personally concerning to yourself.

## 2017-10-28 NOTE — ED Notes (Signed)
ED Provider at bedside. 

## 2017-10-28 NOTE — ED Notes (Signed)
Pt reports being on prednisone x 9 days after this RN explained blood glucose is high, fluids started and insulin administered.

## 2017-11-07 ENCOUNTER — Emergency Department
Admission: EM | Admit: 2017-11-07 | Discharge: 2017-11-07 | Disposition: A | Discharge status: Home / Self Care | Payer: Medicare Other | Attending: Emergency Medicine | Admitting: Emergency Medicine

## 2017-11-07 ENCOUNTER — Emergency Department: Payer: Medicare Other

## 2017-11-07 ENCOUNTER — Other Ambulatory Visit: Payer: Self-pay

## 2017-11-07 DIAGNOSIS — E119 Type 2 diabetes mellitus without complications: Secondary | ICD-10-CM | POA: Insufficient documentation

## 2017-11-07 DIAGNOSIS — Z79899 Other long term (current) drug therapy: Secondary | ICD-10-CM | POA: Insufficient documentation

## 2017-11-07 DIAGNOSIS — J449 Chronic obstructive pulmonary disease, unspecified: Secondary | ICD-10-CM | POA: Diagnosis not present

## 2017-11-07 DIAGNOSIS — E039 Hypothyroidism, unspecified: Secondary | ICD-10-CM | POA: Insufficient documentation

## 2017-11-07 DIAGNOSIS — Z87891 Personal history of nicotine dependence: Secondary | ICD-10-CM | POA: Insufficient documentation

## 2017-11-07 DIAGNOSIS — I10 Essential (primary) hypertension: Secondary | ICD-10-CM | POA: Insufficient documentation

## 2017-11-07 DIAGNOSIS — R0600 Dyspnea, unspecified: Secondary | ICD-10-CM | POA: Insufficient documentation

## 2017-11-07 LAB — COMPREHENSIVE METABOLIC PANEL
ALBUMIN: 3.2 g/dL — AB (ref 3.5–5.0)
ALT: 28 U/L (ref 14–54)
AST: 43 U/L — ABNORMAL HIGH (ref 15–41)
Alkaline Phosphatase: 77 U/L (ref 38–126)
Anion gap: 7 (ref 5–15)
BUN: 25 mg/dL — ABNORMAL HIGH (ref 6–20)
CO2: 34 mmol/L — AB (ref 22–32)
CREATININE: 1.5 mg/dL — AB (ref 0.44–1.00)
Calcium: 8.7 mg/dL — ABNORMAL LOW (ref 8.9–10.3)
Chloride: 91 mmol/L — ABNORMAL LOW (ref 101–111)
GFR calc non Af Amer: 34 mL/min — ABNORMAL LOW (ref >60)
GFR, EST AFRICAN AMERICAN: 39 mL/min — AB (ref >60)
Glucose, Bld: 217 mg/dL — ABNORMAL HIGH (ref 65–99)
Potassium: 4.4 mmol/L (ref 3.5–5.1)
SODIUM: 132 mmol/L — AB (ref 135–145)
Total Bilirubin: 1.4 mg/dL — ABNORMAL HIGH (ref 0.3–1.2)
Total Protein: 5.9 g/dL — ABNORMAL LOW (ref 6.5–8.1)

## 2017-11-07 LAB — CBC WITH DIFFERENTIAL/PLATELET
BASOS ABS: 0 10*3/uL (ref 0–0.1)
Basophils Relative: 0 %
Eosinophils Absolute: 0.1 10*3/uL (ref 0–0.7)
Eosinophils Relative: 3 %
HEMATOCRIT: 33.2 % — AB (ref 35.0–47.0)
Hemoglobin: 10.8 g/dL — ABNORMAL LOW (ref 12.0–16.0)
LYMPHS PCT: 8 %
Lymphs Abs: 0.4 10*3/uL — ABNORMAL LOW (ref 1.0–3.6)
MCH: 26.4 pg (ref 26.0–34.0)
MCHC: 32.4 g/dL (ref 32.0–36.0)
MCV: 81.4 fL (ref 80.0–100.0)
MONO ABS: 0.4 10*3/uL (ref 0.2–0.9)
Monocytes Relative: 8 %
NEUTROS ABS: 3.8 10*3/uL (ref 1.4–6.5)
Neutrophils Relative %: 81 %
Platelets: 69 10*3/uL — ABNORMAL LOW (ref 150–440)
RBC: 4.08 MIL/uL (ref 3.80–5.20)
RDW: 16.5 % — ABNORMAL HIGH (ref 11.5–14.5)
WBC: 4.7 10*3/uL (ref 3.6–11.0)

## 2017-11-07 LAB — BRAIN NATRIURETIC PEPTIDE: B NATRIURETIC PEPTIDE 5: 105 pg/mL — AB (ref 0.0–100.0)

## 2017-11-07 LAB — TROPONIN I: Troponin I: <0.03 ng/mL (ref <0.03)

## 2017-11-07 MED ORDER — ALBUTEROL SULFATE (2.5 MG/3ML) 0.083% IN NEBU
2.5000 mg | INHALATION_SOLUTION | Freq: Once | RESPIRATORY_TRACT | Status: AC
Start: 2017-11-07 — End: 2017-11-07
  Administered 2017-11-07: 2.5 mg via RESPIRATORY_TRACT
  Filled 2017-11-07: qty 3

## 2017-11-07 MED ORDER — ALBUTEROL SULFATE (2.5 MG/3ML) 0.083% IN NEBU
2.5000 mg | INHALATION_SOLUTION | Freq: Once | RESPIRATORY_TRACT | Status: AC
  Administered 2017-11-07: 2.5 mg via RESPIRATORY_TRACT
  Filled 2017-11-07: qty 3

## 2017-11-07 MED ORDER — SODIUM CHLORIDE 0.9 % IV BOLUS
1000.0000 mL | Freq: Once | INTRAVENOUS | Status: AC
  Administered 2017-11-07: 1000 mL via INTRAVENOUS

## 2017-11-07 NOTE — ED Triage Notes (Signed)
Pt arrives via ems from home with complaints of cough, trouble breathing, and generalized weakness x4 weeks. EMS report cap refill >3 sec. Pt uses o2 2L at home. Pt states she recently finished z-pack and prednisone for sinus infection.

## 2017-11-07 NOTE — Discharge Instructions (Signed)
please follow-up with your regular doctor. I would call them in the morning and see if they can see her tomorrow. Continue the albuterol inhalers up to every 4 hours as needed.

## 2017-11-07 NOTE — ED Provider Notes (Addendum)
[facility] Emergency Department Provider Note   ____________________________________________   First MD Initiated Contact with Patient 11/07/17 1708     (approximate)  I have reviewed the triage vital signs and the nursing notes.   HISTORY  Chief Complaint Fatigue and Shortness of Breath    HPI Angela Austin is a 72 y.o. female She has a history of cirrhosis says she's been coughing up thick but clear phlegm. This is been going on for about 4 weeks and was colored apparently but now is clear. Patient says she is very tired and have any appetite. She says she is very worried. She is usually on 2 L of oxygen. Here on 2 L her O2 sats are 99 200.   Past Medical History:  Diagnosis Date  . Cirrhosis of liver not due to alcohol (HCC)   . COPD (chronic obstructive pulmonary disease) (HCC)   . Depression   . Diabetes (HCC)   . GERD (gastroesophageal reflux disease)   . Hypothyroidism   . ITP (idiopathic thrombocytopenic purpura)   . Nephrolithiasis     Patient Active Problem List   Diagnosis Date Noted  . Thrombocytopenia (HCC) 02/10/2015  . Physical deconditioning 07/03/2014  . COPD exacerbation (HCC) 07/03/2014  . Cirrhosis of liver not due to alcohol (HCC) 07/03/2014  . Essential hypertension, benign 07/03/2014  . Anemia, iron deficiency 07/03/2014  . Major depression 07/03/2014  . Esophageal varices in cirrhosis (HCC) 07/03/2014  . Secondary esophageal varices with bleeding (HCC) 06/06/2014  . Centrilobular emphysema (HCC) 12/24/2013  . Other and unspecified hyperlipidemia 12/24/2013  . Type II or unspecified type diabetes mellitus without mention of complication, uncontrolled 12/24/2013  . Hypothyroidism 12/24/2013    Past Surgical History:  Procedure Laterality Date  . CATARACT EXTRACTION    . LUMBAR DISC SURGERY    . TONSILLECTOMY AND ADENOIDECTOMY      Prior to Admission medications   Medication Sig Start Date End Date  Taking? Authorizing Provider  albuterol (ACCUNEB) 1.25 MG/3ML nebulizer solution Take 1 ampule by nebulization every 6 (six) hours as needed for wheezing.    [provider]  azithromycin (ZITHROMAX) 250 MG tablet Take 1 tablet (250 mg total) by mouth daily. 10/28/17   Minna Antis, MD  diclofenac sodium (VOLTAREN) 1 % GEL Apply 2 g topically 4 (four) times daily.    [provider]  FLUoxetine (PROZAC) 20 MG tablet Take 20 mg by mouth daily.    [provider]  fluticasone (FLONASE) 50 MCG/ACT nasal spray PLACE 2 SPRAYS INTO BOTH NOSTRILS ONCE DAILY. 11/27/15   [provider]  furosemide (LASIX) 20 MG tablet Take 20 mg by mouth 2 (two) times daily.    [provider]  gabapentin (NEURONTIN) 300 MG capsule Take 300 mg by mouth 3 (three) times daily.    [provider]  glyBURIDE micronized (GLYNASE) 6 MG tablet Take 6 mg by mouth 2 (two) times daily before a meal.    [provider]  levothyroxine (SYNTHROID, LEVOTHROID) 75 MCG tablet  01/07/16   [provider]  loratadine (CLARITIN) 10 MG tablet Take 10 mg by mouth daily as needed for allergies.    [provider]  pantoprazole (PROTONIX) 40 MG tablet Take 40 mg by mouth 2 (two) times daily.    [provider]  spironolactone (ALDACTONE) 50 MG tablet Take 50 mg by mouth daily.     [provider]    Allergies Metformin; Amoxicillin-pot clavulanate; Codeine; Erythromycin;  Erythromycin ethylsuccinate; Levofloxacin; Metformin and related; Morphine; Morphine and related; Morphine sulfate er beads; Omeprazole; Spiriva handihaler [tiotropium bromide monohydrate]; Statins; Tetracyclines & related; and Ace inhibitors  Family History  Problem Relation Age of Onset  . Alzheimer's disease Mother   . COPD Mother   . Cancer Mother   . COPD Father   . Diabetes Mellitus II Father   . Heart attack Father     Social History Social History   Tobacco  Use  . Smoking status: Former Smoker    Years: 25.00    Types: Cigarettes  . Smokeless tobacco: Never Used  Substance Use Topics  . Alcohol use: No    Alcohol/week: 0.0 oz  . Drug use: No    Review of Systems  Constitutional: No fever/chills Eyes: No visual changes. ENT: No sore throat. Cardiovascular: Denies chest pain. Respiratory: some shortness of breath but mostly cough. Gastrointestinal: No abdominal pain.  No nausea, no vomiting.  No diarrhea.  No constipation. Genitourinary: Negative for dysuria. Musculoskeletal: Negative for back pain. Skin: Negative for rash. Neurological: Negative for headaches, focal weakness  ____________________________________________   PHYSICAL EXAM:  VITAL SIGNS: ED Triage Vitals [11/07/17 1704]  Enc Vitals Group     BP      Pulse      Resp      Temp      Temp src      SpO2 (S) 95 %     Weight      Height      Head Circumference      Peak Flow      Pain Score      Pain Loc      Pain Edu?      Excl. in GC?    Constitutional: Alert and oriented. Well appearing and in no acute distress. Eyes: Conjunctivae are normal.  Head: Atraumatic. Nose: No congestion/rhinnorhea. Mouth/Throat: Mucous membranes are moist.  Oropharynx non-erythematous. Neck: No stridor.  Cardiovascular: Normal rate, regular rhythm. Grossly normal heart sounds.  Good peripheral circulation. Respiratory: Normal respiratory effort.  No retractions. Lungs seems tight with some wheezes Gastrointestinal: Soft and nontender. and some distention. No abdominal bruits. No CVA tenderness. Musculoskeletal: No lower extremity tenderness nor edema.   Neurologic:  Normal speech and language. No gross focal neurologic deficits are appreciated.  Skin:  Skin is warm, dry and intact. No rash noted. Psychiatric: Mood and affect are normal. Speech and behavior are normal.  ____________________________________________   LABS (all labs ordered are listed, but only abnormal  results are displayed)  Labs Reviewed  COMPREHENSIVE METABOLIC PANEL - Abnormal; Notable for the following components:      Result Value   Sodium 132 (*)    Chloride 91 (*)    CO2 34 (*)    Glucose, Bld 217 (*)    BUN 25 (*)    Creatinine, Ser 1.50 (*)    Calcium 8.7 (*)    Total Protein 5.9 (*)    Albumin 3.2 (*)    AST 43 (*)    Total Bilirubin 1.4 (*)    GFR calc non Af Amer 34 (*)    GFR calc Af Amer 39 (*)    All other components within normal limits  BRAIN NATRIURETIC PEPTIDE - Abnormal; Notable for the following components:   B Natriuretic Peptide 105.0 (*)    All other components within normal limits  CBC WITH DIFFERENTIAL/PLATELET - Abnormal; Notable for the following components:   Hemoglobin 10.8 (*)  HCT 33.2 (*)    RDW 16.5 (*)    Platelets 69 (*)    Lymphs Abs 0.4 (*)    All other components within normal limits  TROPONIN I   ____________________________________________  EKG  EKG read and interpreted by me shows normal sinus rhythm rate of 98 normal axis nonspecific ST-T wave changes ____________________________________________  RADIOLOGY  ED MD interpretation:   Official radiology report(s): Dg Chest Portable 1 View  Result Date: 11/07/2017 CLINICAL DATA:  Cough and shortness of breath EXAM: PORTABLE CHEST 1 VIEW COMPARISON:  10/28/2017 FINDINGS: Lungs are hyperinflated. No pleural effusion or pneumothorax. Faint opacities at the left lung base, likely atelectasis. No other consolidation. No pulmonary edema. Normal cardiomediastinal contours. IMPRESSION: COPD and suspected left basilar atelectasis. Electronically Signed   By: Deatra RobinsonKevin  Herman M.D.   On: 11/07/2017 17:22    ____________________________________________   PROCEDURES  Procedure(s) performed:   Procedures  Critical Care performed:   ____________________________________________   INITIAL IMPRESSION / ASSESSMENT AND PLAN / ED COURSE  patient ambulates without great difficulty and  is able to maintain her oxygen saturations of 97% I will discharge her have her follow-up with her doctor tomorrow and continue using the albuterol.   I need to add the patient is very reluctant to go home however I have looked at her and her lab results etc. in detail can find  no reason to admit herI have also gone over her case with the hospital doctor who then reviewed her labs and x-rays etc. and he agrees.      ____________________________________________   FINAL CLINICAL IMPRESSION(S) / ED DIAGNOSES  Final diagnoses:  Dyspnea, unspecified type     ED Discharge Orders    None       Note:  This document was prepared using Dragon voice recognition software and may include unintentional dictation errors.    Arnaldo NatalMalinda, Neven Fina F, MD 11/07/17 2017    Arnaldo NatalMalinda, Vaden Becherer F, MD 11/07/17 [phone]

## 2017-11-20 NOTE — Progress Notes (Signed)
[facility]  [phone] Fax:(336) [phone]  ID: Angela DowJackie L Zara OB: 04-27-1946  MR#: [id number]  QIO#:[id number]#:[id number]  Patient Care Team: Kandyce RudBabaoff, Marcus, MD as PCP - General (Family Medicine)  CHIEF COMPLAINT: Thrombocytopenia.  INTERVAL HISTORY: Patient returns to clinic today for repeat laboratory work and further evaluation.  She continues to have chronic weakness and fatigue, but states this is improved.  She recently finished antibiotics for "bronchitis".  She has some mild diarrhea, but otherwise feels well. She denies any easy bleeding or bruising. She denies any fevers. She has chronic shortness of breath and requires oxygen 24 hours a day.  She denies any chest pain, cough, or hemoptysis. She has a fair appetite, but denies weight loss. She has no neurologic complaints. She denies any nausea, vomiting, or constipation. She has no melena or hematochezia. She has no urinary complaints.  Patient offers no further specific complaints today.  REVIEW OF SYSTEMS:   Review of Systems  Constitutional: Positive for malaise/fatigue. Negative for fever and weight loss.  Respiratory: Positive for shortness of breath. Negative for cough and hemoptysis.   Cardiovascular: Negative.  Negative for chest pain and leg swelling.  Gastrointestinal: Positive for diarrhea. Negative for abdominal pain, blood in stool, melena, nausea and vomiting.  Genitourinary: Negative.  Negative for hematuria.  Musculoskeletal: Negative.   Neurological: Positive for weakness. Negative for sensory change.  Endo/Heme/Allergies: Does not bruise/bleed easily.  Psychiatric/Behavioral: Negative.  The patient is not nervous/anxious.     As per HPI. Otherwise, a complete review of systems is negative.  PAST MEDICAL HISTORY: Past Medical History:  Diagnosis Date  . Cirrhosis of liver not due to alcohol (HCC)   . COPD (chronic obstructive pulmonary disease) (HCC)   . Depression   .  Diabetes (HCC)   . GERD (gastroesophageal reflux disease)   . Hypothyroidism   . ITP (idiopathic thrombocytopenic purpura)   . Nephrolithiasis     PAST SURGICAL HISTORY: Past Surgical History:  Procedure Laterality Date  . CATARACT EXTRACTION    . LUMBAR DISC SURGERY    . TONSILLECTOMY AND ADENOIDECTOMY      FAMILY HISTORY Family History  Problem Relation Age of Onset  . Alzheimer's disease Mother   . COPD Mother   . Cancer Mother   . COPD Father   . Diabetes Mellitus II Father   . Heart attack Father        ADVANCED DIRECTIVES:    HEALTH MAINTENANCE: Social History   Tobacco Use  . Smoking status: Former Smoker    Years: 25.00    Types: Cigarettes  . Smokeless tobacco: Never Used  Substance Use Topics  . Alcohol use: No    Alcohol/week: 0.0 oz  . Drug use: No     Colonoscopy:  PAP:  Bone density:  Lipid panel:  Allergies  Allergen Reactions  . Metformin Diarrhea  . Amoxicillin-Pot Clavulanate Other (See Comments)    Other reaction(s): Unknown  . Codeine   . Erythromycin   . Erythromycin Ethylsuccinate     Other reaction(s): Unknown  . Levofloxacin Other (See Comments)    Jittery, nausea  . Metformin And Related     GI bleed  . Morphine Other (See Comments) and Nausea And Vomiting    caused severe cramping   . Morphine And Related   . Morphine Sulfate Er Beads Other (See Comments)    caused severe cramping   . Omeprazole Other (See Comments)    GI side effects GI side effects  .  Spiriva Handihaler [Tiotropium Bromide Monohydrate] Swelling    Tongue  . Statins Other (See Comments)    Other reaction(s): Muscle Pain  . Tetracyclines & Related   . Ace Inhibitors Rash    Current Outpatient Medications  Medication Sig Dispense Refill  . FLUoxetine (PROZAC) 20 MG tablet Take 20 mg by mouth daily.    . fluticasone (FLONASE) 50 MCG/ACT nasal spray PLACE 2 SPRAYS INTO BOTH NOSTRILS ONCE DAILY.    . furosemide (LASIX) 20 MG tablet Take 20 mg by  mouth 2 (two) times daily.    Marland Kitchen levothyroxine (SYNTHROID, LEVOTHROID) 75 MCG tablet     . pantoprazole (PROTONIX) 40 MG tablet Take 40 mg by mouth 2 (two) times daily.    Marland Kitchen spironolactone (ALDACTONE) 50 MG tablet Take 50 mg by mouth daily.     Marland Kitchen triamcinolone ointment (KENALOG) 0.5 % Apply topically.    Marland Kitchen albuterol (ACCUNEB) 1.25 MG/3ML nebulizer solution Take 1 ampule by nebulization every 6 (six) hours as needed for wheezing.    Marland Kitchen albuterol (PROVENTIL HFA) 108 (90 Base) MCG/ACT inhaler Inhale 1 puff into the lungs every 4 (four) hours as needed.     . diclofenac sodium (VOLTAREN) 1 % GEL Apply 2 g topically 4 (four) times daily.    Marland Kitchen gabapentin (NEURONTIN) 300 MG capsule Take 300 mg by mouth 3 (three) times daily.    Marland Kitchen loratadine (CLARITIN) 10 MG tablet Take 10 mg by mouth daily as needed for allergies.     No current facility-administered medications for this visit.     OBJECTIVE: Vitals:   11/22/17 1428  BP: (!) 147/70  Pulse: 100  Resp: 20  Temp: (!) 96.2 F (35.7 C)     Body mass index is 19.57 kg/m.    ECOG FS:1 - Symptomatic but completely ambulatory  General: Thin, no acute distress. Eyes: Pink conjunctiva, anicteric sclera. Lungs: Clear to auscultation bilaterally. Heart: Regular rate and rhythm. No rubs, murmurs, or gallops. Abdomen: Mildly distended, nontender. Musculoskeletal: No edema, cyanosis, or clubbing. Neuro: Alert, answering all questions appropriately. Cranial nerves grossly intact. Skin: No rashes or petechiae noted. Psych: Normal affect.   LAB RESULTS:  Lab Results  Component Value Date   NA 132 (L) 11/07/2017   K 4.4 11/07/2017   CL 91 (L) 11/07/2017   CO2 34 (H) 11/07/2017   GLUCOSE 217 (H) 11/07/2017   BUN 25 (H) 11/07/2017   CREATININE 1.50 (H) 11/07/2017   CALCIUM 8.7 (L) 11/07/2017   PROT 5.9 (L) 11/07/2017   ALBUMIN 3.2 (L) 11/07/2017   AST 43 (H) 11/07/2017   ALT 28 11/07/2017   ALKPHOS 77 11/07/2017   BILITOT 1.4 (H) 11/07/2017    GFRNONAA 34 (L) 11/07/2017   GFRAA 39 (L) 11/07/2017    Lab Results  Component Value Date   WBC 3.9 11/22/2017   NEUTROABS 3.0 11/22/2017   HGB 10.3 (L) 11/22/2017   HCT 31.4 (L) 11/22/2017   MCV 82.7 11/22/2017   PLT 57 (L) 11/22/2017     STUDIES: Dg Chest 2 View  Result Date: 10/28/2017 CLINICAL DATA:  Shortness of breath EXAM: CHEST - 2 VIEW COMPARISON:  Chest CT 06/30/2014 FINDINGS: Apical predominant bullous emphysema. Diffuse interstitial prominence. No focal airspace consolidation. No pulmonary edema. No pneumothorax or pleural effusion. Cardiomediastinal silhouette is normal. IMPRESSION: COPD.  No focal consolidation. Electronically Signed   By: Deatra Robinson M.D.   On: 10/28/2017 18:56   Dg Chest Portable 1 View  Result Date: 11/07/2017 CLINICAL DATA:  Cough and shortness of breath EXAM: PORTABLE CHEST 1 VIEW COMPARISON:  10/28/2017 FINDINGS: Lungs are hyperinflated. No pleural effusion or pneumothorax. Faint opacities at the left lung base, likely atelectasis. No other consolidation. No pulmonary edema. Normal cardiomediastinal contours. IMPRESSION: COPD and suspected left basilar atelectasis. Electronically Signed   By: Deatra Robinson M.D.   On: 11/07/2017 17:22    ASSESSMENT: Thrombocytopenia, Iron deficiency anemia.Marland Kitchen  PLAN:    1. Thrombocytopenia: Patient's platelet count today is 57 which is approximately her baseline.  Her platelet count has ranged as low as 36 and as high as 81 since December 2015. Previously, the remainder of her laboratory work was either negative or within normal limits. The most likely etiology is secondary to cirrhosis and splenomegaly as indicated by CT scan. No further intervention is needed at this time.  Return to clinic in 3 months for further evaluation. 2.  Iron deficiency anemia: Patient's baseline hemoglobin is approximately 9-10.  Hemoglobin today is 10.3.  Iron stores are within normal limits.  She does not require additional Feraheme  today.  Patient last received treatment on August 30, 2017.  Return to clinic in 3 months as above.  3.  Leukopenia: Patient's white blood cell count is within normal limits today. 4.  Cirrhosis/ascites: Patient reports she does not have a GI doctor, therefore a referral was given for further evaluation.  Approximately 30 minutes was spent in discussion of which greater than 50% was consultation.    Patient expressed understanding and was in agreement with this plan. She also understands that She can call clinic at any time with any questions, concerns, or complaints.    [name], MD   11/25/2017 7:18 AM

## 2017-11-22 ENCOUNTER — Inpatient Hospital Stay: Payer: Medicare Other

## 2017-11-22 ENCOUNTER — Inpatient Hospital Stay (HOSPITAL_BASED_OUTPATIENT_CLINIC_OR_DEPARTMENT_OTHER): Payer: Medicare Other | Admitting: Oncology

## 2017-11-22 ENCOUNTER — Other Ambulatory Visit: Payer: Self-pay

## 2017-11-22 ENCOUNTER — Encounter: Payer: Self-pay | Admitting: Oncology

## 2017-11-22 ENCOUNTER — Inpatient Hospital Stay: Payer: Medicare Other | Attending: Oncology

## 2017-11-22 VITALS — BP 147/70 | HR 100 | Temp 96.2°F | Resp 20 | Wt 117.6 lb

## 2017-11-22 DIAGNOSIS — Z9981 Dependence on supplemental oxygen: Secondary | ICD-10-CM

## 2017-11-22 DIAGNOSIS — D696 Thrombocytopenia, unspecified: Secondary | ICD-10-CM | POA: Diagnosis present

## 2017-11-22 DIAGNOSIS — K7460 Unspecified cirrhosis of liver: Secondary | ICD-10-CM | POA: Diagnosis not present

## 2017-11-22 DIAGNOSIS — K7469 Other cirrhosis of liver: Secondary | ICD-10-CM

## 2017-11-22 DIAGNOSIS — D509 Iron deficiency anemia, unspecified: Secondary | ICD-10-CM | POA: Diagnosis not present

## 2017-11-22 DIAGNOSIS — R188 Other ascites: Secondary | ICD-10-CM | POA: Diagnosis not present

## 2017-11-22 DIAGNOSIS — R0602 Shortness of breath: Secondary | ICD-10-CM | POA: Insufficient documentation

## 2017-11-22 LAB — CBC WITH DIFFERENTIAL/PLATELET
BASOS ABS: 0 10*3/uL (ref 0–0.1)
Basophils Relative: 1 %
EOS PCT: 5 %
Eosinophils Absolute: 0.2 10*3/uL (ref 0–0.7)
HCT: 31.4 % — ABNORMAL LOW (ref 35.0–47.0)
HEMOGLOBIN: 10.3 g/dL — AB (ref 12.0–16.0)
LYMPHS ABS: 0.4 10*3/uL — AB (ref 1.0–3.6)
Lymphocytes Relative: 9 %
MCH: 27.2 pg (ref 26.0–34.0)
MCHC: 32.9 g/dL (ref 32.0–36.0)
MCV: 82.7 fL (ref 80.0–100.0)
Monocytes Absolute: 0.3 10*3/uL (ref 0.2–0.9)
Monocytes Relative: 9 %
NEUTROS ABS: 3 10*3/uL (ref 1.4–6.5)
NEUTROS PCT: 76 %
PLATELETS: 57 10*3/uL — AB (ref 150–440)
RBC: 3.8 MIL/uL (ref 3.80–5.20)
RDW: 15.7 % — ABNORMAL HIGH (ref 11.5–14.5)
WBC: 3.9 10*3/uL (ref 3.6–11.0)

## 2017-11-22 LAB — FERRITIN: FERRITIN: 327 ng/mL — AB (ref 11–307)

## 2017-11-22 LAB — IRON AND TIBC
Iron: 65 ug/dL (ref 28–170)
Saturation Ratios: 29 % (ref 10.4–31.8)
TIBC: 222 ug/dL — AB (ref 250–450)
UIBC: 157 ug/dL

## 2017-11-22 NOTE — Progress Notes (Signed)
Pt here for follow up. Pt stated feeling better today-energy wise. Showered and dressed . Stated she has been sick w bronchitis or severed URI-diff opinions she stated. Finished antibiotics and prednisone. On 02 @ 2l. Stated was constipated and now some diarrhea she stated.yesterday moved bowels 6 x  Once today per pt.  Feeling bloated today she stated.

## 2017-12-30 ENCOUNTER — Inpatient Hospital Stay
Admission: EM | Admit: 2017-12-30 | Discharge: 2018-01-02 | DRG: 432 | Disposition: A | Discharge status: Home Health | Payer: Medicare Other | Attending: Internal Medicine | Admitting: Internal Medicine

## 2017-12-30 ENCOUNTER — Inpatient Hospital Stay: Payer: Medicare Other

## 2017-12-30 ENCOUNTER — Emergency Department: Payer: Medicare Other

## 2017-12-30 ENCOUNTER — Other Ambulatory Visit: Payer: Self-pay

## 2017-12-30 DIAGNOSIS — Z87442 Personal history of urinary calculi: Secondary | ICD-10-CM

## 2017-12-30 DIAGNOSIS — E039 Hypothyroidism, unspecified: Secondary | ICD-10-CM | POA: Diagnosis present

## 2017-12-30 DIAGNOSIS — T380X5A Adverse effect of glucocorticoids and synthetic analogues, initial encounter: Secondary | ICD-10-CM | POA: Diagnosis present

## 2017-12-30 DIAGNOSIS — I8511 Secondary esophageal varices with bleeding: Secondary | ICD-10-CM | POA: Diagnosis present

## 2017-12-30 DIAGNOSIS — Z9842 Cataract extraction status, left eye: Secondary | ICD-10-CM

## 2017-12-30 DIAGNOSIS — Z8249 Family history of ischemic heart disease and other diseases of the circulatory system: Secondary | ICD-10-CM

## 2017-12-30 DIAGNOSIS — Z9981 Dependence on supplemental oxygen: Secondary | ICD-10-CM | POA: Diagnosis not present

## 2017-12-30 DIAGNOSIS — K219 Gastro-esophageal reflux disease without esophagitis: Secondary | ICD-10-CM | POA: Diagnosis present

## 2017-12-30 DIAGNOSIS — Z820 Family history of epilepsy and other diseases of the nervous system: Secondary | ICD-10-CM

## 2017-12-30 DIAGNOSIS — K3189 Other diseases of stomach and duodenum: Secondary | ICD-10-CM | POA: Diagnosis present

## 2017-12-30 DIAGNOSIS — K7290 Hepatic failure, unspecified without coma: Secondary | ICD-10-CM

## 2017-12-30 DIAGNOSIS — E785 Hyperlipidemia, unspecified: Secondary | ICD-10-CM | POA: Diagnosis present

## 2017-12-30 DIAGNOSIS — E1165 Type 2 diabetes mellitus with hyperglycemia: Secondary | ICD-10-CM | POA: Diagnosis present

## 2017-12-30 DIAGNOSIS — Z87891 Personal history of nicotine dependence: Secondary | ICD-10-CM

## 2017-12-30 DIAGNOSIS — J189 Pneumonia, unspecified organism: Secondary | ICD-10-CM

## 2017-12-30 DIAGNOSIS — Z9841 Cataract extraction status, right eye: Secondary | ICD-10-CM

## 2017-12-30 DIAGNOSIS — J181 Lobar pneumonia, unspecified organism: Secondary | ICD-10-CM | POA: Diagnosis present

## 2017-12-30 DIAGNOSIS — Z833 Family history of diabetes mellitus: Secondary | ICD-10-CM

## 2017-12-30 DIAGNOSIS — Z888 Allergy status to other drugs, medicaments and biological substances status: Secondary | ICD-10-CM

## 2017-12-30 DIAGNOSIS — Z809 Family history of malignant neoplasm, unspecified: Secondary | ICD-10-CM | POA: Diagnosis not present

## 2017-12-30 DIAGNOSIS — D693 Immune thrombocytopenic purpura: Secondary | ICD-10-CM | POA: Diagnosis present

## 2017-12-30 DIAGNOSIS — Z79890 Hormone replacement therapy (postmenopausal): Secondary | ICD-10-CM

## 2017-12-30 DIAGNOSIS — J9611 Chronic respiratory failure with hypoxia: Secondary | ICD-10-CM | POA: Diagnosis present

## 2017-12-30 DIAGNOSIS — J432 Centrilobular emphysema: Secondary | ICD-10-CM | POA: Diagnosis present

## 2017-12-30 DIAGNOSIS — K766 Portal hypertension: Secondary | ICD-10-CM | POA: Diagnosis present

## 2017-12-30 DIAGNOSIS — Z79899 Other long term (current) drug therapy: Secondary | ICD-10-CM | POA: Diagnosis not present

## 2017-12-30 DIAGNOSIS — Z7951 Long term (current) use of inhaled steroids: Secondary | ICD-10-CM | POA: Diagnosis not present

## 2017-12-30 DIAGNOSIS — K7460 Unspecified cirrhosis of liver: Secondary | ICD-10-CM | POA: Diagnosis present

## 2017-12-30 DIAGNOSIS — K922 Gastrointestinal hemorrhage, unspecified: Secondary | ICD-10-CM

## 2017-12-30 DIAGNOSIS — K921 Melena: Secondary | ICD-10-CM | POA: Diagnosis not present

## 2017-12-30 DIAGNOSIS — D62 Acute posthemorrhagic anemia: Secondary | ICD-10-CM | POA: Diagnosis present

## 2017-12-30 DIAGNOSIS — I8510 Secondary esophageal varices without bleeding: Secondary | ICD-10-CM | POA: Diagnosis not present

## 2017-12-30 DIAGNOSIS — R188 Other ascites: Secondary | ICD-10-CM

## 2017-12-30 DIAGNOSIS — E1122 Type 2 diabetes mellitus with diabetic chronic kidney disease: Secondary | ICD-10-CM | POA: Diagnosis present

## 2017-12-30 DIAGNOSIS — N183 Chronic kidney disease, stage 3 (moderate): Secondary | ICD-10-CM | POA: Diagnosis present

## 2017-12-30 DIAGNOSIS — Z825 Family history of asthma and other chronic lower respiratory diseases: Secondary | ICD-10-CM | POA: Diagnosis not present

## 2017-12-30 DIAGNOSIS — Z881 Allergy status to other antibiotic agents status: Secondary | ICD-10-CM

## 2017-12-30 DIAGNOSIS — Z885 Allergy status to narcotic agent status: Secondary | ICD-10-CM

## 2017-12-30 LAB — COMPREHENSIVE METABOLIC PANEL
ALT: 17 U/L (ref 14–54)
ANION GAP: 11 (ref 5–15)
AST: 32 U/L (ref 15–41)
Albumin: 3.1 g/dL — ABNORMAL LOW (ref 3.5–5.0)
Alkaline Phosphatase: 86 U/L (ref 38–126)
BILIRUBIN TOTAL: 1.3 mg/dL — AB (ref 0.3–1.2)
BUN: 30 mg/dL — AB (ref 6–20)
CO2: 31 mmol/L (ref 22–32)
Calcium: 9 mg/dL (ref 8.9–10.3)
Chloride: 94 mmol/L — ABNORMAL LOW (ref 101–111)
Creatinine, Ser: 1.73 mg/dL — ABNORMAL HIGH (ref 0.44–1.00)
GFR calc Af Amer: 33 mL/min — ABNORMAL LOW (ref >60)
GFR, EST NON AFRICAN AMERICAN: 29 mL/min — AB (ref >60)
Glucose, Bld: 311 mg/dL — ABNORMAL HIGH (ref 65–99)
POTASSIUM: 3.7 mmol/L (ref 3.5–5.1)
Sodium: 136 mmol/L (ref 135–145)
TOTAL PROTEIN: 6.1 g/dL — AB (ref 6.5–8.1)

## 2017-12-30 LAB — ALBUMIN, PLEURAL OR PERITONEAL FLUID: Albumin, Fluid: <1 g/dL

## 2017-12-30 LAB — CBC
HEMATOCRIT: 30.4 % — AB (ref 35.0–47.0)
Hemoglobin: 10 g/dL — ABNORMAL LOW (ref 12.0–16.0)
MCH: 27 pg (ref 26.0–34.0)
MCHC: 32.8 g/dL (ref 32.0–36.0)
MCV: 82.4 fL (ref 80.0–100.0)
Platelets: 87 10*3/uL — ABNORMAL LOW (ref 150–440)
RBC: 3.69 MIL/uL — ABNORMAL LOW (ref 3.80–5.20)
RDW: 15.6 % — AB (ref 11.5–14.5)
WBC: 7.2 10*3/uL (ref 3.6–11.0)

## 2017-12-30 LAB — BODY FLUID CELL COUNT WITH DIFFERENTIAL
EOS FL: 0 %
LYMPHS FL: 27 %
MONOCYTE-MACROPHAGE-SEROUS FLUID: 52 %
Neutrophil Count, Fluid: 21 %
Other Cells, Fluid: 0 %
Total Nucleated Cell Count, Fluid: 36 cu mm

## 2017-12-30 LAB — BLOOD GAS, VENOUS
Acid-Base Excess: 6.2 mmol/L — ABNORMAL HIGH (ref 0.0–2.0)
Bicarbonate: 35.7 mmol/L — ABNORMAL HIGH (ref 20.0–28.0)
O2 Saturation: UNDETERMINED %
PATIENT TEMPERATURE: 37
PCO2 VEN: 76 mmHg — AB (ref 44.0–60.0)
PH VEN: 7.28 (ref 7.250–7.430)

## 2017-12-30 LAB — PROTEIN, PLEURAL OR PERITONEAL FLUID

## 2017-12-30 LAB — PROTIME-INR
INR: 1.34
PROTHROMBIN TIME: 16.5 s — AB (ref 11.4–15.2)

## 2017-12-30 LAB — TYPE AND SCREEN
ABO/RH(D): A POS
ANTIBODY SCREEN: NEGATIVE

## 2017-12-30 LAB — GLUCOSE, PLEURAL OR PERITONEAL FLUID: GLUCOSE FL: 359 mg/dL

## 2017-12-30 MED ORDER — IPRATROPIUM-ALBUTEROL 0.5-2.5 (3) MG/3ML IN SOLN: 3.0000 mL | Freq: Once | RESPIRATORY_TRACT | Status: DC

## 2017-12-30 MED ORDER — SODIUM CHLORIDE 0.9 % IV SOLN
500.0000 mg | Freq: Once | INTRAVENOUS | Status: AC
  Administered 2017-12-30: 500 mg via INTRAVENOUS
  Filled 2017-12-30: qty 500

## 2017-12-30 MED ORDER — SODIUM CHLORIDE 0.9 % IV SOLN
8.0000 mg/h | INTRAVENOUS | Status: DC
  Administered 2017-12-30 – 2017-12-31 (×2): 8 mg/h via INTRAVENOUS
  Filled 2017-12-30 (×3): qty 80

## 2017-12-30 MED ORDER — FLUTICASONE PROPIONATE 50 MCG/ACT NA SUSP
2.0000 | Freq: Every day | NASAL | Status: DC
  Administered 2017-12-30 – 2017-12-31 (×2): 2 via NASAL
  Filled 2017-12-30: qty 16

## 2017-12-30 MED ORDER — ALBUTEROL SULFATE (2.5 MG/3ML) 0.083% IN NEBU
INHALATION_SOLUTION | RESPIRATORY_TRACT | Status: AC
  Filled 2017-12-30: qty 9

## 2017-12-30 MED ORDER — LEVOTHYROXINE SODIUM 50 MCG PO TABS
75.0000 ug | ORAL_TABLET | Freq: Every day | ORAL | Status: DC
  Administered 2017-12-31 – 2018-01-02 (×3): 75 ug via ORAL
  Filled 2017-12-30 (×3): qty 2

## 2017-12-30 MED ORDER — FUROSEMIDE 20 MG PO TABS: 20.0000 mg | ORAL_TABLET | Freq: Two times a day (BID) | ORAL | Status: DC

## 2017-12-30 MED ORDER — ONDANSETRON HCL 4 MG/2ML IJ SOLN: 4.0000 mg | Freq: Four times a day (QID) | INTRAMUSCULAR | Status: DC | PRN

## 2017-12-30 MED ORDER — SODIUM CHLORIDE 0.9 % IV SOLN
1.0000 g | INTRAVENOUS | Status: DC
  Administered 2017-12-31: 1 g via INTRAVENOUS
  Filled 2017-12-30: qty 1
  Filled 2017-12-30: qty 10

## 2017-12-30 MED ORDER — DEXTROSE 5 % IV SOLN
250.0000 mg | INTRAVENOUS | Status: DC
  Administered 2017-12-31: 250 mg via INTRAVENOUS
  Filled 2017-12-30 (×2): qty 250

## 2017-12-30 MED ORDER — GABAPENTIN 300 MG PO CAPS
300.0000 mg | ORAL_CAPSULE | Freq: Three times a day (TID) | ORAL | Status: DC
  Administered 2017-12-31: 300 mg via ORAL
  Filled 2017-12-30 (×5): qty 1

## 2017-12-30 MED ORDER — FUROSEMIDE 40 MG PO TABS: 40.0000 mg | ORAL_TABLET | Freq: Every day | ORAL | Status: DC

## 2017-12-30 MED ORDER — DOCUSATE SODIUM 100 MG PO CAPS: 100.0000 mg | ORAL_CAPSULE | Freq: Two times a day (BID) | ORAL | Status: DC | PRN

## 2017-12-30 MED ORDER — SODIUM CHLORIDE 0.9 % IV SOLN
80.0000 mg | Freq: Once | INTRAVENOUS | Status: AC
  Administered 2017-12-30: 80 mg via INTRAVENOUS
  Filled 2017-12-30: qty 80

## 2017-12-30 MED ORDER — ALBUTEROL SULFATE (2.5 MG/3ML) 0.083% IN NEBU
7.5000 mg | INHALATION_SOLUTION | Freq: Once | RESPIRATORY_TRACT | Status: AC
  Administered 2017-12-30: 7.5 mg via RESPIRATORY_TRACT

## 2017-12-30 MED ORDER — SODIUM CHLORIDE 0.9 % IV SOLN
50.0000 ug/h | INTRAVENOUS | Status: DC
  Administered 2017-12-30 – 2018-01-02 (×6): 50 ug/h via INTRAVENOUS
  Filled 2017-12-30 (×17): qty 1

## 2017-12-30 MED ORDER — OCTREOTIDE LOAD VIA INFUSION
50.0000 ug | Freq: Once | INTRAVENOUS | Status: AC
  Administered 2017-12-30: 50 ug via INTRAVENOUS
  Filled 2017-12-30: qty 25

## 2017-12-30 MED ORDER — SODIUM CHLORIDE 0.9 % IV SOLN
1.0000 g | Freq: Once | INTRAVENOUS | Status: AC
  Administered 2017-12-30: 1 g via INTRAVENOUS
  Filled 2017-12-30: qty 10

## 2017-12-30 MED ORDER — LORATADINE 10 MG PO TABS: 10.0000 mg | ORAL_TABLET | Freq: Every day | ORAL | Status: DC | PRN

## 2017-12-30 MED ORDER — METHYLPREDNISOLONE SODIUM SUCC 125 MG IJ SOLR
INTRAMUSCULAR | Status: AC
  Administered 2017-12-30: 125 mg via INTRAVENOUS
  Filled 2017-12-30: qty 2

## 2017-12-30 MED ORDER — FUROSEMIDE 10 MG/ML IJ SOLN
40.0000 mg | Freq: Two times a day (BID) | INTRAMUSCULAR | Status: DC
  Administered 2017-12-30 – 2017-12-31 (×2): 40 mg via INTRAVENOUS
  Filled 2017-12-30 (×2): qty 4

## 2017-12-30 MED ORDER — PANTOPRAZOLE SODIUM 40 MG PO TBEC
40.0000 mg | DELAYED_RELEASE_TABLET | Freq: Two times a day (BID) | ORAL | Status: DC
  Administered 2017-12-30: 40 mg via ORAL
  Filled 2017-12-30: qty 1

## 2017-12-30 MED ORDER — OXYCODONE-ACETAMINOPHEN 5-325 MG PO TABS: 1.0000 | ORAL_TABLET | Freq: Four times a day (QID) | ORAL | Status: DC | PRN

## 2017-12-30 MED ORDER — SPIRONOLACTONE 100 MG PO TABS
100.0000 mg | ORAL_TABLET | Freq: Every day | ORAL | Status: DC
  Administered 2017-12-31 – 2018-01-02 (×3): 100 mg via ORAL
  Filled 2017-12-30 (×3): qty 4
  Filled 2017-12-30 (×3): qty 1

## 2017-12-30 MED ORDER — SPIRONOLACTONE 25 MG PO TABS
50.0000 mg | ORAL_TABLET | Freq: Every day | ORAL | Status: DC
  Administered 2017-12-30: 50 mg via ORAL
  Filled 2017-12-30: qty 2

## 2017-12-30 MED ORDER — FLUOXETINE HCL 20 MG PO CAPS
20.0000 mg | ORAL_CAPSULE | Freq: Every day | ORAL | Status: DC
  Administered 2017-12-30 – 2018-01-02 (×4): 20 mg via ORAL
  Filled 2017-12-30 (×4): qty 1

## 2017-12-30 MED ORDER — ALBUTEROL SULFATE (2.5 MG/3ML) 0.083% IN NEBU
INHALATION_SOLUTION | Freq: Four times a day (QID) | RESPIRATORY_TRACT | Status: DC | PRN
  Administered 2018-01-01: 2.5 mg via RESPIRATORY_TRACT
  Filled 2017-12-30: qty 3

## 2017-12-30 MED ORDER — ALBUMIN HUMAN 25 % IV SOLN
50.0000 g | Freq: Once | INTRAVENOUS | Status: AC
  Administered 2017-12-30: 50 g via INTRAVENOUS
  Filled 2017-12-30: qty 200

## 2017-12-30 MED ORDER — IPRATROPIUM-ALBUTEROL 0.5-2.5 (3) MG/3ML IN SOLN
RESPIRATORY_TRACT | Status: AC
  Filled 2017-12-30: qty 6

## 2017-12-30 MED ORDER — METHYLPREDNISOLONE SODIUM SUCC 125 MG IJ SOLR
60.0000 mg | INTRAMUSCULAR | Status: DC
  Administered 2017-12-31 – 2018-01-01 (×2): 60 mg via INTRAVENOUS
  Filled 2017-12-30 (×2): qty 2

## 2017-12-30 MED ORDER — METHYLPREDNISOLONE SODIUM SUCC 125 MG IJ SOLR
125.0000 mg | Freq: Once | INTRAMUSCULAR | Status: AC
  Administered 2017-12-30: 125 mg via INTRAVENOUS

## 2017-12-30 MED ORDER — METHYLPREDNISOLONE SODIUM SUCC 125 MG IJ SOLR: 60.0000 mg | INTRAMUSCULAR | Status: DC

## 2017-12-30 NOTE — Consult Note (Signed)
Cephas Darby, MD [address]  [place]  [place], [place] [address]  Main: [phone]  Fax: [phone] Pager: [phone]   Consultation  Referring Provider:     No ref. provider found Primary Care Physician:  Derinda Late, MD Primary Gastroenterologist:  Dr. Sherri Sear         Reason for Consultation:     Decompensated cirrhosis, ascites, melena  Date of Admission:  12/30/2017 Date of Consultation:  12/30/2017         HPI:   Angela Austin is a 71 y.o. female with known history of decompensated cirrhosis, presents with 2 month history of progressively worsening abdominal distention and swelling of legs, she also noticed black tarry stools since this morning which made her worried and came to the ER. She used to see Dr. Verdie Shire until 2015. She is on furosemide 20 mg twice daily and spironolactone 50 mg daily. She reports that she is diagnosed with cirrhosis about 5 years ago when she presented with GI bleed. Her viral hepatitis panel was negative, anti-smooth muscle antibodies and antimitochondrial antibodies were negative. She thinks that her cirrhosis is secondary to long-term metformin use. She also has COPD and oxygen dependent. She had an ultrasound on admission and was found to have cirrhosis with moderate ascites and paracentesis is pending. She denies nausea, vomiting, rectal bleeding, abdominal pain, other than discomfort, fever, chills. 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 She sees Dr. Grayland Ormond at [facility] for chronic thrombocytopenia.   NSAIDs: none  Antiplts/Anticoagulants/Anti thrombotics: none  GI Procedures: she had EGD on 05/23/2014 at [facility] not available She never had a colonoscopy   Past Medical History:  Diagnosis Date  . Cirrhosis of liver not due to alcohol ([facility])   . COPD (chronic obstructive pulmonary disease) ([facility])   . Depression   . Diabetes ([place])   . GERD  (gastroesophageal reflux disease)   . Hypothyroidism   . ITP (idiopathic thrombocytopenic purpura)   . Nephrolithiasis     Past Surgical History:  Procedure Laterality Date  . CATARACT EXTRACTION    . LUMBAR DISC SURGERY    . TONSILLECTOMY AND ADENOIDECTOMY      Prior to Admission medications   Medication Sig Start Date End Date Taking? Authorizing Provider  albuterol (ACCUNEB) 1.25 MG/3ML nebulizer solution Take 1 ampule by nebulization every 6 (six) hours as needed for wheezing.   Yes [provider]  albuterol (PROVENTIL HFA) 108 (90 Base) MCG/ACT inhaler Inhale 1 puff into the lungs every 4 (four) hours as needed.    Yes [provider]  FLUoxetine (PROZAC) 20 MG tablet Take 20 mg by mouth daily.   Yes [provider]  fluticasone (FLONASE) 50 MCG/ACT nasal spray PLACE 2 SPRAYS INTO BOTH NOSTRILS ONCE DAILY AS NEEDED FOR ALLERGIES 11/27/15  Yes [provider]  furosemide (LASIX) 20 MG tablet Take 20 mg by mouth 2 (two) times daily.   Yes [provider]  levothyroxine (SYNTHROID, LEVOTHROID) 75 MCG tablet Take 75 mcg by mouth daily before breakfast.  01/07/16  Yes [provider]  loratadine (CLARITIN) 10 MG tablet Take 10 mg by mouth daily as needed for allergies.   Yes [provider]  pantoprazole (PROTONIX) 40 MG tablet Take 40 mg by mouth 2 (two) times daily.   Yes [provider]  spironolactone (ALDACTONE) 50 MG tablet Take 50 mg by mouth 2 (two) times daily.  Yes [provider]    Family History  Problem Relation Age of Onset  . Alzheimer's disease Mother   . COPD Mother   . Cancer Mother   . COPD Father   . Diabetes Mellitus II Father   . Heart attack Father      Social History   Tobacco Use  . Smoking status: Former Smoker    Years: 25.00    Types: Cigarettes  . Smokeless tobacco: Never Used  Substance Use Topics  . Alcohol use: No    Alcohol/week: 0.0 oz  . Drug use: No     Allergies as of 12/30/2017 - Review Complete 12/30/2017  Allergen Reaction Noted  . Metformin Diarrhea 12/13/2013  . Amoxicillin-pot clavulanate Other (See Comments) 12/13/2013  . Codeine  07/03/2014  . Erythromycin  07/03/2014  . Erythromycin ethylsuccinate  12/13/2013  . Levofloxacin Other (See Comments) 11/24/2015  . Metformin and related  07/14/2014  . Morphine Other (See Comments) and Nausea And Vomiting 12/13/2013  . Morphine and related  07/03/2014  . Morphine sulfate er beads Other (See Comments) 02/10/2015  . Omeprazole Other (See Comments) 12/13/2013  . Spiriva handihaler [tiotropium bromide monohydrate] Swelling 07/03/2014  . Statins Other (See Comments) 12/13/2013  . Tetracyclines & related  07/03/2014  . Ace inhibitors Rash 12/13/2013    Review of Systems:    All systems reviewed and negative except where noted in HPI.   Physical Exam:  Vital signs in last 24 hours: Temp:  [98.1 F (36.7 C)-98.6 F (37 C)] 98.6 F (37 C) (06/01 1218) Pulse Rate:  [107-121] 107 (06/01 1511) Resp:  [18-29] 18 (06/01 1218) BP: (129-186)/(46-76) 154/54 (06/01 1511) SpO2:  [94 %-100 %] 95 % (06/01 1511) Weight:  [124 lb (56.2 kg)] 124 lb (56.2 kg) (06/01 0620)   General:  Ill-appearing, cachectic, mild distress due to abdominal distention Head:  Normocephalic and atraumatic, bitemporal wasting. Eyes:   No icterus.   Conjunctiva pink. PERRLA. Ears:  Normal auditory acuity. Neck:  Supple; no masses or thyroidomegaly Lungs: Respirations even and unlabored. Lungs wheezing to auscultation bilaterally.  Heart:  Tachycardia, Regular rhythm;  Without murmur, clicks, rubs or gallops Abdomen:  Soft, markedly distended, diffuse, dull to percussion, nontender. Normal bowel sounds. No rebound or guarding.  Rectal:  Not performed. Msk:  Significant muscle wasting in all extremities Extremities:  2+ edema, cyanosis or clubbing. Neurologic:  Alert and oriented x3;  grossly normal  neurologically. Skin:  Intact without significant lesions or rashes. Cervical Nodes:  No significant cervical adenopathy. Psych:  Alert and cooperative. Normal affect.  LAB RESULTS: CBC Latest Ref Rng & Units 12/30/2017 11/22/2017 11/07/2017  WBC 3.6 - 11.0 K/uL 7.2 3.9 4.7  Hemoglobin 12.0 - 16.0 g/dL 10.0(L) 10.3(L) 10.8(L)  Hematocrit 35.0 - 47.0 % 30.4(L) 31.4(L) 33.2(L)  Platelets 150 - 440 K/uL 87(L) 57(L) 69(L)    BMET BMP Latest Ref Rng & Units 12/30/2017 11/07/2017 10/28/2017  Glucose 65 - 99 mg/dL 311(H) 217(H) 480(H)  BUN 6 - 20 mg/dL 30(H) 25(H) 31(H)  Creatinine 0.44 - 1.00 mg/dL 1.73(H) 1.50(H) 1.78(H)  Sodium 135 - 145 mmol/L 136 132(L) 131(L)  Potassium 3.5 - 5.1 mmol/L 3.7 4.4 4.0  Chloride 101 - 111 mmol/L 94(L) 91(L) 88(L)  CO2 22 - 32 mmol/L 31 34(H) 28  Calcium 8.9 - 10.3 mg/dL 9.0 8.7(L) 8.8(L)    LFT Hepatic Function Latest Ref Rng & Units 12/30/2017 11/07/2017 07/23/2014  Total Protein 6.5 - 8.1 g/dL 6.1(L) 5.9(L) -  Albumin 3.5 - 5.0 g/dL 3.1(L) 3.2(L) -  AST 15 - 41 U/L 32 43(H) 24  ALT 14 - 54 U/L _0 Alk Phosphatase 38 - 126 U/L 86 77 58  Total Bilirubin 0.3 - 1.2 mg/dL 1.3(H) 1.4(H) -  Bilirubin, Direct 0.01 - 0.4 mg/dL - - 0.2     STUDIES: US Paracentesis  Result Date: 12/30/2017 INDICATION: NASH cirrhosis. Abdominal distension. Ascites. Request for paracentesis EXAM: ULTRASOUND GUIDED RIGHT LOWER QUADRANT PARACENTESIS MEDICATIONS: None. COMPLICATIONS: None immediate. PROCEDURE: Informed written consent was obtained from the patient after a discussion of the risks, benefits and alternatives to treatment. A timeout was performed prior to the initiation of the procedure. Initial ultrasound scanning demonstrates a large amount of ascites within the right lower abdominal quadrant. The right lower abdomen was prepped and draped in the usual sterile fashion. 1% lidocaine with epinephrine was used for local anesthesia. Following this, a 6 Fr Safe-T-Centesis  catheter was introduced. An ultrasound image was saved for documentation purposes. The paracentesis was performed. The catheter was removed and a dressing was applied. The patient tolerated the procedure well without immediate post procedural complication. FINDINGS: A total of approximately 7.4 L of clear, amber fluid was removed. IMPRESSION: Successful ultrasound-guided paracentesis yielding 7.4 liters of peritoneal fluid. Read by Ascencion Dike PA-C Electronically Signed   By: Sandi Mariscal M.D.   On: 12/30/2017 15:21   Dg Chest Portable 1 View  Result Date: 12/30/2017 CLINICAL DATA:  Acute onset of shortness of breath and dyspnea. EXAM: PORTABLE CHEST 1 VIEW COMPARISON:  Chest radiograph performed 11/07/2017 FINDINGS: The lungs are well-aerated. Mild left basilar airspace opacity is compatible with pneumonia. Underlying emphysema is noted. There is no evidence of pleural effusion or pneumothorax. The cardiomediastinal silhouette is within normal limits. No acute osseous abnormalities are seen. IMPRESSION: Mild left basilar pneumonia. Underlying emphysema noted. Electronically Signed   By: Garald Balding M.D.   On: 12/30/2017 07:10   [place] Ascites (abdomen Limited)  Result Date: 12/30/2017 CLINICAL DATA:  Evaluate for ascites. EXAM: LIMITED ABDOMEN ULTRASOUND FOR ASCITES TECHNIQUE: Limited ultrasound survey for ascites was performed in all four abdominal quadrants. COMPARISON:  None. FINDINGS: Moderate amount of ascites is noted in the abdomen, most evident in the lower quadrants. Liver appears smaller and nodular with a coarsened echotexture consistent with cirrhosis. IMPRESSION: Moderate ascites. Electronically Signed   By: Lajean Manes M.D.   On: 12/30/2017 09:27      Impression / Plan:   Angela Austin is a 72 y.o. 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 Hepatitis B and C negative, AMA, ASMA negative in the past  Agree with therapeutic paracentesis, patient had 7.4L of clear, amber fluid removed today Fluid analysis is pending Increase furosemide to 40 mg IV twice a day and spironolactone 100 mg daily Monitor creatinine and electrolytes closely, replete as needed Discussed with patient about strict low-sodium intake Ultrasound liver did not reveal liver lesions EGD as above No evidence of encephalopathy Mild anemia and Thrombocytopenia which is at baseline  I have discussed alternative options, risks & benefits,  which include, but are not limited to, bleeding, infection, perforation,respiratory complication & drug reaction.  The patient agrees with this plan & written consent  will be obtained.    Thank you for involving me in the care of this patient.  We will follow along with you    LOS: 0 days   Sherri Sear, MD  12/30/2017, 4:16 PM   Note: This dictation was prepared with Dragon dictation along with smaller phrase technology. Any transcriptional errors that result from this process are unintentional.

## 2017-12-30 NOTE — Procedures (Addendum)
PROCEDURE SUMMARY:  Successful US guided paracentesis from RLQ.  Yielded 7.4 L of clear amber fluid.  No immediate complications.  Pt tolerated well.   Specimen was obtained if labs requested.  Brayton ElBRUNING, Jani Ploeger PA-C 12/30/2017 2:36 PM

## 2017-12-30 NOTE — ED Notes (Signed)
Admitting MD at bedside.

## 2017-12-30 NOTE — ED Notes (Signed)
Sarah, RN to transport pt to 2C-215. Jola BabinskiMarilyn, 2C secretary aware that patient is en route.

## 2017-12-30 NOTE — ED Notes (Signed)
Patient transported to US 

## 2017-12-30 NOTE — ED Triage Notes (Signed)
Pt arrived from home per [facility] with complaints of rectal bleeding and SOB. Pt has Hx of rectal bleeds and cirrhosis of the liver. Pt reported that the blood was black in color, nausea, and sob. Pt has extreme ascites of the abdomen. Pt is normally on 2L oxygen at home and uses inhaler. Pt states an allergy to penicillin and morphine. Pt has a Doctor's appointment on Monday. EMS gave Albuterol en route. VS per EMS BP-193/84 HR-112. Pt alert and oriented x 4. EMS reported wheezing.

## 2017-12-30 NOTE — Progress Notes (Signed)
Non alcoholic liver cirrhosis, worsening ascites for last few months, along with respi distress.  Have black stool also.  Get GI consult, US for ascites and may need tap. Solumedrol and azithromycin for possible pneumonia.

## 2017-12-30 NOTE — ED Provider Notes (Signed)
[facility] Emergency Department Provider Note   First MD Initiated Contact with Patient 12/30/17 [phone]     (approximate)  I have reviewed the triage vital signs and the nursing notes.   HISTORY  Chief Complaint Rectal Bleeding and Shortness of Breath    HPI Angela Austin is a 72 y.o. female of chronic medical conditions including cirrhosis of the liver, COPD previous GI bleeds both upper and lower resents to the emergency department with dark stools and dyspnea as well as increasing abdominal distention.  Patient states that she is never had a centesis performed to date.  Patient denies any fever.  Patient denies any chest pain.   Past Medical History:  Diagnosis Date  . Cirrhosis of liver not due to alcohol ([facility])   . COPD (chronic obstructive pulmonary disease) ([facility])   . Depression   . Diabetes ([facility])   . GERD (gastroesophageal reflux disease)   . Hypothyroidism   . ITP (idiopathic thrombocytopenic purpura)   . Nephrolithiasis     Patient Active Problem List   Diagnosis Date Noted  . Thrombocytopenia ([facility]) 02/10/2015  . Physical deconditioning 07/03/2014  . COPD exacerbation ([facility]CC) 07/03/2014  . Cirrhosis of liver not due to alcohol ([facility]) 07/03/2014  . Essential hypertension, benign 07/03/2014  . Anemia, iron deficiency 07/03/2014  . Major depression 07/03/2014  . Esophageal varices in cirrhosis ([facility]) 07/03/2014  . Secondary esophageal varices with bleeding ([facility]) 06/06/2014  . Centrilobular emphysema ([facility]) 12/24/2013  . Other and unspecified hyperlipidemia 12/24/2013  . Type II or unspecified type diabetes mellitus without mention of complication, uncontrolled 12/24/2013  . Hypothyroidism 12/24/2013    Past Surgical History:  Procedure Laterality Date  . CATARACT EXTRACTION    . LUMBAR DISC SURGERY    . TONSILLECTOMY AND ADENOIDECTOMY      Prior to Admission medications   Medication Sig Start Date End Date Taking? Authorizing  Provider  albuterol (ACCUNEB) 1.25 MG/3ML nebulizer solution Take 1 ampule by nebulization every 6 (six) hours as needed for wheezing.    [provider]  albuterol (PROVENTIL HFA) 108 (90 Base) MCG/ACT inhaler Inhale 1 puff into the lungs every 4 (four) hours as needed.     [provider]  diclofenac sodium (VOLTAREN) 1 % GEL Apply 2 g topically 4 (four) times daily.    [provider]  FLUoxetine (PROZAC) 20 MG tablet Take 20 mg by mouth daily.    [provider]  fluticasone (FLONASE) 50 MCG/ACT nasal spray PLACE 2 SPRAYS INTO BOTH NOSTRILS ONCE DAILY. 11/27/15   [provider]  furosemide (LASIX) 20 MG tablet Take 20 mg by mouth 2 (two) times daily.    [provider]  gabapentin (NEURONTIN) 300 MG capsule Take 300 mg by mouth 3 (three) times daily.    [provider]  levothyroxine (SYNTHROID, LEVOTHROID) 75 MCG tablet  01/07/16   [provider]  loratadine (CLARITIN) 10 MG tablet Take 10 mg by mouth daily as needed for allergies.    [provider]  pantoprazole (PROTONIX) 40 MG tablet Take 40 mg by mouth 2 (two) times daily.    [provider]  spironolactone (ALDACTONE) 50 MG tablet Take 50 mg by mouth daily.     [provider]  triamcinolone ointment (KENALOG) 0.5 % Apply topically. 09/21/17 09/21/18  [provider]    Allergies Metformin; Amoxicillin-pot clavulanate; Codeine; Erythromycin; Erythromycin ethylsuccinate; Levofloxacin; Metformin and related; Morphine; Morphine and related; Morphine sulfate er beads; Omeprazole;  Spiriva handihaler [tiotropium bromide monohydrate]; Statins; Tetracyclines & related; and Ace inhibitors  Family History  Problem Relation Age of Onset  . Alzheimer's disease Mother   . COPD Mother   . Cancer Mother   . COPD Father   . Diabetes Mellitus II Father   . Heart attack Father     Social History Social History   Tobacco Use  . Smoking  status: Former Smoker    Years: 25.00    Types: Cigarettes  . Smokeless tobacco: Never Used  Substance Use Topics  . Alcohol use: No    Alcohol/week: 0.0 oz  . Drug use: No    Review of Systems Constitutional: No fever/chills Eyes: No visual changes. ENT: No sore throat. Cardiovascular: Denies chest pain. Respiratory: Positive for shortness of breath. Gastrointestinal: Positive for abdominal distension  No nausea, no vomiting.  No diarrhea.  No constipation. Genitourinary: Negative for dysuria. Musculoskeletal: Negative for neck pain.  Negative for back pain. Integumentary:Negative for rash Neurological: Negative for headaches, focal weakness or numbness.   ____________________________________________   PHYSICAL EXAM:  VITAL SIGNS: ED Triage Vitals  Enc Vitals Group     BP 12/30/17 0619 (!) 165/76     Pulse Rate 12/30/17 0619 (!) 117     Resp 12/30/17 0619 (!) 29     Temp 12/30/17 0619 98.1 F (36.7 C)     Temp Source 12/30/17 0619 Oral     SpO2 12/30/17 0619 94 %     Weight 12/30/17 0620 56.2 kg (124 lb)     Height 12/30/17 0620 1.651 m (5\' 5" )     Head Circumference --      Peak Flow --      Pain Score 12/30/17 0620 0     Pain Loc --      Pain Edu? --      Excl. in GC? --     Constitutional: Alert and oriented. Well appearing and in no acute distress. Eyes: Conjunctivae are normal.  Head: Atraumatic. Mouth/Throat: Mucous membranes are moist.  Oropharynx non-erythematous. Neck: No stridor.   Cardiovascular: Normal rate, regular rhythm. Good peripheral circulation. Grossly normal heart sounds. Respiratory: Kidney, positive accessory respiratory muscle use, diffuse rhonchi with expiratory wheezing. Gastrointestinal: Marked abdominal distention with ascites fluid wave Musculoskeletal: No lower extremity tenderness nor edema. No gross deformities of extremities. Neurologic:  Normal speech and language. No gross focal neurologic deficits are appreciated.  Skin:   Skin is warm, dry and intact. No rash noted. Psychiatric: Mood and affect are normal. Speech and behavior are normal.  ____________________________________________   LABS (all labs ordered are listed, but only abnormal results are displayed)  Labs Reviewed  CBC - Abnormal; Notable for the following components:      Result Value   RBC 3.69 (*)    Hemoglobin 10.0 (*)    HCT 30.4 (*)    RDW 15.6 (*)    Platelets 87 (*)    All other components within normal limits  COMPREHENSIVE METABOLIC PANEL - Abnormal; Notable for the following components:   Chloride 94 (*)    Glucose, Bld 311 (*)    BUN 30 (*)    Creatinine, Ser 1.73 (*)    Total Protein 6.1 (*)    Albumin 3.1 (*)    Total Bilirubin 1.3 (*)    GFR calc non Af Amer 29 (*)    GFR calc Af Amer 33 (*)    All other components within normal limits  PROTIME-INR - Abnormal; Notable  for the following components:   Prothrombin Time 16.5 (*)    All other components within normal limits  BLOOD GAS, VENOUS  TYPE AND SCREEN   ____________________________________________  EKG ED ECG REPORT I, [place] N Erandi Lemma, the attending physician, personally viewed and interpreted this ECG.   Date: 12/30/2017  EKG Time: 6:23 AM  Rate: 116  Rhythm: Sinus tachycardia  Axis: Normal  Intervals: Normal  ST&T Change: None  ____________________________________________  RADIOLOGY I, [place] Dewayne Shorter, personally viewed and evaluated these images (plain radiographs) as part of my medical decision making, as well as reviewing the written report by the radiologist.  ED MD interpretation:    Official radiology report(s): Dg Chest Portable 1 View  Result Date: 12/30/2017 CLINICAL DATA:  Acute onset of shortness of breath and dyspnea. EXAM: PORTABLE CHEST 1 VIEW COMPARISON:  Chest radiograph performed 11/07/2017 FINDINGS: The lungs are well-aerated. Mild left basilar airspace opacity is compatible with pneumonia. Underlying emphysema is noted. There  is no evidence of pleural effusion or pneumothorax. The cardiomediastinal silhouette is within normal limits. No acute osseous abnormalities are seen. IMPRESSION: Mild left basilar pneumonia. Underlying emphysema noted. Electronically Signed   By: Roanna Raider M.D.   On: 12/30/2017 07:10      .Critical Care Performed by: Darci Current, MD Authorized by: Darci Current, MD   Critical care provider statement:    Critical care time (minutes):  30   Critical care time was exclusive of:  Separately billable procedures and treating other patients   Critical care was necessary to treat or prevent imminent or life-threatening deterioration of the following conditions:  Respiratory failure   Critical care was time spent personally by me on the following activities:  Development of treatment plan with patient or surrogate, discussions with consultants, evaluation of patient's response to treatment, examination of patient, obtaining history from patient or surrogate, ordering and performing treatments and interventions, ordering and review of laboratory studies, ordering and review of radiographic studies, pulse oximetry, re-evaluation of patient's condition and review of old charts   I assumed direction of critical care for this patient from another provider in my specialty: no       ____________________________________________   INITIAL IMPRESSION / ASSESSMENT AND PLAN / ED COURSE  As part of my medical decision making, I reviewed the following data within the electronic MEDICAL RECORD NUMBER   72 year old female presented with above-stated history and physical exam secondary to difficulty breathing, dark stools and increasing abdominal distention secondary to ascites.  Patient received multiple albuterol duo nebs as well as ceftriaxone and azithromycin given finding of possible left lower lobe pneumonia on x-ray however I also suspect that the patient's respiratory status is compromised  secondary to increased abdominal distention and pressure hence decreasing total lung capacity in a patient with known COPD.  Patient's hemoglobin baseline in comparison to previous from April at 10.  Patient discussed with Dr. Nemiah Commander for hospital admission for further evaluation and management of GI bleed pneumonia and ascites     ____________________________________________  FINAL CLINICAL IMPRESSION(S) / ED DIAGNOSES  Final diagnoses:  Community acquired pneumonia of left lower lobe of lung (HCC)  Other ascites  Lower GI bleed     MEDICATIONS GIVEN DURING THIS VISIT:  Medications  methylPREDNISolone sodium succinate (SOLU-MEDROL) 125 mg/2 mL injection 125 mg (125 mg Intravenous Given 12/30/17 0630)  albuterol (PROVENTIL) (2.5 MG/3ML) 0.083% nebulizer solution 7.5 mg (7.5 mg Nebulization Given 12/30/17 0645)     ED Discharge Orders  None       Note:  This document was prepared using Dragon voice recognition software and may include unintentional dictation errors.    Darci Current, MD 12/30/17 [phone]

## 2017-12-31 ENCOUNTER — Inpatient Hospital Stay: Payer: Medicare Other | Admitting: Anesthesiology

## 2017-12-31 ENCOUNTER — Encounter: Payer: Self-pay | Admitting: *Deleted

## 2017-12-31 ENCOUNTER — Encounter
Admission: EM | Disposition: A | Discharge status: Home Health | Payer: Self-pay | Source: Home / Self Care | Attending: Internal Medicine

## 2017-12-31 DIAGNOSIS — I8510 Secondary esophageal varices without bleeding: Secondary | ICD-10-CM

## 2017-12-31 HISTORY — PX: ESOPHAGOGASTRODUODENOSCOPY: SHX5428

## 2017-12-31 LAB — CBC
HEMATOCRIT: 21.3 % — AB (ref 35.0–47.0)
HEMOGLOBIN: 7.2 g/dL — AB (ref 12.0–16.0)
MCH: 27.6 pg (ref 26.0–34.0)
MCHC: 33.8 g/dL (ref 32.0–36.0)
MCV: 81.7 fL (ref 80.0–100.0)
Platelets: 45 10*3/uL — ABNORMAL LOW (ref 150–440)
RBC: 2.61 MIL/uL — AB (ref 3.80–5.20)
RDW: 15.3 % — ABNORMAL HIGH (ref 11.5–14.5)
WBC: 3.2 10*3/uL — ABNORMAL LOW (ref 3.6–11.0)

## 2017-12-31 LAB — BASIC METABOLIC PANEL
ANION GAP: 6 (ref 5–15)
BUN: 41 mg/dL — ABNORMAL HIGH (ref 6–20)
CALCIUM: 8.3 mg/dL — AB (ref 8.9–10.3)
CHLORIDE: 99 mmol/L — AB (ref 101–111)
CO2: 31 mmol/L (ref 22–32)
Creatinine, Ser: 1.75 mg/dL — ABNORMAL HIGH (ref 0.44–1.00)
GFR calc Af Amer: 33 mL/min — ABNORMAL LOW (ref >60)
GFR calc non Af Amer: 28 mL/min — ABNORMAL LOW (ref >60)
GLUCOSE: 312 mg/dL — AB (ref 65–99)
Potassium: 4.6 mmol/L (ref 3.5–5.1)
Sodium: 136 mmol/L (ref 135–145)

## 2017-12-31 LAB — IRON AND TIBC
IRON: 73 ug/dL (ref 28–170)
SATURATION RATIOS: 48 % — AB (ref 10.4–31.8)
TIBC: 153 ug/dL — AB (ref 250–450)
UIBC: 80 ug/dL

## 2017-12-31 LAB — FERRITIN: Ferritin: 322 ng/mL — ABNORMAL HIGH (ref 11–307)

## 2017-12-31 LAB — VITAMIN B12: Vitamin B-12: 301 pg/mL (ref 180–914)

## 2017-12-31 LAB — FOLATE: Folate: 6.4 ng/mL (ref >5.9)

## 2017-12-31 SURGERY — EGD (ESOPHAGOGASTRODUODENOSCOPY)
Anesthesia: General

## 2017-12-31 MED ORDER — PROPOFOL 10 MG/ML IV BOLUS
INTRAVENOUS | Status: DC | PRN
  Administered 2017-12-31: 10 mg via INTRAVENOUS
  Administered 2017-12-31: 30 mg via INTRAVENOUS
  Administered 2017-12-31 (×2): 10 mg via INTRAVENOUS

## 2017-12-31 MED ORDER — PROPOFOL 500 MG/50ML IV EMUL
INTRAVENOUS | Status: DC | PRN
  Administered 2017-12-31: 50 ug/kg/min via INTRAVENOUS

## 2017-12-31 MED ORDER — ENSURE ENLIVE PO LIQD
237.0000 mL | Freq: Three times a day (TID) | ORAL | Status: DC
  Administered 2017-12-31 (×2): 237 mL via ORAL

## 2017-12-31 MED ORDER — PROPOFOL 10 MG/ML IV BOLUS
INTRAVENOUS | Status: AC
  Filled 2017-12-31: qty 20

## 2017-12-31 MED ORDER — FUROSEMIDE 40 MG PO TABS
40.0000 mg | ORAL_TABLET | Freq: Every day | ORAL | Status: DC
  Administered 2018-01-01 – 2018-01-02 (×2): 40 mg via ORAL
  Filled 2017-12-31 (×2): qty 1

## 2017-12-31 MED ORDER — IPRATROPIUM-ALBUTEROL 0.5-2.5 (3) MG/3ML IN SOLN
3.0000 mL | Freq: Once | RESPIRATORY_TRACT | Status: AC
  Administered 2017-12-31: 3 mL via RESPIRATORY_TRACT

## 2017-12-31 MED ORDER — IPRATROPIUM-ALBUTEROL 0.5-2.5 (3) MG/3ML IN SOLN
RESPIRATORY_TRACT | Status: AC
  Filled 2017-12-31: qty 3

## 2017-12-31 MED ORDER — SODIUM CHLORIDE 0.9 % IV SOLN
INTRAVENOUS | Status: DC
  Administered 2017-12-31: 10:00:00 via INTRAVENOUS

## 2017-12-31 MED ORDER — LIDOCAINE HCL (PF) 2 % IJ SOLN
INTRAMUSCULAR | Status: AC
  Filled 2017-12-31: qty 10

## 2017-12-31 MED ORDER — PANTOPRAZOLE SODIUM 40 MG PO TBEC
40.0000 mg | DELAYED_RELEASE_TABLET | Freq: Two times a day (BID) | ORAL | Status: DC
  Administered 2017-12-31 – 2018-01-02 (×4): 40 mg via ORAL
  Filled 2017-12-31 (×5): qty 1

## 2017-12-31 MED ORDER — LIDOCAINE HCL (CARDIAC) PF 100 MG/5ML IV SOSY
PREFILLED_SYRINGE | INTRAVENOUS | Status: DC | PRN
  Administered 2017-12-31: 50 mg via INTRAVENOUS

## 2017-12-31 NOTE — Anesthesia Postprocedure Evaluation (Signed)
Anesthesia Post Note  Patient: Angela Austin  Procedure(s) Performed: ESOPHAGOGASTRODUODENOSCOPY (EGD) (N/A )  Patient location during evaluation: PACU Anesthesia Type: General Level of consciousness: awake and alert Pain management: pain level controlled Vital Signs Assessment: post-procedure vital signs reviewed and stable Respiratory status: spontaneous breathing, nonlabored ventilation, respiratory function stable and patient connected to nasal cannula oxygen Cardiovascular status: blood pressure returned to baseline and stable Postop Assessment: no apparent nausea or vomiting Anesthetic complications: no     Last Vitals:  Vitals:   12/31/17 1039 12/31/17 1054  BP:  (!) 127/55  Pulse: 97 98  Resp: (!) 26 17  Temp:  36.7 C  SpO2: 98% 94%    Last Pain:  Vitals:   12/31/17 1054  TempSrc: Oral  PainSc:                  Cleda MccreedyJoseph K Dezarae Mcclaran

## 2017-12-31 NOTE — Anesthesia Post-op Follow-up Note (Signed)
Anesthesia QCDR form completed.        

## 2017-12-31 NOTE — Anesthesia Procedure Notes (Signed)
Performed by: Marlaina Coburn, CRNA Pre-anesthesia Checklist: Patient identified, Emergency Drugs available, Suction available, Patient being monitored and Timeout performed Patient Re-evaluated:Patient Re-evaluated prior to induction Oxygen Delivery Method: Nasal cannula Induction Type: IV induction       

## 2017-12-31 NOTE — Op Note (Signed)
[facility] Patient Name: Angela Austin Procedure Date: 12/31/2017 9:31 AM MRN: [id number] Account #: [id number] Date of Birth: 05/30/1946 Admit Type: Inpatient Age: 72 Room: [facility] ENDO ROOM 4 Gender: Female Note Status: Finalized Procedure:            Upper GI endoscopy Indications:          Acute post hemorrhagic anemia, Melena, Cirrhosis with                        UGI bleeding suspected esophageal varices Providers:            Lin Landsman MD, MD Referring MD:         Caprice Renshaw MD (Referring MD) Medicines:            Monitored Anesthesia Care Complications:        No immediate complications. Estimated blood loss: None. Procedure:            Pre-Anesthesia Assessment:                       - Prior to the procedure, a History and Physical was                        performed, and patient medications and allergies were                        reviewed. The patient is competent. The risks and                        benefits of the procedure and the sedation options and                        risks were discussed with the patient. All questions                        were answered and informed consent was obtained.                        Patient identification and proposed procedure were                        verified by the physician, the nurse, the                        anesthesiologist, the anesthetist and the technician in                        the pre-procedure area in the procedure room in the                        endoscopy suite. Mental Status Examination: alert and                        oriented. Airway Examination: normal oropharyngeal                        airway and neck mobility. Respiratory Examination:                        clear to auscultation. CV Examination: normal.  Prophylactic Antibiotics: The patient does not require                        prophylactic antibiotics. Prior  Anticoagulants: The                        patient has taken no previous anticoagulant or                        antiplatelet agents. ASA Grade Assessment: IV - A                        patient with severe systemic disease that is a constant                        threat to life. After reviewing the risks and benefits,                        the patient was deemed in satisfactory condition to                        undergo the procedure. The anesthesia plan was to use                        monitored anesthesia care (MAC). Immediately prior to                        administration of medications, the patient was                        re-assessed for adequacy to receive sedatives. The                        heart rate, respiratory rate, oxygen saturations, blood                        pressure, adequacy of pulmonary ventilation, and                        response to care were monitored throughout the                        procedure. The physical status of the patient was                        re-assessed after the procedure.                       After obtaining informed consent, the endoscope was                        passed under direct vision. Throughout the procedure,                        the patient's blood pressure, pulse, and oxygen                        saturations were monitored continuously. The Endoscope  was introduced through the mouth, and advanced to the                        second part of duodenum. The upper GI endoscopy was                        accomplished without difficulty. The patient tolerated                        the procedure well. Findings:      The duodenal bulb and second portion of the duodenum were normal.      Severe portal hypertensive gastropathy was found in the entire examined       stomach.      Esophagogastric landmarks were identified: the gastroesophageal junction       was found at 38 cm from the incisors.       Four columns of non-bleeding large (> 5 mm) varices were found in the       lower third of the esophagus,. Stigmata of recent bleeding were evident       and red wale signs were present. Four bands were successfully placed       with incomplete eradication of varices. There was no bleeding during,       and at the end, of the procedure. Impression:           - Normal duodenal bulb and second portion of the                        duodenum.                       - Portal hypertensive gastropathy.                       - Esophagogastric landmarks identified.                       - Recently bleeding large (> 5 mm) esophageal varices.                        Incompletely eradicated. Banded.                       - No specimens collected. Recommendation:       - Return patient to hospital ward for ongoing care.                       - Full liquid diet today.                       - Continue present medications.                       - Repeat upper endoscopy in 4 weeks for endoscopic band                        ligation.                       - Start propranolol 16m BID or nadolol 429mdaily for  secondary variceal prophylaxis                       - F/u in GI clinic in 1-2 weeks after discharge Procedure Code(s):    --- Professional ---                       [phone], Esophagogastroduodenoscopy, flexible, transoral;                        with band ligation of esophageal/gastric varices Diagnosis Code(s):    --- Professional ---                       K74.60, Unspecified cirrhosis of liver                       I85.11, Secondary esophageal varices with bleeding                       K76.6, Portal hypertension                       K31.89, Other diseases of stomach and duodenum                       D62, Acute posthemorrhagic anemia                       K92.1, Melena (includes Hematochezia) CPT copyright 2017 American Medical Association. All rights reserved. The codes  documented in this report are preliminary and upon coder review may  be revised to meet current compliance requirements. Dr. Ulyess Mort Lin Landsman MD, MD 12/31/2017 10:05:22 AM This report has been signed electronically. Number of Addenda: 0 Note Initiated On: 12/31/2017 9:31 AM      [facility]

## 2017-12-31 NOTE — Transfer of Care (Signed)
Immediate Anesthesia Transfer of Care Note  Patient: Angela Austin  Procedure(s) Performed: ESOPHAGOGASTRODUODENOSCOPY (EGD) (N/A )  Patient Location: PACU  Anesthesia Type:General  Level of Consciousness: sedated and responds to stimulation  Airway & Oxygen Therapy: Patient Spontanous Breathing and Patient connected to nasal cannula oxygen  Post-op Assessment: Report given to RN and Post -op Vital signs reviewed and stable  Post vital signs: Reviewed and stable  Last Vitals:  Vitals Value Taken Time  BP 117/44 12/31/2017 10:06 AM  Temp    Pulse 93 12/31/2017 10:06 AM  Resp 25 12/31/2017 10:06 AM  SpO2 100 % 12/31/2017 10:06 AM  Vitals shown include unvalidated device data.  Last Pain:  Vitals:   12/31/17 0720  TempSrc:   PainSc: 0-No pain      Patients Stated Pain Goal: 0 (12/30/17 2015)  Complications: No apparent anesthesia complications

## 2017-12-31 NOTE — Anesthesia Preprocedure Evaluation (Addendum)
Anesthesia Evaluation  Patient identified by MRN, date of birth, ID band Patient awake    Reviewed: Allergy & Precautions, H&P , NPO status , Patient's Chart, lab work & pertinent test results  History of Anesthesia Complications Negative for: history of anesthetic complications  Airway Mallampati: III  TM Distance: >3 FB Neck ROM: limited    Dental  (+) Chipped, Partial Lower, Upper Dentures, Poor Dentition, Missing   Pulmonary neg shortness of breath, COPD,  COPD inhaler and oxygen dependent, former smoker,           Cardiovascular Exercise Tolerance: Good hypertension, (-) angina(-) Past MI and (-) DOE      Neuro/Psych PSYCHIATRIC DISORDERS Depression negative neurological ROS     GI/Hepatic Neg liver ROS, GERD  Medicated and Controlled,  Endo/Other  diabetes, Type 2Hypothyroidism   Renal/GU ESRFRenal disease  negative genitourinary   Musculoskeletal   Abdominal   Peds  Hematology negative hematology ROS (+)   Anesthesia Other Findings Patient endorses baseline vocal hoarseness   Past Medical History: No date: Cirrhosis of liver not due to alcohol (HCC) No date: COPD (chronic obstructive pulmonary disease) (HCC) No date: Depression No date: Diabetes (HCC) No date: GERD (gastroesophageal reflux disease) No date: Hypothyroidism No date: ITP (idiopathic thrombocytopenic purpura) No date: Nephrolithiasis  Past Surgical History: No date: CATARACT EXTRACTION No date: LUMBAR DISC SURGERY No date: TONSILLECTOMY AND ADENOIDECTOMY  BMI    Body Mass Index:  20.63 kg/m      Reproductive/Obstetrics negative OB ROS                            Anesthesia Physical Anesthesia Plan  ASA: IV  Anesthesia Plan: General   Post-op Pain Management:    Induction: Intravenous  PONV Risk Score and Plan: Propofol infusion and TIVA  Airway Management Planned: Natural Airway and Nasal  Cannula  Additional Equipment:   Intra-op Plan:   Post-operative Plan:   Informed Consent: I have reviewed the patients History and Physical, chart, labs and discussed the procedure including the risks, benefits and alternatives for the proposed anesthesia with the patient or authorized representative who has indicated his/her understanding and acceptance.   Dental Advisory Given  Plan Discussed with: Anesthesiologist, CRNA and Surgeon  Anesthesia Plan Comments: (Patient consented for risks of anesthesia including but not limited to:  - adverse reactions to medications - risk of intubation if required - damage to teeth, lips or other oral mucosa - sore throat or hoarseness - Damage to heart, brain, lungs or loss of life  Patient voiced understanding.)        Anesthesia Quick Evaluation

## 2017-12-31 NOTE — Progress Notes (Signed)
[facility] [place] [facility]   PATIENT NAME: Angela Austin    MR#:  [id number]  DATE OF BIRTH:  [birth year]  SUBJECTIVE:  CHIEF COMPLAINT:   Chief Complaint  Patient presents with  . Rectal Bleeding  . Shortness of Breath   Came with worsening ascites fluid, had ascites tap and 4.7 ltr fluid removed, feels better. Also had EGD and banding of Verices done 12/31/17.  REVIEW OF SYSTEMS:  CONSTITUTIONAL: No fever, have fatigue or weakness.  EYES: No blurred or double vision.  EARS, NOSE, AND THROAT: No tinnitus or ear pain.  RESPIRATORY: No cough, shortness of breath, wheezing or hemoptysis.  CARDIOVASCULAR: No chest pain, orthopnea, edema.  GASTROINTESTINAL: No nausea, vomiting, diarrhea or abdominal pain.  GENITOURINARY: No dysuria, hematuria.  ENDOCRINE: No polyuria, nocturia,  HEMATOLOGY: No anemia, easy bruising or bleeding SKIN: No rash or lesion. MUSCULOSKELETAL: No joint pain or arthritis.   NEUROLOGIC: No tingling, numbness, weakness.  PSYCHIATRY: No anxiety or depression.   ROS  DRUG ALLERGIES:   Allergies  Allergen Reactions  . Metformin Diarrhea  . Amoxicillin-Pot Clavulanate Other (See Comments)    Other reaction(s): Unknown  . Codeine   . Erythromycin   . Erythromycin Ethylsuccinate     Other reaction(s): Unknown  . Levofloxacin Other (See Comments)    Jittery, nausea  . Metformin And Related     GI bleed  . Morphine Other (See Comments) and Nausea And Vomiting    caused severe cramping   . Morphine And Related   . Morphine Sulfate Er Beads Other (See Comments)    caused severe cramping   . Omeprazole Other (See Comments)    GI side effects GI side effects  . Spiriva Handihaler [Tiotropium Bromide Monohydrate] Swelling    Tongue  . Statins Other (See Comments)    Other reaction(s): Muscle Pain  . Tetracyclines & Related   . Ace Inhibitors Rash    VITALS:  Blood pressure (!) 162/65, pulse (!) 103, temperature 98.1 F  (36.7 C), temperature source Oral, resp. rate 20, height 5\' 5"  (1.651 m), weight 56.2 kg (124 lb), SpO2 95 %.  PHYSICAL EXAMINATION:  GENERAL:  72 y.o.-year-old patient lying in the bed with no acute distress.  EYES: Pupils equal, round, reactive to light and accommodation. No scleral icterus. Extraocular muscles intact.  HEENT: Head atraumatic, normocephalic. Oropharynx and nasopharynx clear.  NECK:  Supple, no jugular venous distention. No thyroid enlargement, no tenderness.  LUNGS: Normal breath sounds bilaterally, no wheezing, rales,rhonchi or crepitation. No use of accessory muscles of respiration.  CARDIOVASCULAR: S1, S2 normal. No murmurs, rubs, or gallops.  ABDOMEN: Soft, mild tender, distended. Bowel sounds present. No organomegaly or mass.  EXTREMITIES: No pedal edema, cyanosis, or clubbing.  NEUROLOGIC: Cranial nerves II through XII are intact. Muscle strength 5/5 in all extremities. Sensation intact. Gait not checked.  PSYCHIATRIC: The patient is alert and oriented x 3.  SKIN: No obvious rash, lesion, or ulcer.   Physical Exam LABORATORY PANEL:   CBC Recent Labs  Lab 12/31/17 0723  WBC 3.2*  HGB 7.2*  HCT 21.3*  PLT 45*   ------------------------------------------------------------------------------------------------------------------  Chemistries  Recent Labs  Lab 12/30/17 0626 12/31/17 0723  NA 136 136  K 3.7 4.6  CL 94* 99*  CO2 31 31  GLUCOSE 311* 312*  BUN 30* 41*  CREATININE 1.73* 1.75*  CALCIUM 9.0 8.3*  AST 32  --   ALT 17  --   ALKPHOS 86  --  BILITOT 1.3*  --    ------------------------------------------------------------------------------------------------------------------  Cardiac Enzymes No results for input(s): TROPONINI in the last 168 hours. ------------------------------------------------------------------------------------------------------------------  RADIOLOGY:  [place] Paracentesis  Result Date: 12/30/2017 INDICATION: NASH  cirrhosis. Abdominal distension. Ascites. Request for paracentesis EXAM: ULTRASOUND GUIDED RIGHT LOWER QUADRANT PARACENTESIS MEDICATIONS: None. COMPLICATIONS: None immediate. PROCEDURE: Informed written consent was obtained from the patient after a discussion of the risks, benefits and alternatives to treatment. A timeout was performed prior to the initiation of the procedure. Initial ultrasound scanning demonstrates a large amount of ascites within the right lower abdominal quadrant. The right lower abdomen was prepped and draped in the usual sterile fashion. 1% lidocaine with epinephrine was used for local anesthesia. Following this, a 6 Fr Safe-T-Centesis catheter was introduced. An ultrasound image was saved for documentation purposes. The paracentesis was performed. The catheter was removed and a dressing was applied. The patient tolerated the procedure well without immediate post procedural complication. FINDINGS: A total of approximately 7.4 L of clear, amber fluid was removed. IMPRESSION: Successful ultrasound-guided paracentesis yielding 7.4 liters of peritoneal fluid. Read by Brayton ElKevin Bruning PA-C Electronically Signed   By: Simonne ComeJohn  Watts M.D.   On: 12/30/2017 15:21   Dg Chest Portable 1 View  Result Date: 12/30/2017 CLINICAL DATA:  Acute onset of shortness of breath and dyspnea. EXAM: PORTABLE CHEST 1 VIEW COMPARISON:  Chest radiograph performed 11/07/2017 FINDINGS: The lungs are well-aerated. Mild left basilar airspace opacity is compatible with pneumonia. Underlying emphysema is noted. There is no evidence of pleural effusion or pneumothorax. The cardiomediastinal silhouette is within normal limits. No acute osseous abnormalities are seen. IMPRESSION: Mild left basilar pneumonia. Underlying emphysema noted. Electronically Signed   By: Roanna RaiderJeffery  Chang M.D.   On: 12/30/2017 07:10   [place] Ascites (abdomen Limited)  Result Date: 12/30/2017 CLINICAL DATA:  Evaluate for ascites. EXAM: LIMITED ABDOMEN ULTRASOUND  FOR ASCITES TECHNIQUE: Limited ultrasound survey for ascites was performed in all four abdominal quadrants. COMPARISON:  None. FINDINGS: Moderate amount of ascites is noted in the abdomen, most evident in the lower quadrants. Liver appears smaller and nodular with a coarsened echotexture consistent with cirrhosis. IMPRESSION: Moderate ascites. Electronically Signed   By: Amie Portlandavid  Ormond M.D.   On: 12/30/2017 09:27    ASSESSMENT AND PLAN:   Principal Problem:   Ascites Active Problems:   Melena  * Ascites- Non alcoholic liver cirrhosis   appreciated IR help- 4/7 ltr fluid removed, no infection as per results..   Cont lasix.   GI consult appreciated, may need to adjust diuretics dose as will add propranolol on d/c.  * Melena- upper gi bleed, ac blood loss anemia    Due to liver cirrhosis , may have verices, denies vomiting.   Protonix. Changed to oral.   GI consult appreciated, EGD on 12/31/17- verices banded.   Advised octreotide drip for 72 hrs then stat on oral propranolol.   Monitor Hb- 7.2 now.  * Pneumonia   Questionable on Xray   Rocephin and azithromycin  * COPD exacerbation   Cont IV steroids, nebs and Abx  * Ch respi failure    On home Oxygen  * Thrombocytopenia   Follows with Dr. Orlie DakinFinnegan and had received plt transfusions in past.   Plt 45 now, will hold anticoagulants now.   Monitor.   May need plt, If bleed now.  * CKD st 3, stable.  * Hyperglycemia   Check HBA1c  All the records are reviewed and case discussed with Care Management/Social Workerr. Management  plans discussed with the patient, family and they are in agreement.  CODE STATUS: full.  TOTAL TIME TAKING CARE OF THIS PATIENT: 35 minutes.     POSSIBLE D/C IN 1-2 DAYS, DEPENDING ON CLINICAL CONDITION.   Altamese Dilling M.D on 12/31/2017   Between 7am to 6pm - Pager - [phone]  After 6pm go to [email or web address] - password [email or web address]  [facility]  Office   [phone]  CC: Primary care physician; Kandyce Rud, MD  Note: This dictation was prepared with Dragon dictation along with smaller phrase technology. Any transcriptional errors that result from this process are unintentional.

## 2017-12-31 NOTE — Progress Notes (Signed)
EGD post procedure note  Findings: - Normal duodenal bulb and second portion of the duodenum. - Portal hypertensive gastropathy. - Recently bleeding large (> 5 mm) esophageal varices. Incompletely eradicated. Bandedx4. - No specimens collected.  Impression: - Return patient to hospital ward for ongoing care. - Full liquid diet today. - advance diet tomorrow - Continue octreotide drip for 72 hours total - monitor CBC closely - Discontinue Protonix drip, start Protonix 40 mg twice a day - Start propranolol 20mg  BID or nadolol 40mg  daily for secondary variceal prophylaxis after discontinuation of octreotide drip - Repeat upper endoscopy in 4 weeks as outpatient for endoscopic band ligation. - nutrition consult for education on low sodium, high-protein diet with decompensated cirrhosis - Titrate dose of diuretics, currently on Lasix 40 twice daily and spironolactone 100 mg daily - F/u in GI clinic in 1-2 weeks after discharge  Dr. Tobi BastosAnna to cover from tomorrow  Arlyss Repressohini R Vanga, MD [address]  [address]  [place], [place] [address]  Main: [phone]  Fax: [phone] Pager: [phone]

## 2017-12-31 NOTE — Progress Notes (Signed)
Family Meeting Note  Advance Directive:yes  Today a meeting took place with the Patient.  The following clinical team members were present during this meeting:MD  The following were discussed:Patient's diagnosis: ascites, liver cirrhosis, thrombocytopenia, Patient's progosis: Unable to determine and Goals for treatment: Full Code  Additional follow-up to be provided: GI, radiology  Time spent during discussion:20 minutes  Angela DillingVaibhavkumar Jevan Gaunt, MD

## 2017-12-31 NOTE — H&P (Signed)
[employer] - [place] at [facility]   PATIENT NAME: Angela Austin    MR#:  [id number]  DATE OF BIRTH:  July 23, 1946  DATE OF ADMISSION:  12/30/2017  PRIMARY CARE PHYSICIAN: Kandyce Rud, MD   REQUESTING/REFERRING PHYSICIAN: Manson Passey  CHIEF COMPLAINT:   Chief Complaint  Patient presents with  . Rectal Bleeding  . Shortness of Breath    HISTORY OF PRESENT ILLNESS: Angela Austin  is a 72 y.o. female with a known history of COPD on chronic oxygen, Liver cirrhosis and thrombocytopenia- has worsening abd ascites for last few months. She also had some URI symptoms and received some Abx last month. Last few days- worsening cough and SOB and abd swelling with discomfort. She also had one episode of black stool yesterday- so came to ER. Noted stable Hb, but distended abdomen. Denies vomiting, fever.  PAST MEDICAL HISTORY:   Past Medical History:  Diagnosis Date  . Cirrhosis of liver not due to alcohol (HCC)   . COPD (chronic obstructive pulmonary disease) (HCC)   . Depression   . Diabetes (HCC)   . GERD (gastroesophageal reflux disease)   . Hypothyroidism   . ITP (idiopathic thrombocytopenic purpura)   . Nephrolithiasis     PAST SURGICAL HISTORY:  Past Surgical History:  Procedure Laterality Date  . CATARACT EXTRACTION    . LUMBAR DISC SURGERY    . TONSILLECTOMY AND ADENOIDECTOMY      SOCIAL HISTORY:  Social History   Tobacco Use  . Smoking status: Former Smoker    Years: 25.00    Types: Cigarettes  . Smokeless tobacco: Never Used  Substance Use Topics  . Alcohol use: No    Alcohol/week: 0.0 oz    FAMILY HISTORY:  Family History  Problem Relation Age of Onset  . Alzheimer's disease Mother   . COPD Mother   . Cancer Mother   . COPD Father   . Diabetes Mellitus II Father   . Heart attack Father     DRUG ALLERGIES:  Allergies  Allergen Reactions  . Metformin Diarrhea  . Amoxicillin-Pot Clavulanate Other (See Comments)    Other  reaction(s): Unknown  . Codeine   . Erythromycin   . Erythromycin Ethylsuccinate     Other reaction(s): Unknown  . Levofloxacin Other (See Comments)    Jittery, nausea  . Metformin And Related     GI bleed  . Morphine Other (See Comments) and Nausea And Vomiting    caused severe cramping   . Morphine And Related   . Morphine Sulfate Er Beads Other (See Comments)    caused severe cramping   . Omeprazole Other (See Comments)    GI side effects GI side effects  . Spiriva Handihaler [Tiotropium Bromide Monohydrate] Swelling    Tongue  . Statins Other (See Comments)    Other reaction(s): Muscle Pain  . Tetracyclines & Related   . Ace Inhibitors Rash    REVIEW OF SYSTEMS:   CONSTITUTIONAL: No fever, fatigue or weakness.  EYES: No blurred or double vision.  EARS, NOSE, AND THROAT: No tinnitus or ear pain.  RESPIRATORY: have cough, shortness of breath, no wheezing or hemoptysis.  CARDIOVASCULAR: No chest pain, orthopnea, edema.  GASTROINTESTINAL: No nausea, vomiting, diarrhea or abdominal pain. Have distention and dark stool. GENITOURINARY: No dysuria, hematuria.  ENDOCRINE: No polyuria, nocturia,  HEMATOLOGY: No anemia, easy bruising or bleeding SKIN: No rash or lesion. MUSCULOSKELETAL: No joint pain or arthritis.   NEUROLOGIC: No tingling, numbness, weakness.  PSYCHIATRY: No  anxiety or depression.   MEDICATIONS AT HOME:  Prior to Admission medications   Medication Sig Start Date End Date Taking? Authorizing Provider  albuterol (ACCUNEB) 1.25 MG/3ML nebulizer solution Take 1 ampule by nebulization every 6 (six) hours as needed for wheezing.   Yes [provider]  albuterol (PROVENTIL HFA) 108 (90 Base) MCG/ACT inhaler Inhale 1 puff into the lungs every 4 (four) hours as needed.    Yes [provider]  FLUoxetine (PROZAC) 20 MG tablet Take 20 mg by mouth daily.   Yes [provider]  fluticasone (FLONASE) 50 MCG/ACT nasal spray PLACE 2 SPRAYS INTO  BOTH NOSTRILS ONCE DAILY AS NEEDED FOR ALLERGIES 11/27/15  Yes [provider]  furosemide (LASIX) 20 MG tablet Take 20 mg by mouth 2 (two) times daily.   Yes [provider]  levothyroxine (SYNTHROID, LEVOTHROID) 75 MCG tablet Take 75 mcg by mouth daily before breakfast.  01/07/16  Yes [provider]  loratadine (CLARITIN) 10 MG tablet Take 10 mg by mouth daily as needed for allergies.   Yes [provider]  pantoprazole (PROTONIX) 40 MG tablet Take 40 mg by mouth 2 (two) times daily.   Yes [provider]  spironolactone (ALDACTONE) 50 MG tablet Take 50 mg by mouth 2 (two) times daily.    Yes [provider]      PHYSICAL EXAMINATION:   VITAL SIGNS: Blood pressure (!) 85/68, pulse 89, temperature 98.4 F (36.9 C), temperature source Oral, resp. rate (!) 24, height 5\' 5"  (1.651 m), weight 56.2 kg (124 lb), SpO2 97 %.  GENERAL:  72 y.o.-year-old patient lying in the bed with no acute distress.  EYES: Pupils equal, round, reactive to light and accommodation. No scleral icterus. Extraocular muscles intact.  HEENT: Head atraumatic, normocephalic. Oropharynx and nasopharynx clear.  NECK:  Supple, no jugular venous distention. No thyroid enlargement, no tenderness.  LUNGS: Normal breath sounds bilaterally, no wheezing, rales, some crepitation. No use of accessory muscles of respiration.  CARDIOVASCULAR: S1, S2 normal. No murmurs, rubs, or gallops.  ABDOMEN: Soft, mild tender, distended. Bowel sounds present. No organomegaly or mass.  EXTREMITIES: No pedal edema, cyanosis, or clubbing.  NEUROLOGIC: Cranial nerves II through XII are intact. Muscle strength 5/5 in all extremities. Sensation intact. Gait not checked.  PSYCHIATRIC: The patient is alert and oriented x 3.  SKIN: No obvious rash, lesion, or ulcer.   LABORATORY PANEL:   CBC Recent Labs  Lab 12/30/17 0626  WBC 7.2  HGB 10.0*  HCT 30.4*  PLT 87*  MCV 82.4  MCH 27.0  MCHC 32.8   RDW 15.6*   ------------------------------------------------------------------------------------------------------------------  Chemistries  Recent Labs  Lab 12/30/17 0626  NA 136  K 3.7  CL 94*  CO2 31  GLUCOSE 311*  BUN 30*  CREATININE 1.73*  CALCIUM 9.0  AST 32  ALT 17  ALKPHOS 86  BILITOT 1.3*   ------------------------------------------------------------------------------------------------------------------ estimated creatinine clearance is 26.5 mL/min (A) (by C-G formula based on SCr of 1.73 mg/dL (H)). ------------------------------------------------------------------------------------------------------------------ No results for input(s): TSH, T4TOTAL, T3FREE, THYROIDAB in the last 72 hours.  Invalid input(s): FREET3   Coagulation profile Recent Labs  Lab 12/30/17 0626  INR 1.34   ------------------------------------------------------------------------------------------------------------------- No results for input(s): DDIMER in the last 72 hours. -------------------------------------------------------------------------------------------------------------------  Cardiac Enzymes No results for input(s): CKMB, TROPONINI, MYOGLOBIN in the last 168 hours.  Invalid input(s): CK ------------------------------------------------------------------------------------------------------------------ Invalid input(s): POCBNP  ---------------------------------------------------------------------------------------------------------------  Urinalysis No results found for: COLORURINE, APPEARANCEUR, LABSPEC, PHURINE, GLUCOSEU, HGBUR, BILIRUBINUR,  Vance GatherKETONESUR, PROTEINUR, UROBILINOGEN, NITRITE, LEUKOCYTESUR   RADIOLOGY: Koreas Paracentesis  Result Date: 12/30/2017 INDICATION: NASH cirrhosis. Abdominal distension. Ascites. Request for paracentesis EXAM: ULTRASOUND GUIDED RIGHT LOWER QUADRANT PARACENTESIS MEDICATIONS: None. COMPLICATIONS: None immediate. PROCEDURE: Informed written  consent was obtained from the patient after a discussion of the risks, benefits and alternatives to treatment. A timeout was performed prior to the initiation of the procedure. Initial ultrasound scanning demonstrates a large amount of ascites within the right lower abdominal quadrant. The right lower abdomen was prepped and draped in the usual sterile fashion. 1% lidocaine with epinephrine was used for local anesthesia. Following this, a 6 Fr Safe-T-Centesis catheter was introduced. An ultrasound image was saved for documentation purposes. The paracentesis was performed. The catheter was removed and a dressing was applied. The patient tolerated the procedure well without immediate post procedural complication. FINDINGS: A total of approximately 7.4 L of clear, amber fluid was removed. IMPRESSION: Successful ultrasound-guided paracentesis yielding 7.4 liters of peritoneal fluid. Read by Brayton ElKevin Bruning PA-C Electronically Signed   By: Simonne ComeJohn  Watts M.D.   On: 12/30/2017 15:21   Dg Chest Portable 1 View  Result Date: 12/30/2017 CLINICAL DATA:  Acute onset of shortness of breath and dyspnea. EXAM: PORTABLE CHEST 1 VIEW COMPARISON:  Chest radiograph performed 11/07/2017 FINDINGS: The lungs are well-aerated. Mild left basilar airspace opacity is compatible with pneumonia. Underlying emphysema is noted. There is no evidence of pleural effusion or pneumothorax. The cardiomediastinal silhouette is within normal limits. No acute osseous abnormalities are seen. IMPRESSION: Mild left basilar pneumonia. Underlying emphysema noted. Electronically Signed   By: Roanna RaiderJeffery  Chang M.D.   On: 12/30/2017 07:10   Koreas Ascites (abdomen Limited)  Result Date: 12/30/2017 CLINICAL DATA:  Evaluate for ascites. EXAM: LIMITED ABDOMEN ULTRASOUND FOR ASCITES TECHNIQUE: Limited ultrasound survey for ascites was performed in all four abdominal quadrants. COMPARISON:  None. FINDINGS: Moderate amount of ascites is noted in the abdomen, most evident  in the lower quadrants. Liver appears smaller and nodular with a coarsened echotexture consistent with cirrhosis. IMPRESSION: Moderate ascites. Electronically Signed   By: Amie Portlandavid  Ormond M.D.   On: 12/30/2017 09:27    EKG: Orders placed or performed during the hospital encounter of 12/30/17  . EKG 12-Lead  . EKG 12-Lead    IMPRESSION AND PLAN:  * Ascites- Non alcoholic liver cirrhosis   Spoke to radiologist for tap.   Cont lasix.   GI consult .  * Melena    Due to liver cirrhosis , may have verices, denies vomiting.   Protonix.   GI consult.  * Pneumonia   Questionable on Xray   Rocephin and azithromycin  * COPD exacerbation   Cont IV steroids, nebs and Abx  * Ch respi failure    On home Oxygen  * Thrombocytopenia   Follows with Dr. Orlie DakinFinnegan and had received plt transfusions in past.   Plt > 50 now, will hold anticoagulants now.   All the records are reviewed and case discussed with ED provider. Management plans discussed with the patient, family and they are in agreement.  CODE STATUS: Full.    Code Status Orders  (From admission, onward)        Start     Ordered   12/30/17 1015  Full code  Continuous     12/30/17 1015    Code Status History    This patient has a current code status but no historical code status.       TOTAL TIME TAKING CARE  OF THIS PATIENT: 50 minutes.    Altamese Dilling M.D on 12/31/2017   Between 7am to 6pm - Pager - [phone]  After 6pm go to [email or web address] - password [email or web address]  [facility]  Office  [phone]  CC: Primary care physician; Kandyce Rud, MD   Note: This dictation was prepared with Dragon dictation along with smaller phrase technology. Any transcriptional errors that result from this process are unintentional.

## 2018-01-01 ENCOUNTER — Ambulatory Visit: Payer: Medicare Other | Admitting: Gastroenterology

## 2018-01-01 ENCOUNTER — Encounter: Payer: Self-pay | Admitting: Gastroenterology

## 2018-01-01 LAB — PROTEIN, BODY FLUID (OTHER): Total Protein, Body Fluid Other: 0.8 g/dL

## 2018-01-01 LAB — CBC
HCT: 26.3 % — ABNORMAL LOW (ref 35.0–47.0)
Hemoglobin: 8.5 g/dL — ABNORMAL LOW (ref 12.0–16.0)
MCH: 27 pg (ref 26.0–34.0)
MCHC: 32.1 g/dL (ref 32.0–36.0)
MCV: 84 fL (ref 80.0–100.0)
PLATELETS: 58 10*3/uL — AB (ref 150–440)
RBC: 3.13 MIL/uL — AB (ref 3.80–5.20)
RDW: 15.9 % — ABNORMAL HIGH (ref 11.5–14.5)
WBC: 5.4 10*3/uL (ref 3.6–11.0)

## 2018-01-01 LAB — BASIC METABOLIC PANEL
Anion gap: 11 (ref 5–15)
BUN: 56 mg/dL — ABNORMAL HIGH (ref 6–20)
CHLORIDE: 95 mmol/L — AB (ref 101–111)
CO2: 26 mmol/L (ref 22–32)
Calcium: 8.4 mg/dL — ABNORMAL LOW (ref 8.9–10.3)
Creatinine, Ser: 1.89 mg/dL — ABNORMAL HIGH (ref 0.44–1.00)
GFR calc non Af Amer: 26 mL/min — ABNORMAL LOW (ref >60)
GFR, EST AFRICAN AMERICAN: 30 mL/min — AB (ref >60)
Glucose, Bld: 532 mg/dL (ref 65–99)
Potassium: 4.5 mmol/L (ref 3.5–5.1)
Sodium: 132 mmol/L — ABNORMAL LOW (ref 135–145)

## 2018-01-01 LAB — GLUCOSE, CAPILLARY
GLUCOSE-CAPILLARY: 366 mg/dL — AB (ref 65–99)
GLUCOSE-CAPILLARY: 241 mg/dL — AB (ref 65–99)
Glucose-Capillary: 504 mg/dL (ref 65–99)
Glucose-Capillary: 270 mg/dL — ABNORMAL HIGH (ref 65–99)

## 2018-01-01 LAB — HEMOGLOBIN A1C
Hgb A1c MFr Bld: 7.8 % — ABNORMAL HIGH (ref 4.8–5.6)
Mean Plasma Glucose: 177.16 mg/dL

## 2018-01-01 MED ORDER — PREDNISONE 50 MG PO TABS: 50.0000 mg | ORAL_TABLET | Freq: Every day | ORAL | Status: DC

## 2018-01-01 MED ORDER — INSULIN ASPART 100 UNIT/ML ~~LOC~~ SOLN
0.0000 [IU] | Freq: Every day | SUBCUTANEOUS | Status: DC
  Administered 2018-01-01: 2 [IU] via SUBCUTANEOUS
  Filled 2018-01-01: qty 1

## 2018-01-01 MED ORDER — INSULIN GLARGINE 100 UNIT/ML ~~LOC~~ SOLN
8.0000 [IU] | Freq: Every day | SUBCUTANEOUS | Status: DC
  Administered 2018-01-01 – 2018-01-02 (×2): 8 [IU] via SUBCUTANEOUS
  Filled 2018-01-01 (×3): qty 0.08

## 2018-01-01 MED ORDER — INSULIN ASPART 100 UNIT/ML ~~LOC~~ SOLN
8.0000 [IU] | Freq: Once | SUBCUTANEOUS | Status: AC
  Administered 2018-01-01: 8 [IU] via SUBCUTANEOUS
  Filled 2018-01-01: qty 1

## 2018-01-01 MED ORDER — INSULIN ASPART 100 UNIT/ML ~~LOC~~ SOLN
0.0000 [IU] | Freq: Three times a day (TID) | SUBCUTANEOUS | Status: DC
  Administered 2018-01-01: 8 [IU] via SUBCUTANEOUS
  Administered 2018-01-01: 15 [IU] via SUBCUTANEOUS
  Administered 2018-01-02: 5 [IU] via SUBCUTANEOUS
  Administered 2018-01-02: 3 [IU] via SUBCUTANEOUS
  Filled 2018-01-01 (×6): qty 1

## 2018-01-01 MED ORDER — LEVOFLOXACIN 250 MG PO TABS
250.0000 mg | ORAL_TABLET | Freq: Every day | ORAL | Status: DC
  Administered 2018-01-01 – 2018-01-02 (×2): 250 mg via ORAL
  Filled 2018-01-01 (×2): qty 1

## 2018-01-01 MED ORDER — ADULT MULTIVITAMIN W/MINERALS CH
1.0000 | ORAL_TABLET | Freq: Every day | ORAL | Status: DC
  Administered 2018-01-01 – 2018-01-02 (×2): 1 via ORAL
  Filled 2018-01-01 (×2): qty 1

## 2018-01-01 MED ORDER — PREMIER PROTEIN SHAKE
11.0000 [oz_av] | Freq: Two times a day (BID) | ORAL | Status: DC
  Administered 2018-01-01 – 2018-01-02 (×2): 11 [oz_av] via ORAL

## 2018-01-01 MED ORDER — PREDNISONE 20 MG PO TABS
30.0000 mg | ORAL_TABLET | Freq: Every day | ORAL | Status: DC
  Administered 2018-01-02: 30 mg via ORAL
  Filled 2018-01-01: qty 2

## 2018-01-01 NOTE — Progress Notes (Signed)
Called RN to see if the patient still needed IV access and RN stated at this time the patient had a working IV, and if she needed us, she would call us or put in another IV consult

## 2018-01-01 NOTE — Progress Notes (Signed)
Wyline MoodKiran Ezio Wieck , MD [address], [address], [place], [place], [address] [address], [place], [place], [address] Phone: [phone]  Fax: [phone]   Angela DowJackie L Austin is being followed for melena, decompensated liver cirrhosis   Subjective: Feels well , no bowel movement since yesterday    Objective: Vital signs in last 24 hours: Vitals:   12/31/17 2049 01/01/18 0442 01/01/18 0444 01/01/18 0554  BP: (!) 162/65 (!) 146/120 (!) 169/58   Pulse: (!) 103 (!) 109 (!) 109 (!) 107  Resp: 20 19    Temp: 98.1 F (36.7 C) 98.1 F (36.7 C)    TempSrc: Oral Oral    SpO2: 95% (!) 81% 93% 97%  Weight:    121 lb 14.6 oz (55.3 kg)  Height:       Weight change:   Intake/Output Summary (Last 24 hours) at 01/01/2018 [id number] Last data filed at 01/01/2018 [id number] Gross per 24 hour  Intake 1553.16 ml  Output 220 ml  Net 1333.16 ml     Exam: Heart:: Regular rate and rhythm, S1S2 present or without murmur or extra heart sounds Lungs: normal, clear to auscultation and clear to auscultation and percussion Abdomen: soft, nontender, normal bowel sounds   Lab Results: @LABTEST2 @ Micro Results: Recent Results (from the past 240 hour(s))  Body fluid culture     Status: None (Preliminary result)   Collection Time: 12/30/17  2:41 PM  Result Value Ref Range Status   Specimen Description   Final    PERITONEAL Performed at [facility], [address] [place], [place] [address]    Special Requests   Final    NONE Performed at [facility], [address] [place], [place] [address]    Gram Stain   Final    WBC PRESENT,BOTH PMN AND MONONUCLEAR NO ORGANISMS SEEN CYTOSPIN SMEAR    Culture   Final    NO GROWTH 1 DAY Performed at [facility], [address] [place], [place] [address]    Report Status PENDING  Incomplete   Studies/Results: Koreas Paracentesis  Result Date: 12/30/2017 INDICATION: NASH cirrhosis. Abdominal distension. Ascites. Request  for paracentesis EXAM: ULTRASOUND GUIDED RIGHT LOWER QUADRANT PARACENTESIS MEDICATIONS: None. COMPLICATIONS: None immediate. PROCEDURE: Informed written consent was obtained from the patient after a discussion of the risks, benefits and alternatives to treatment. A timeout was performed prior to the initiation of the procedure. Initial ultrasound scanning demonstrates a large amount of ascites within the right lower abdominal quadrant. The right lower abdomen was prepped and draped in the usual sterile fashion. 1% lidocaine with epinephrine was used for local anesthesia. Following this, a 6 Fr Safe-T-Centesis catheter was introduced. An ultrasound image was saved for documentation purposes. The paracentesis was performed. The catheter was removed and a dressing was applied. The patient tolerated the procedure well without immediate post procedural complication. FINDINGS: A total of approximately 7.4 L of clear, amber fluid was removed. IMPRESSION: Successful ultrasound-guided paracentesis yielding 7.4 liters of peritoneal fluid. Read by Brayton ElKevin Bruning PA-C Electronically Signed   By: Simonne ComeJohn  Watts M.D.   On: 12/30/2017 15:21   [place]as Ascites (abdomen Limited)  Result Date: 12/30/2017 CLINICAL DATA:  Evaluate for ascites. EXAM: LIMITED ABDOMEN ULTRASOUND FOR ASCITES TECHNIQUE: Limited ultrasound survey for ascites was performed in all four abdominal quadrants. COMPARISON:  None. FINDINGS: Moderate amount of ascites is noted in the abdomen, most evident in the lower quadrants. Liver appears smaller and nodular with a coarsened echotexture consistent  with cirrhosis. IMPRESSION: Moderate ascites. Electronically Signed   By: Amie Portland M.D.   On: 12/30/2017 09:27   Medications: I have reviewed the patient's current medications. Scheduled Meds: . feeding supplement (ENSURE ENLIVE)  237 mL Oral TID BM  . FLUoxetine  20 mg Oral Daily  . fluticasone  2 spray Each Nare Daily  . furosemide  40 mg Oral Daily  .  gabapentin  300 mg Oral TID  . levothyroxine  75 mcg Oral QAC breakfast  . methylPREDNISolone (SOLU-MEDROL) injection  60 mg Intravenous Q24H  . pantoprazole  40 mg Oral BID AC  . spironolactone  100 mg Oral Daily   Continuous Infusions: . azithromycin Stopped (12/31/17 0912)  . cefTRIAXone (ROCEPHIN)  IV Stopped (12/31/17 0827)  . octreotide  (SANDOSTATIN)    IV infusion 50 mcg/hr (01/01/18 0311)   PRN Meds:.albuterol, docusate sodium, loratadine, ondansetron (ZOFRAN) IV, oxyCODONE-acetaminophen   Assessment: Principal Problem:   Ascites Active Problems:   Melena  CBC Latest Ref Rng & Units 01/01/2018 12/31/2017 12/30/2017  WBC 3.6 - 11.0 K/uL 5.4 3.2(L) 7.2  Hemoglobin 12.0 - 16.0 g/dL 8.1(X) 7.2(L) 10.0(L)  Hematocrit 35.0 - 47.0 % 26.3(L) 21.3(L) 30.4(L)  Platelets 150 - 440 K/uL 58(L) 45(L) 87(L)   Angela Austin 72 y.o. female admitted with decompensation cirrhosis , melena . S/p EGD and banding of esophageal varices with stigmata of bleeding.   Plan: 1. Low salt diet . Advance diet  2. Complete 72 hours of octreotide/PPI and then D/c and change to oral PPI 3. Commence on Nadolol 20 mg at discharge.  4. Lasix 40 mg once a day(creatinine rising)  and aldactone 100 mg . Can further increase dose of aldactone in 3-5 days depending on BMP 5.Repeat EGD in 4 weeks as an outpatient.     LOS: 2 days   [name], MD 01/01/2018, 8:32 AM

## 2018-01-01 NOTE — Care Management Note (Addendum)
Case Management Note  Patient Details  Name: Angela Austin MRN: [id number] Date of Birth: 04/29/1946  Subjective/Objective:  Patient admitted [facility] with ascites. RNCM consult prompted since patient is on chronic oxygen 2L via [place]. She is currently set up with [employer] and has been on O2 for 5 years. She also uses a rollator walker in home. No other DME. Has limited transportation but her caregiver is her 72 year old stepfather and per her he is able to help her with all of her activities of daily livinig such as bathing, dressing, preparing meals and ambulation. Also her daughter Angela Austin [phone] is involved in her care. She uses [facility] in [place] and has no issues obtaining her medications.                    Action/Plan:  RNCM will continue to follow as patient expresses desire for HHPT at discharge. PT eval pending.  Expected Discharge Date:  01/01/18               Expected Discharge Plan:     In-House Referral:     Discharge planning Services     Post Acute Care Choice:    Choice offered to:     DME Arranged:    DME Agency:     HH Arranged:    HH Agency:     Status of Service:     If discussed at [employer] Meetings, dates discussed:    Additional Comments:  Angela Austin A, RN 01/01/2018, 3:11 PM

## 2018-01-01 NOTE — Progress Notes (Signed)
[employer] [facility] [place] [facility]   PATIENT NAME: Angela Austin    MR#:  [id number]  DATE OF BIRTH:  [birth year]  SUBJECTIVE:   Patient doing okay this morning.  No dark-colored stools noted.  No abdominal pain  REVIEW OF SYSTEMS:    Review of Systems  Constitutional: Negative for fever, chills weight loss HENT: Negative for ear pain, nosebleeds, congestion, facial swelling, rhinorrhea, neck pain, neck stiffness and ear discharge.   Respiratory: Negative for cough, shortness of breath, wheezing  Cardiovascular: Negative for chest pain, palpitations and leg swelling.  Gastrointestinal: Negative for heartburn, abdominal pain, vomiting, diarrhea or consitpation Genitourinary: Negative for dysuria, urgency, frequency, hematuria Musculoskeletal: Negative for back pain or joint pain Neurological: Negative for dizziness, seizures, syncope, focal weakness,  numbness and headaches.  Hematological: Does not bruise/bleed easily.  Psychiatric/Behavioral: Negative for hallucinations, confusion, dysphoric mood    Tolerating Diet: yes      DRUG ALLERGIES:   Allergies  Allergen Reactions  . Metformin Diarrhea  . Amoxicillin-Pot Clavulanate Other (See Comments)    Other reaction(s): Unknown  . Codeine   . Erythromycin   . Erythromycin Ethylsuccinate     Other reaction(s): Unknown  . Levofloxacin Other (See Comments)    Jittery, nausea  . Metformin And Related     GI bleed  . Morphine Other (See Comments) and Nausea And Vomiting    caused severe cramping   . Morphine And Related   . Morphine Sulfate Er Beads Other (See Comments)    caused severe cramping   . Omeprazole Other (See Comments)    GI side effects GI side effects  . Spiriva Handihaler [Tiotropium Bromide Monohydrate] Swelling    Tongue  . Statins Other (See Comments)    Other reaction(s): Muscle Pain  . Tetracyclines & Related   . Ace Inhibitors Rash    VITALS:  Blood pressure (!)  169/58, pulse (!) 107, temperature 98.1 F (36.7 C), temperature source Oral, resp. rate 19, height 5\' 5"  (1.651 m), weight 55.3 kg (121 lb 14.6 oz), SpO2 97 %.  PHYSICAL EXAMINATION:  Constitutional: Appears well-developed and well-nourished. No distress. HENT: Normocephalic. Marland Kitchen Oropharynx is clear and moist.  Eyes: Conjunctivae and EOM are normal. PERRLA, no scleral icterus.  Neck: Normal ROM. Neck supple. No JVD. No tracheal deviation. CVS: RRR, S1/S2 +, no murmurs, no gallops, no carotid bruit.  Pulmonary: Effort and breath sounds normal, no stridor, rhonchi, wheezes, rales.  Abdominal: Soft. BS +, positive distension, no tenderness, rebound or guarding.  Musculoskeletal: Normal range of motion. No edema and no tenderness.  Neuro: Alert. CN 2-12 grossly intact. No focal deficits. Skin: Skin is warm and dry. No rash noted. Psychiatric: Normal mood and affect.      LABORATORY PANEL:   CBC Recent Labs  Lab 01/01/18 0452  WBC 5.4  HGB 8.5*  HCT 26.3*  PLT 58*   ------------------------------------------------------------------------------------------------------------------  Chemistries  Recent Labs  Lab 12/30/17 0626  01/01/18 0452  NA 136   < > 132*  K 3.7   < > 4.5  CL 94*   < > 95*  CO2 31   < > 26  GLUCOSE 311*   < > 532*  BUN 30*   < > 56*  CREATININE 1.73*   < > 1.89*  CALCIUM 9.0   < > 8.4*  AST 32  --   --   ALT 17  --   --   ALKPHOS 86  --   --  BILITOT 1.3*  --   --    < > = values in this interval not displayed.   ------------------------------------------------------------------------------------------------------------------  Cardiac Enzymes No results for input(s): TROPONINI in the last 168 hours. ------------------------------------------------------------------------------------------------------------------  RADIOLOGY:  [place] Paracentesis  Result Date: 12/30/2017 INDICATION: NASH cirrhosis. Abdominal distension. Ascites. Request for  paracentesis EXAM: ULTRASOUND GUIDED RIGHT LOWER QUADRANT PARACENTESIS MEDICATIONS: None. COMPLICATIONS: None immediate. PROCEDURE: Informed written consent was obtained from the patient after a discussion of the risks, benefits and alternatives to treatment. A timeout was performed prior to the initiation of the procedure. Initial ultrasound scanning demonstrates a large amount of ascites within the right lower abdominal quadrant. The right lower abdomen was prepped and draped in the usual sterile fashion. 1% lidocaine with epinephrine was used for local anesthesia. Following this, a 6 Fr Safe-T-Centesis catheter was introduced. An ultrasound image was saved for documentation purposes. The paracentesis was performed. The catheter was removed and a dressing was applied. The patient tolerated the procedure well without immediate post procedural complication. FINDINGS: A total of approximately 7.4 L of clear, amber fluid was removed. IMPRESSION: Successful ultrasound-guided paracentesis yielding 7.4 liters of peritoneal fluid. Read by Brayton ElKevin Bruning PA-C Electronically Signed   By: Simonne ComeJohn  Watts M.D.   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  1.  Ascites: This is etiology of patient's shortness of breath  He is status post Successful ultrasound-guided paracentesis yielding 7.4 liters of peritoneal fluid.  2.  GI bleed due to large esophageal varices: Patient status post banding x4 Advance diet to soft Octreotide for total 72 hours which was started on Saturday Continue PPI twice daily Propranolol 20 twice daily after discontinuation of octreotide drip Repeat EGD 4 weeks Titration of diuretics with Lasix and Aldactone if creatinine tolerates Follow CBC  3.  Diabetes: Patient reports she was taken off of her diabetic medications a few months ago as her blood sugars were  low. Follow-up on A1c Diabetes RN consultant requested For now start sliding scale and low-dose Lantus Patient may be able to be discharged on oral glyburide which she was taking as an outpatient.  4.  Mild COPD exacerbation: Patient without wheezing today Discontinue IV steroids and start prednisone 30 mg for 2 days then stop  5.  Hypothyroidism: Continue Synthroid  Management plans discussed with the patient and she is in agreement.  CODE STATUS: full  TOTAL TIME TAKING CARE OF THIS PATIENT: 27 minutes.    PT evaluation for discharge planning POSSIBLE D/C tomorrow, DEPENDING ON CLINICAL CONDITION.   Blanton Kardell M.D on 01/01/2018 at 11:13 AM  Between 7am to 6pm - Pager - [phone] After 6pm go to [email or web address] - password [email or web address]  [facility]  Office  [phone]  CC: Primary care physician; Kandyce RudBabaoff, Marcus, MD  Note: This dictation was prepared with Dragon dictation along with smaller phrase technology. Any transcriptional errors that result from this process are unintentional.

## 2018-01-01 NOTE — Progress Notes (Signed)
PT Cancellation Note  Patient Details Name: Angela Austin Angela DowMRN: [id number] DOB: 1946-07-01   Cancelled Treatment:    Reason Eval/Treat Not Completed: Medical issues which prohibited therapy. Order received and chart reviewed. Attempted to see patient however RN Anabella requested to defer PT evaluation until first thing tomorrow AM. Per RN pt has been weak today and had a desaturation episode this morning. Will hold PT evaluation and attempt first thing in the AM as able.  Sharalyn InkJason D Sanuel Ladnier PT, DPT   [name] 01/01/2018, 4:01 PM

## 2018-01-01 NOTE — Progress Notes (Signed)
Pharmacy Antibiotic Note  Angela Austin is a 72 y.o. female admitted on 12/30/2017 with pneumonia.  Pharmacy has been consulted for levaquin dosing.  Plan: Patient has received Azithromycin and Ceftriaxone starting on 12/30/17 now transitioning to Levaquin. Will begin Levaquin 250 mg po q24h for crcl 23.8 ml/min.   Do not give within 2 hours of ENSURE supplement    Height: 5\' 5"  (165.1 cm) Weight: 121 lb 14.6 oz (55.3 kg) IBW/kg (Calculated) : 57  Temp (24hrs), Avg:98.1 F (36.7 C), Min:98 F (36.7 C), Max:98.4 F (36.9 C)  Recent Labs  Lab 12/30/17 0626 12/31/17 0723 01/01/18 0452  WBC 7.2 3.2* 5.4  CREATININE 1.73* 1.75* 1.89*    Estimated Creatinine Clearance: 23.8 mL/min (A) (by C-G formula based on SCr of 1.89 mg/dL (H)).    Allergies  Allergen Reactions  . Metformin Diarrhea  . Amoxicillin-Pot Clavulanate Other (See Comments)    Other reaction(s): Unknown  . Codeine   . Erythromycin   . Erythromycin Ethylsuccinate     Other reaction(s): Unknown  . Levofloxacin Other (See Comments)    Jittery, nausea  . Metformin And Related     GI bleed  . Morphine Other (See Comments) and Nausea And Vomiting    caused severe cramping   . Morphine And Related   . Morphine Sulfate Er Beads Other (See Comments)    caused severe cramping   . Omeprazole Other (See Comments)    GI side effects GI side effects  . Spiriva Handihaler [Tiotropium Bromide Monohydrate] Swelling    Tongue  . Statins Other (See Comments)    Other reaction(s): Muscle Pain  . Tetracyclines & Related   . Ace Inhibitors Rash    Antimicrobials this admission: CTX/Azith 6/1 >> 6/3 levaquin 6/3 >>    Dose adjustments this admission:    Microbiology results:   BCx:     UCx:      Sputum:      MRSA PCR:   6/1 peritoneal cx: NGTD  Thank you for allowing pharmacy to be a part of this patient's care.  Tamekia Rotter A 01/01/2018 9:37 AM

## 2018-01-01 NOTE — Progress Notes (Signed)
Inpatient Diabetes Program Recommendations  AACE/ADA: New Consensus Statement on Inpatient Glycemic Control (2015)  Target Ranges:  Prepandial:   less than 140 mg/dL      Peak postprandial:   less than 180 mg/dL (1-2 hours)      Critically ill patients:  140 - 180 mg/dL   Lab Results  Component Value Date   GLUCAP 504 (HH) 01/01/2018   HGBA1C 8.0 (A) 07/11/2014    Review of Glycemic ControlResults for Angela Austin, Angela Austin (MRN [id number]) as of 01/01/2018 09:49  Ref. Range 01/01/2018 05:40  Glucose-Capillary Latest Ref Range: 65 - 99 mg/dL 409504 (HH)   Diabetes history: None Outpatient Diabetes medications: None Current orders for Inpatient glycemic control:  Novolog moderate tid with meals, Lantus 8 units daily Prednisone 50 mg with breakfast Inpatient Diabetes Program Recommendations:   Referral received. Note hyperglycemia with steroids.  Note A1C pending- however unsure if this will be accurate due to low hemoglobin.   Agree with current orders.  Steroids tapered from IV to PO.  If blood sugars remain elevated, may consider adding Novolog 2-3 units tid with meals.    Thanks,  Beryl MeagerJenny Zanai Mallari, RN, BC-ADM Inpatient Diabetes Coordinator Pager [phone] (8a-5p)

## 2018-01-01 NOTE — Progress Notes (Signed)
Initial Nutrition Assessment  DOCUMENTATION CODES:   Non-severe (moderate) malnutrition in context of chronic illness  INTERVENTION:   Premier Protein BID, each supplement provides 160 kcal and 30 grams of protein.   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 Pt found to have to PNA and ascites now s/p thoracentesis with 7.4L fluid removal 6/1 and EDG 6/2   Met with pt in room today. Pt reports poor appetite and oral intake since March. Pt reports that she has been having abdominal pain, nausea, early satiety and taste changes. Pt reports that she has not been drinking any supplements or taking any vitamins at home. Per chart, pt with wt gain secondary to ascites; it is difficult to determine if pt has had any true weight loss. Pt reports eating 50% of her meals today. Pt has been on full liquid diet, now advanced to soft diet. Pt did drink a Engineer, civil (consulting) today. RD discussed high protein reduced sodium diet with pt today. Recommended supplements to help her meet her estimated needs and daily MVI. Pt with hyperglycemia today. Continue Premier Protein.   Medications reviewed and include: lasix, insulin, synthroid, protonix, prednisone, aldactone, octreotide  Labs reviewed: Na 132(L), K 4.5 wnl, Cl 95(L), BUN 56(H), creat 1.89(H), Ca 8.4(L) Hgb 8.5(L), Hct 26.3(L) cbgs- 504, 366 x 24 hrs AIC 7.8(H)- 6/2  NUTRITION - FOCUSED PHYSICAL EXAM:    Most  Recent Value  Orbital Region  No depletion  Upper Arm Region  Moderate depletion  Thoracic and Lumbar Region  Moderate depletion  Buccal Region  Mild depletion  Temple Region  Mild depletion  Clavicle Bone Region  Moderate depletion  Clavicle and Acromion Bone Region  Moderate depletion  Scapular Bone Region  Moderate depletion  Dorsal Hand  Severe depletion  Patellar Region  Moderate depletion  Anterior Thigh Region  Moderate depletion  Posterior Calf Region  Moderate depletion  Edema (RD Assessment)  None  Hair  Reviewed  Eyes  Reviewed  Mouth  Reviewed  Skin  Reviewed  Nails  Reviewed     Diet Order:   Diet Order           DIET SOFT Room service appropriate? Yes; Fluid consistency: Thin  Diet effective now         EDUCATION NEEDS:   Education needs have been addressed  Skin:  Skin Assessment: Reviewed RN Assessment(ecchymosis )  Last BM:  6/2- type 7  Height:   Ht Readings from Last 1 Encounters:  12/30/17 '5\' 5"'  (1.651 m)    Weight:   Wt Readings from Last 1 Encounters:  01/01/18 121 lb 14.6 oz (55.3 kg)    Ideal Body Weight:  56.8 kg  BMI:  Body mass index is 20.29 kg/m.  Estimated Nutritional Needs:   Kcal:  1400-1600kcal/day   Protein:  88-99g/day   Fluid:  per MD  Koleen Distance MS, RD, LDN Pager #- [phone] Office#- [phone] After Hours Pager: [phone]

## 2018-01-02 LAB — BASIC METABOLIC PANEL
ANION GAP: 8 (ref 5–15)
BUN: 61 mg/dL — ABNORMAL HIGH (ref 6–20)
CALCIUM: 8.7 mg/dL — AB (ref 8.9–10.3)
CO2: 32 mmol/L (ref 22–32)
Chloride: 96 mmol/L — ABNORMAL LOW (ref 101–111)
Creatinine, Ser: 1.81 mg/dL — ABNORMAL HIGH (ref 0.44–1.00)
GFR, EST AFRICAN AMERICAN: 31 mL/min — AB (ref >60)
GFR, EST NON AFRICAN AMERICAN: 27 mL/min — AB (ref >60)
Glucose, Bld: 148 mg/dL — ABNORMAL HIGH (ref 65–99)
Potassium: 4.6 mmol/L (ref 3.5–5.1)
SODIUM: 136 mmol/L (ref 135–145)

## 2018-01-02 LAB — GLUCOSE, CAPILLARY
GLUCOSE-CAPILLARY: 157 mg/dL — AB (ref 65–99)
Glucose-Capillary: 236 mg/dL — ABNORMAL HIGH (ref 65–99)

## 2018-01-02 LAB — CBC
HCT: 25.8 % — ABNORMAL LOW (ref 35.0–47.0)
Hemoglobin: 8.4 g/dL — ABNORMAL LOW (ref 12.0–16.0)
MCH: 26.8 pg (ref 26.0–34.0)
MCHC: 32.7 g/dL (ref 32.0–36.0)
MCV: 82.1 fL (ref 80.0–100.0)
Platelets: 51 10*3/uL — ABNORMAL LOW (ref 150–440)
RBC: 3.14 MIL/uL — ABNORMAL LOW (ref 3.80–5.20)
RDW: 15.5 % — AB (ref 11.5–14.5)
WBC: 3.6 10*3/uL (ref 3.6–11.0)

## 2018-01-02 LAB — CYTOLOGY - NON PAP

## 2018-01-02 MED ORDER — PREDNISONE 10 MG PO TABS: 30.0000 mg | ORAL_TABLET | Freq: Every day | ORAL | 0 refills | Status: AC

## 2018-01-02 MED ORDER — PREMIER PROTEIN SHAKE: 11.0000 [oz_av] | Freq: Two times a day (BID) | ORAL | 0 refills | Status: AC

## 2018-01-02 MED ORDER — FUROSEMIDE 40 MG PO TABS: 40.0000 mg | ORAL_TABLET | Freq: Every day | ORAL | 0 refills | Status: DC

## 2018-01-02 MED ORDER — LEVOFLOXACIN 250 MG PO TABS: 250.0000 mg | ORAL_TABLET | Freq: Every day | ORAL | 0 refills | Status: AC

## 2018-01-02 MED ORDER — NADOLOL 20 MG PO TABS: 20.0000 mg | ORAL_TABLET | Freq: Every day | ORAL | 11 refills | Status: DC

## 2018-01-02 MED ORDER — SPIRONOLACTONE 100 MG PO TABS: 100.0000 mg | ORAL_TABLET | Freq: Every day | ORAL | 0 refills | Status: DC

## 2018-01-02 MED ORDER — GLYBURIDE 2.5 MG PO TABS: 2.5000 mg | ORAL_TABLET | Freq: Every day | ORAL | 11 refills | Status: DC

## 2018-01-02 NOTE — Care Management Note (Signed)
Case Management Note  Patient Details  Name: [name] MRN: [id number] Date of Birth: [birth year]  Patient to discharge home today.  PT has assessed patient and recommends home health PT. Patient agreeable to home health services.  Does not have preference of home health agency.  [facility]  RW to be delivered to room prior to discharge.   Subjective/Objective:                    Action/Plan:   Expected Discharge Date:  01/02/18               Expected Discharge Plan:  Home w Home Health Services  In-House Referral:     Discharge planning Services  CM Consult  Post Acute Care Choice:  Home Health, Durable Medical Equipment Choice offered to:  Patient  DME Arranged:  Walker rolling DME Agency:  [facility]  HH Arranged:  RN, PT, Nurse's Aide HH Agency:  [facility]  Status of Service:  Completed, signed off  If discussed at Long Length of Stay Meetings, dates discussed:    Additional Comments:  Chapman FitchBOWEN, [place] Gan T, RN 01/02/2018, 2:41 PM

## 2018-01-02 NOTE — Progress Notes (Signed)
SATURATION QUALIFICATIONS: (This note is used to comply with regulatory documentation for home oxygen)  Patient Saturations on Room Air at Rest = 88 %  Patient Saturations on Room Air while Ambulating =  83%  Patient Saturations on 4L Liters of oxygen while Ambulating = 92%  Please briefly explain why patient needs home oxygen:

## 2018-01-02 NOTE — Progress Notes (Addendum)
Physical Therapy Evaluation Patient Details Name: Angela Austin MRN: [id number] DOB: Aug 10, 1945 Today's Date: 01/02/2018   History of Present Illness  Angela Austin  is a 72 y.o. female with a known history of COPD on chronic oxygen, liver cirrhosis and thrombocytopenia. She has worsening abd ascites for last few months. She also had some URI symptoms and received some Abx last month. Last few days she has had worsening cough and SOB and abd swelling with discomfort. She also had one episode of black stool so came to ER. Noted stable Hb, but distended abdomen. Denies vomiting, fever. She is currently admitted for ascites, GI bleed secondary to esophageal varices, mild COPD exacerbation, and PNA.   Clinical Impression  Pt admitted with above diagnosis. Pt currently with functional limitations due to the deficits listed below (see PT Problem List).  Pt is modified independent and CGA for transfers and ambulation. She is able to ambulate from bed to hallway and back to bed with rolling walker. No overt LOB. Pt with heavy UE reliance on walker due to LE weakness. DOE observed and SaO2 drops to 78% during ambulation on 2L/min O2. Pt requires standing rest break of 2-3 minutes on 4 L/min O2 in order for SaO2 to recover to 88%. Pt notably fatigued after ambulation. She is adamant about going home and recently has only needed to ambulate from bed to bathroom and back. Stepfather is caregiver and assists with ADLs/IADLs. While I have concerns about her safety and fall risk returning home with [facility] PT is an acceptable plan. Pt will need a rolling walker as a rollator is not stable enough for her to support herself secondary to LE weakness. Encouraged regular ambulation and activity after returning home to prevent further deconditioning. Pt will benefit from PT services to address deficits in strength, balance, and mobility in order to return to full function at home.   SaO2 on 2 L/min via nasal cannula at  rest = 94% SaO2 on 2 L/min while ambulating = 78% SaO2 on 4 L/min liters of O2 while ambulating = 88%  Pt is on 2 L/min chronic O2 at home      Follow Up Recommendations Home health PT    Equipment Recommendations  Rolling walker with 5" wheels;Other (comment)(Has rollator but needs rolling walker)    Recommendations for Other Services       Precautions / Restrictions Precautions Precautions: Fall Restrictions Weight Bearing Restrictions: No      Mobility  Bed Mobility Overal bed mobility: Modified Independent             General bed mobility comments: HOB elevated and use of bed rails  Transfers Overall transfer level: Needs assistance Equipment used: Rolling walker (2 wheeled) Transfers: Sit to/from Stand Sit to Stand: Min guard         General transfer comment: Pt demonstrates safe hand placement. Increased time required to come to standing and bilateral LE weakness noted. Stable in standing with bilateral UE support on rollin gwalker  Ambulation/Gait Ambulation/Gait assistance: Min guard Ambulation Distance (Feet): 35 Feet Assistive device: Rolling walker (2 wheeled) Gait Pattern/deviations: Decreased step length - right;Decreased step length - left Gait velocity: Decreased Gait velocity interpretation: <1.31 ft/sec, indicative of household ambulator General Gait Details: Pt is able to ambulate from bed to hallway and back to bed with rolling walker. CGA provided by therapist but no overt LOB. Pt with heavy UE reliance on walker due to LE weakness. DOE observed and SaO2 drops to 78%  during ambulation on 2L/min O2. Pt requires standing rest break of 2-3 minutes on 4 L/min O2 in order for SaO2 to recover to 88%. Pt notably fatigued after ambulation. During ambulation pt instructed in proper use of rolling walker for safety and stability  Stairs            Wheelchair Mobility    Modified Rankin (Stroke Patients Only)       Balance Overall balance  assessment: Needs assistance Sitting-balance support: No upper extremity supported Sitting balance-Leahy Scale: Good     Standing balance support: No upper extremity supported Standing balance-Leahy Scale: Fair Standing balance comment: Able to maintain wide stance without UE support. Requires UE support to place feet together but able to maintain once in position without support. Positive Rhomberg, unable to perform tandem balance, and unable to attempt single leg balance                             Pertinent Vitals/Pain Pain Assessment: No/denies pain    Home Living Family/patient expects to be discharged to:: Private residence Living Arrangements: Spouse/significant other Available Help at Discharge: Family;Other (Comment)(91 yo step-father is her caregiver) Type of Home: Other(Comment)(Townhouse) Home Access: Level entry     Home Layout: One level Home Equipment: Walker - 4 wheels;Cane - single point;Bedside commode;Shower seat;Other (comment)(Home O2 (2L/min), adjustable bed)      Prior Function Level of Independence: Needs assistance   Gait / Transfers Assistance Needed: Pt reports that she ambulates without an assistive device very limited distances from bed to bathroom and back. Denies falls  ADL's / Homemaking Assistance Needed: Requires assist recently since March with ADLs/IADLs.        Hand Dominance   Dominant Hand: Right    Extremity/Trunk Assessment   Upper Extremity Assessment Upper Extremity Assessment: Generalized weakness    Lower Extremity Assessment Lower Extremity Assessment: Generalized weakness       Communication   Communication: No difficulties  Cognition Arousal/Alertness: Awake/alert Behavior During Therapy: WFL for tasks assessed/performed Overall Cognitive Status: Within Functional Limits for tasks assessed                                        General Comments      Exercises     Assessment/Plan     PT Assessment Patient needs continued PT services  PT Problem List Decreased strength;Decreased activity tolerance;Decreased balance;Decreased mobility       PT Treatment Interventions DME instruction;Gait training;Stair training;Functional mobility training;Therapeutic activities;Therapeutic exercise;Balance training    PT Goals (Current goals can be found in the Care Plan section)  Acute Rehab PT Goals Patient Stated Goal: "I want to go home." Pt does not want to go to rehab PT Goal Formulation: With patient/family Time For Goal Achievement: 01/16/18 Potential to Achieve Goals: Fair    Frequency Min 2X/week   Barriers to discharge        Co-evaluation               AM-PAC PT "6 Clicks" Daily Activity  Outcome Measure Difficulty turning over in bed (including adjusting bedclothes, sheets and blankets)?: None Difficulty moving from lying on back to sitting on the side of the bed? : A Little Difficulty sitting down on and standing up from a chair with arms (e.g., wheelchair, bedside commode, etc,.)?: A Little Help needed moving to  and from a bed to chair (including a wheelchair)?: A Little Help needed walking in hospital room?: A Little Help needed climbing 3-5 steps with a railing? : A Lot 6 Click Score: 18    End of Session Equipment Utilized During Treatment: Gait belt;Oxygen Activity Tolerance: Patient limited by fatigue Patient left: in bed;with call bell/phone within reach;with bed alarm set Nurse Communication: Mobility status;Other (comment)(SaO2 readings with ambulation and O2 requirement) PT Visit Diagnosis: Unsteadiness on feet (R26.81);Muscle weakness (generalized) (M62.81)    Time: [id number] PT Time Calculation (min) (ACUTE ONLY): 25 min   Charges:   PT Evaluation $PT Eval Low Complexity: 1 Low PT Treatments $Gait Training: 8-22 mins   PT G Codes:        Sharalyn InkJason D Huprich PT, DPT    Huprich,Jason 01/02/2018, 9:18 AM

## 2018-01-02 NOTE — Care Management Important Message (Signed)
Copy of signed IM left with patient in room.  

## 2018-01-02 NOTE — Discharge Summary (Signed)
[facility] [place] [facility]   PATIENT NAME: Angela Austin    MR#:  [id number]  DATE OF BIRTH:  1946/03/04  DATE OF ADMISSION:  12/30/2017 ADMITTING PHYSICIAN: Altamese DillingVaibhavkumar Vachhani, MD  DATE OF DISCHARGE: 01/02/2018  PRIMARY CARE PHYSICIAN: Kandyce RudBabaoff, Marcus, MD    ADMISSION DIAGNOSIS:  Lower GI bleed [K92.2] Other ascites [R18.8] Ascites [R18.8] Community acquired pneumonia of left lower lobe of lung (HCC) [J18.1]  DISCHARGE DIAGNOSIS:  Principal Problem:   Ascites Active Problems:   Melena   SECONDARY DIAGNOSIS:   Past Medical History:  Diagnosis Date  . Cirrhosis of liver not due to alcohol (HCC)   . COPD (chronic obstructive pulmonary disease) (HCC)   . Depression   . Diabetes (HCC)   . GERD (gastroesophageal reflux disease)   . Hypothyroidism   . ITP (idiopathic thrombocytopenic purpura)   . Nephrolithiasis     HOSPITAL COURSE:  72 year old female with history of liver cirrhosis (not EtOH related) who presented with rectal bleeding shortness of breath.  1.  Ascites: This is etiology of patient's shortness of breath  He is status post Successful ultrasound-guided paracentesis yielding 7.4 liters of peritoneal fluid.  2.  GI bleed due to large esophageal varices: Patient status post banding x4 She has had octreotide for total 72 hours  She will continue PPI twice daily and nadolol 20 mg daily.  She will continue Lasix and Aldactone.  She needs a repeat EGD 4 weeks She will need titration of diuretics with Lasix and Aldactone if creatinine tolerates Hemoglobin has remained stable.  3.  Diabetes: Hemoglobin A1c is 7.8.  Patient will be restarted on glyburide 2.5 mg daily.  She will have close follow-up with her PCP. 4.  Mild COPD exacerbation: Patient without wheezing today. She will be discharged on  prednisone 30 mg for 2 days then stop  5.  Hypothyroidism: Continue Synthroid     DISCHARGE CONDITIONS AND DIET:   Stable for  discharge on diabetic diet  CONSULTS OBTAINED:  Treatment Team:  Toney ReilVanga, Rohini Reddy, MD Wyline MoodAnna, Kiran, MD  DRUG ALLERGIES:   Allergies  Allergen Reactions  . Metformin Diarrhea  . Amoxicillin-Pot Clavulanate Other (See Comments)    Other reaction(s): Unknown  . Codeine   . Erythromycin   . Erythromycin Ethylsuccinate     Other reaction(s): Unknown  . Levofloxacin Other (See Comments)    Jittery, nausea  . Metformin And Related     GI bleed  . Morphine Other (See Comments) and Nausea And Vomiting    caused severe cramping   . Morphine And Related   . Morphine Sulfate Er Beads Other (See Comments)    caused severe cramping   . Omeprazole Other (See Comments)    GI side effects GI side effects  . Spiriva Handihaler [Tiotropium Bromide Monohydrate] Swelling    Tongue  . Statins Other (See Comments)    Other reaction(s): Muscle Pain  . Tetracyclines & Related   . Ace Inhibitors Rash    DISCHARGE MEDICATIONS:   Allergies as of 01/02/2018      Reactions   Metformin Diarrhea   Amoxicillin-pot Clavulanate Other (See Comments)   Other reaction(s): Unknown   Codeine    Erythromycin    Erythromycin Ethylsuccinate    Other reaction(s): Unknown   Levofloxacin Other (See Comments)   Jittery, nausea   Metformin And Related    GI bleed   Morphine Other (See Comments), Nausea And Vomiting   caused severe cramping  Morphine And Related    Morphine Sulfate Er Beads Other (See Comments)   caused severe cramping    Omeprazole Other (See Comments)   GI side effects GI side effects   Spiriva Handihaler [tiotropium Bromide Monohydrate] Swelling   Tongue   Statins Other (See Comments)   Other reaction(s): Muscle Pain   Tetracyclines & Related    Ace Inhibitors Rash      Medication List    TAKE these medications   albuterol 1.25 MG/3ML nebulizer solution Commonly known as:  ACCUNEB Take 1 ampule by nebulization every 6 (six) hours as needed for wheezing.   PROVENTIL  HFA 108 (90 Base) MCG/ACT inhaler Generic drug:  albuterol Inhale 1 puff into the lungs every 4 (four) hours as needed.   FLUoxetine 20 MG tablet Commonly known as:  PROZAC Take 20 mg by mouth daily.   fluticasone 50 MCG/ACT nasal spray Commonly known as:  FLONASE PLACE 2 SPRAYS INTO BOTH NOSTRILS ONCE DAILY AS NEEDED FOR ALLERGIES   furosemide 40 MG tablet Commonly known as:  LASIX Take 1 tablet (40 mg total) by mouth daily. Start taking on:  01/03/2018 What changed:    medication strength  how much to take  when to take this   glyBURIDE 2.5 MG tablet Commonly known as:  DIABETA Take 1 tablet (2.5 mg total) by mouth daily with breakfast.   levofloxacin 250 MG tablet Commonly known as:  LEVAQUIN Take 1 tablet (250 mg total) by mouth daily for 2 days. Start taking on:  01/03/2018   levothyroxine 75 MCG tablet Commonly known as:  SYNTHROID, LEVOTHROID Take 75 mcg by mouth daily before breakfast.   loratadine 10 MG tablet Commonly known as:  CLARITIN Take 10 mg by mouth daily as needed for allergies.   nadolol 20 MG tablet Commonly known as:  CORGARD Take 1 tablet (20 mg total) by mouth daily.   pantoprazole 40 MG tablet Commonly known as:  PROTONIX Take 40 mg by mouth 2 (two) times daily.   predniSONE 10 MG tablet Commonly known as:  DELTASONE Take 3 tablets (30 mg total) by mouth daily with breakfast for 1 day. Start taking on:  01/03/2018   protein supplement shake Liqd Commonly known as:  PREMIER PROTEIN Take 325 mLs (11 oz total) by mouth 2 (two) times daily between meals.   spironolactone 100 MG tablet Commonly known as:  ALDACTONE Take 1 tablet (100 mg total) by mouth daily. Start taking on:  01/03/2018 What changed:    medication strength  how much to take  when to take this            Durable Medical Equipment  (From admission, onward)        Start     Ordered   01/02/18 1031  For home use only DME Walker rolling  Once    Question:   Patient needs a walker to treat with the following condition  Answer:  Weakness   01/02/18 1030        Today   CHIEF COMPLAINT:   She doing well this morning.  No dark-colored stools   VITAL SIGNS:  Blood pressure 132/62, pulse 88, temperature 97.7 F (36.5 C), temperature source Oral, resp. rate 20, height 5\' 5"  (1.651 m), weight 55.3 kg (121 lb 14.6 oz), SpO2 100 %.   REVIEW OF SYSTEMS:  Review of Systems  Constitutional: Positive for malaise/fatigue. Negative for chills and fever.  HENT: Negative.  Negative for ear discharge, ear pain,  hearing loss, nosebleeds and sore throat.   Eyes: Negative.  Negative for blurred vision and pain.  Respiratory: Negative.  Negative for cough, hemoptysis, shortness of breath and wheezing.   Cardiovascular: Negative.  Negative for chest pain, palpitations and leg swelling.  Gastrointestinal: Negative.  Negative for abdominal pain, blood in stool, diarrhea, nausea and vomiting.  Genitourinary: Negative.  Negative for dysuria.  Musculoskeletal: Negative.  Negative for back pain.  Skin: Negative.   Neurological: Negative for dizziness, tremors, speech change, focal weakness, seizures and headaches.  Endo/Heme/Allergies: Negative.  Does not bruise/bleed easily.  Psychiatric/Behavioral: Negative.  Negative for depression, hallucinations and suicidal ideas.     PHYSICAL EXAMINATION:  GENERAL:  72 y.o.-year-old patient lying in the bed with no acute distress.  NECK:  Supple, no jugular venous distention. No thyroid enlargement, no tenderness.  LUNGS: Normal breath sounds bilaterally, no wheezing, rales,rhonchi  No use of accessory muscles of respiration.  CARDIOVASCULAR: S1, S2 normal. No murmurs, rubs, or gallops.  ABDOMEN: Soft, non-tender, non-distended. Bowel sounds present. No organomegaly or mass.  EXTREMITIES: No pedal edema, cyanosis, or clubbing.  PSYCHIATRIC: The patient is alert and oriented x 3.  SKIN: No obvious rash, lesion, or  ulcer.   DATA REVIEW:   CBC Recent Labs  Lab 01/02/18 0511  WBC 3.6  HGB 8.4*  HCT 25.8*  PLT 51*    Chemistries  Recent Labs  Lab 12/30/17 0626  01/02/18 0511  NA 136   < > 136  K 3.7   < > 4.6  CL 94*   < > 96*  CO2 31   < > 32  GLUCOSE 311*   < > 148*  BUN 30*   < > 61*  CREATININE 1.73*   < > 1.81*  CALCIUM 9.0   < > 8.7*  AST 32  --   --   ALT 17  --   --   ALKPHOS 86  --   --   BILITOT 1.3*  --   --    < > = values in this interval not displayed.    Cardiac Enzymes No results for input(s): TROPONINI in the last 168 hours.  Microbiology Results  @MICRORSLT48 @  RADIOLOGY:  No results found.    Allergies as of 01/02/2018      Reactions   Metformin Diarrhea   Amoxicillin-pot Clavulanate Other (See Comments)   Other reaction(s): Unknown   Codeine    Erythromycin    Erythromycin Ethylsuccinate    Other reaction(s): Unknown   Levofloxacin Other (See Comments)   Jittery, nausea   Metformin And Related    GI bleed   Morphine Other (See Comments), Nausea And Vomiting   caused severe cramping    Morphine And Related    Morphine Sulfate Er Beads Other (See Comments)   caused severe cramping    Omeprazole Other (See Comments)   GI side effects GI side effects   Spiriva Handihaler [tiotropium Bromide Monohydrate] Swelling   Tongue   Statins Other (See Comments)   Other reaction(s): Muscle Pain   Tetracyclines & Related    Ace Inhibitors Rash      Medication List    TAKE these medications   albuterol 1.25 MG/3ML nebulizer solution Commonly known as:  ACCUNEB Take 1 ampule by nebulization every 6 (six) hours as needed for wheezing.   PROVENTIL HFA 108 (90 Base) MCG/ACT inhaler Generic drug:  albuterol Inhale 1 puff into the lungs every 4 (four) hours as needed.  FLUoxetine 20 MG tablet Commonly known as:  PROZAC Take 20 mg by mouth daily.   fluticasone 50 MCG/ACT nasal spray Commonly known as:  FLONASE PLACE 2 SPRAYS INTO BOTH NOSTRILS  ONCE DAILY AS NEEDED FOR ALLERGIES   furosemide 40 MG tablet Commonly known as:  LASIX Take 1 tablet (40 mg total) by mouth daily. Start taking on:  01/03/2018 What changed:    medication strength  how much to take  when to take this   glyBURIDE 2.5 MG tablet Commonly known as:  DIABETA Take 1 tablet (2.5 mg total) by mouth daily with breakfast.   levofloxacin 250 MG tablet Commonly known as:  LEVAQUIN Take 1 tablet (250 mg total) by mouth daily for 2 days. Start taking on:  01/03/2018   levothyroxine 75 MCG tablet Commonly known as:  SYNTHROID, LEVOTHROID Take 75 mcg by mouth daily before breakfast.   loratadine 10 MG tablet Commonly known as:  CLARITIN Take 10 mg by mouth daily as needed for allergies.   nadolol 20 MG tablet Commonly known as:  CORGARD Take 1 tablet (20 mg total) by mouth daily.   pantoprazole 40 MG tablet Commonly known as:  PROTONIX Take 40 mg by mouth 2 (two) times daily.   predniSONE 10 MG tablet Commonly known as:  DELTASONE Take 3 tablets (30 mg total) by mouth daily with breakfast for 1 day. Start taking on:  01/03/2018   protein supplement shake Liqd Commonly known as:  PREMIER PROTEIN Take 325 mLs (11 oz total) by mouth 2 (two) times daily between meals.   spironolactone 100 MG tablet Commonly known as:  ALDACTONE Take 1 tablet (100 mg total) by mouth daily. Start taking on:  01/03/2018 What changed:    medication strength  how much to take  when to take this            Durable Medical Equipment  (From admission, onward)        Start     Ordered   01/02/18 1031  For home use only DME Walker rolling  Once    Question:  Patient needs a walker to treat with the following condition  Answer:  Weakness   01/02/18 1030         Management plans discussed with the patient and she is in agreement. Stable for discharge home  Patient should follow up with pcp  CODE STATUS:     Code Status Orders  (From admission, onward)         Start     Ordered   12/30/17 1015  Full code  Continuous     12/30/17 1015    Code Status History    This patient has a current code status but no historical code status.      TOTAL TIME TAKING CARE OF THIS PATIENT: 38 minutes.    Note: This dictation was prepared with Dragon dictation along with smaller phrase technology. Any transcriptional errors that result from this process are unintentional.  Jim Philemon M.D on 01/02/2018 at 11:45 AM  Between 7am to 6pm - Pager - [phone] After 6pm go to [email or web address] - Social research officer, government  [facility]  Office  [phone]  CC: Primary care physician; Kandyce Rud, MD

## 2018-01-02 NOTE — Progress Notes (Signed)
SATURATION QUALIFICATIONS: (This note is used to comply with regulatory documentation for home oxygen)  Patient Saturations on Room Air at Rest = ***%  Patient Saturations on Room Air while Ambulating = ***%  Patient Saturations on *** Liters of oxygen while Ambulating = ***%  Please briefly explain why patient needs home oxygen: 

## 2018-01-03 LAB — BODY FLUID CULTURE: Culture: NO GROWTH

## 2018-01-05 ENCOUNTER — Telehealth: Payer: Self-pay

## 2018-01-05 NOTE — Telephone Encounter (Signed)
Flagged on EMMI report for answering no to receiving discharge papers and having unfilled prescriptions.  Called and spoke with patient.  She mentioned she did receive her discharge papers and that she received wonderful care during her stay, particularly mentioning Dr. Manson PasseyBrown in the ED.    She reports she has not been able to fill her prescription for nadolol due to pending insurance auth.  She said she has a follow up appointment with her PCP on Monday.  Encouraged her to mention it to her doctor to see if there was an alternative she could take.    Reports no other issues at this time.  I thanked her for her time and informed her that she would receive one more automated call in the next few days as a final check-in.

## 2018-01-08 ENCOUNTER — Ambulatory Visit: Payer: Medicare Other | Admitting: Gastroenterology

## 2018-01-09 ENCOUNTER — Telehealth: Payer: Self-pay

## 2018-01-09 NOTE — Telephone Encounter (Signed)
2nd EMMI Follow-up: Noted on the report that Ms. Hines had some other questions from the 2nd automated call.  Said she went to PCP on Monday and he answered her questions.  Home Health set up and Physical Therapy coming out this afternoon.  Still having swelling in legs and ankles so she is elevating them a lot per MD.  York SpanielSaid she received very good care here from all the staff. No other needs noted.

## 2018-01-11 ENCOUNTER — Other Ambulatory Visit: Payer: Self-pay

## 2018-01-11 DIAGNOSIS — K7460 Unspecified cirrhosis of liver: Principal | ICD-10-CM

## 2018-01-11 DIAGNOSIS — I8511 Secondary esophageal varices with bleeding: Secondary | ICD-10-CM

## 2018-01-11 DIAGNOSIS — I8510 Secondary esophageal varices without bleeding: Secondary | ICD-10-CM

## 2018-01-15 ENCOUNTER — Other Ambulatory Visit
Admission: RE | Admit: 2018-01-15 | Discharge: 2018-01-15 | Disposition: A | Discharge status: Home / Self Care | Payer: Medicare Other | Source: Ambulatory Visit | Attending: Gastroenterology | Admitting: Gastroenterology

## 2018-01-15 ENCOUNTER — Encounter: Payer: Self-pay | Admitting: Gastroenterology

## 2018-01-15 ENCOUNTER — Other Ambulatory Visit: Payer: Self-pay

## 2018-01-15 ENCOUNTER — Ambulatory Visit (INDEPENDENT_AMBULATORY_CARE_PROVIDER_SITE_OTHER): Payer: Medicare Other | Admitting: Gastroenterology

## 2018-01-15 VITALS — BP 117/61 | HR 60 | Wt 117.6 lb

## 2018-01-15 DIAGNOSIS — R188 Other ascites: Secondary | ICD-10-CM | POA: Diagnosis not present

## 2018-01-15 DIAGNOSIS — K7460 Unspecified cirrhosis of liver: Secondary | ICD-10-CM | POA: Diagnosis not present

## 2018-01-15 LAB — CBC
HEMATOCRIT: 32.7 % — AB (ref 35.0–47.0)
HEMOGLOBIN: 11 g/dL — AB (ref 12.0–16.0)
MCH: 27.6 pg (ref 26.0–34.0)
MCHC: 33.5 g/dL (ref 32.0–36.0)
MCV: 82.4 fL (ref 80.0–100.0)
Platelets: 104 10*3/uL — ABNORMAL LOW (ref 150–440)
RBC: 3.97 MIL/uL (ref 3.80–5.20)
RDW: 16.3 % — ABNORMAL HIGH (ref 11.5–14.5)
WBC: 9.1 10*3/uL (ref 3.6–11.0)

## 2018-01-15 LAB — COMPREHENSIVE METABOLIC PANEL
ALT: 27 U/L (ref 14–54)
ANION GAP: 12 (ref 5–15)
AST: 37 U/L (ref 15–41)
Albumin: 3.6 g/dL (ref 3.5–5.0)
Alkaline Phosphatase: 94 U/L (ref 38–126)
BILIRUBIN TOTAL: 1.3 mg/dL — AB (ref 0.3–1.2)
BUN: 48 mg/dL — ABNORMAL HIGH (ref 6–20)
CHLORIDE: 91 mmol/L — AB (ref 101–111)
CO2: 30 mmol/L (ref 22–32)
Calcium: 9.4 mg/dL (ref 8.9–10.3)
Creatinine, Ser: 2.2 mg/dL — ABNORMAL HIGH (ref 0.44–1.00)
GFR calc Af Amer: 25 mL/min — ABNORMAL LOW (ref >60)
GFR, EST NON AFRICAN AMERICAN: 21 mL/min — AB (ref >60)
Glucose, Bld: 171 mg/dL — ABNORMAL HIGH (ref 65–99)
POTASSIUM: 5.6 mmol/L — AB (ref 3.5–5.1)
Sodium: 133 mmol/L — ABNORMAL LOW (ref 135–145)
TOTAL PROTEIN: 6.4 g/dL — AB (ref 6.5–8.1)

## 2018-01-15 LAB — MAGNESIUM: MAGNESIUM: 2.1 mg/dL (ref 1.7–2.4)

## 2018-01-15 MED ORDER — ALBUMIN HUMAN 25 % IV SOLN: INTRAVENOUS | 0 refills | Status: DC

## 2018-01-15 NOTE — Progress Notes (Signed)
Angela Repressohini R Vanga, MD [address]  [address]  [place], [place] [address]  Main: [phone]  Fax: [phone]    Gastroenterology Consultation  Referring Provider:     Kandyce Austin, Marcus, MD Primary Care Physician:  Angela Austin, Marcus, MD Primary Gastroenterologist:  Dr. Arlyss Repressohini R Austin Reason for Consultation:     Decompensated cirrhosis        HPI:   Angela Austin is a 72 y.o. female referred by Dr. Kandyce Austin, Marcus, MD  for consultation & management of  decompensated cirrhosis. She was admitted to [facility] in early June 2019 secondary to 2 months history of progressively worsening abdominal distention and swelling of legs, her symptoms actually started in 09/2017, she also noticed black tarry stools for one day before presentation to the ER. She used to see Dr. Lutricia FeilPaul Austin until 2015. She was on furosemide 20 mg twice daily and spironolactone 50 mg daily. She reports that she is diagnosed with cirrhosis about 5 years ago when she presented with GI bleed. Her viral hepatitis panel was negative, anti-smooth muscle antibodies and antimitochondrial antibodies were negative. She thinks that her cirrhosis is secondary to long-term metformin use. She also has COPD and oxygen dependent. She had an ultrasound on admission and was found to have cirrhosis with moderate ascites and underwent paracentesis with 7.4 L of clear fluid removed. There was no evidence of SBP. she underwent EGD and was found to have large esophageal varices with stigmata of recent bleeding and underwent ligation. She was discharged home on furosemide, spironolactone, nadolol. Her creatinine was slowly rising at the time of discharge  Follow-up visit 01/15/2018: She feels weak, abdomen is distended, swelling of legs is better. She is accompanied by her daughter-in-law who helps her with medical decision making. She is in wheelchair and on home O2, currently at 3 L. She denies melena, hematemesis, rectal  bleeding. She is taking lactulose syrup and having 2-3 bowel movements daily. She is taking furosemide 40 mg daily, spironolactone 100 mg daily, and nadolol 20 mg daily. Her father who is 72 years old is actually helping her at home. Patient is concerned about help not being available anymore to take care of her at home. She does consume canned foods as she has hard time cooking food at home due to her physical condition.  NSAIDs: none  Antiplts/Anticoagulants/Anti thrombotics: none  GI Procedures: she had EGD on 05/23/2014 at [facility] Report not available EGD 12/31/17 Findings: - Normal duodenal bulb and second portion of the duodenum. - Portal hypertensive gastropathy. - Recently bleeding large (> 5 mm) esophageal varices. Incompletely eradicated. Bandedx4. - No specimens collected.  She never had a colonoscopy   Past Medical History:  Diagnosis Date  . Cirrhosis of liver not due to alcohol (HCC)   . COPD (chronic obstructive pulmonary disease) (HCC)   . Depression   . Diabetes (HCC)   . GERD (gastroesophageal reflux disease)   . Hypothyroidism   . ITP (idiopathic thrombocytopenic purpura)   . Nephrolithiasis     Past Surgical History:  Procedure Laterality Date  . CATARACT EXTRACTION    . ESOPHAGOGASTRODUODENOSCOPY N/A 12/31/2017   Procedure: ESOPHAGOGASTRODUODENOSCOPY (EGD);  Surgeon: Toney ReilVanga, Angela Reddy, MD;  [facility] ENDOSCOPY;  Service: Gastroenterology;  Laterality: N/A;  . LUMBAR DISC SURGERY    . TONSILLECTOMY AND ADENOIDECTOMY      Prior to Admission medications   Medication Sig Start Date End Date Taking? Authorizing Provider  albuterol (ACCUNEB) 1.25 MG/3ML nebulizer solution  Take 1 ampule by nebulization every 6 (six) hours as needed for wheezing.    [provider]  albuterol (PROVENTIL HFA) 108 (90 Base) MCG/ACT inhaler Inhale 1 puff into the lungs every 4 (four) hours as needed.     [provider]  FLUoxetine (PROZAC) 20 MG capsule   12/08/17   [provider]  FLUoxetine (PROZAC) 20 MG tablet Take 20 mg by mouth daily.    [provider]  fluticasone (FLONASE) 50 MCG/ACT nasal spray PLACE 2 SPRAYS INTO BOTH NOSTRILS ONCE DAILY AS NEEDED FOR ALLERGIES 11/27/15   [provider]  furosemide (LASIX) 20 MG tablet  01/08/18   [provider]  furosemide (LASIX) 40 MG tablet Take 1 tablet (40 mg total) by mouth daily. 01/03/18   Angela Saran, MD  glyBURIDE (DIABETA) 2.5 MG tablet Take 1 tablet (2.5 mg total) by mouth daily with breakfast. 01/02/18 01/02/19  Angela Saran, MD  glyBURIDE micronized (GLYNASE) 6 MG tablet  12/06/17   [provider]  levothyroxine (SYNTHROID, LEVOTHROID) 75 MCG tablet Take 75 mcg by mouth daily before breakfast.  01/07/16   [provider]  loratadine (CLARITIN) 10 MG tablet Take 10 mg by mouth daily as needed for allergies.    [provider]  nadolol (CORGARD) 20 MG tablet Take 1 tablet (20 mg total) by mouth daily. 01/02/18 01/02/19  Angela Saran, MD  pantoprazole (PROTONIX) 40 MG tablet Take 40 mg by mouth 2 (two) times daily.    [provider]  protein supplement shake (PREMIER PROTEIN) LIQD Take 325 mLs (11 oz total) by mouth 2 (two) times daily between meals. 01/02/18 02/01/18  Angela Saran, MD  spironolactone (ALDACTONE) 100 MG tablet Take 1 tablet (100 mg total) by mouth daily. 01/03/18   Angela Saran, MD  spironolactone (ALDACTONE) 25 MG tablet  01/08/18   [provider]    Family History  Problem Relation Age of Onset  . Alzheimer's disease Mother   . COPD Mother   . Cancer Mother   . COPD Father   . Diabetes Mellitus II Father   . Heart attack Father      Social History   Tobacco Use  . Smoking status: Former Smoker    Years: 25.00    Types: Cigarettes  . Smokeless tobacco: Never Used  Substance Use Topics  . Alcohol use: No    Alcohol/week: 0.0 oz  . Drug use: No    Allergies as of 01/15/2018 - Review Complete  01/15/2018  Allergen Reaction Noted  . Metformin Diarrhea 12/13/2013  . Amoxicillin-pot clavulanate Other (See Comments) 12/13/2013  . Codeine  07/03/2014  . Erythromycin  07/03/2014  . Erythromycin ethylsuccinate  12/13/2013  . Levofloxacin Other (See Comments) 11/24/2015  . Metformin and related  07/14/2014  . Morphine Other (See Comments) and Nausea And Vomiting 12/13/2013  . Morphine and related  07/03/2014  . Morphine sulfate er beads Other (See Comments) 02/10/2015  . Omeprazole Other (See Comments) 12/13/2013  . Spiriva handihaler [tiotropium bromide monohydrate] Swelling 07/03/2014  . Statins Other (See Comments) 12/13/2013  . Tetracyclines & related  07/03/2014  . Ace inhibitors Rash 12/13/2013    Review of Systems:    All systems reviewed and negative except where noted in HPI.   Physical Exam:  BP 117/61 (BP Location: Left Arm, Patient Position: Sitting, Cuff Size: Normal)   Pulse 60   Wt 117 lb 9.6 oz (53.3 kg)   BMI 19.57 kg/m  No LMP recorded.  Patient is postmenopausal.  General:   Alert,  Ill-appearing, cachectic, in mild respiratory distress due to COPD Head:  Normocephalic and atraumatic, bitemporal wasting. Eyes:  Sclera clear, no icterus. Conjunctiva pink. Ears:  Normal auditory acuity. Nose:  No deformity, discharge, or lesions. Mouth:  No deformity or lesions,oropharynx pink & moist. Neck:  Supple; no masses or thyromegaly. Lungs:  Respirations even and unlabored, shallow breathing.  Clear throughout to auscultation.   No wheezes, crackles, or rhonchi. No acute distress. Heart:  Regular rate and rhythm; no murmurs, clicks, rubs, or gallops. Abdomen:  Normal bowel sounds. Soft, non-tender, diffusely distended, dull to percussion.  No guarding or rebound tenderness.   Rectal: Not performed Msk: significant muscle wasting of all extremities, Symmetrical without gross deformities.  Pulses:  Normal pulses noted. Extremities:  No clubbing, 1+edema.  No  cyanosis. Neurologic:  Alert and oriented x3;  grossly normal neurologically. Skin:  bruising in bilateral upper extremities, No jaundice. Lymph Nodes:  No significant cervical adenopathy. Psych:  Alert and cooperative. Normal mood and affect.  Imaging Studies: reviewed  Assessment and Plan:   NIEVE ROJERO is a 72 y.o. Caucasian female with history of diabetes, hypertension, end stage COPD on home O2, decompensated cirrhosis with ascites, history of upper GI bleed from esophageal varices, status post EGD in 12/2017 with EVL 4. 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 There was no evidence of SBP. SAAG>1.1 On furosemide 40 mg daily, spironolactone 100 mg daily. To arrange for therapeutic paracentesis with administration of IV albumin post-paracentesis Check BMP and mag today Varices: large esophageal varices status post EVLx4, portal hypertensive gastropathy Repeat EGD in 2-3 weeks, continue nadolol 20mg  daily HCC screening: recent ultrasound in 12/2017 with no evidence of liver lesions, check AFP Recheck ultrasound in 6 months  Extensively, discussed with patient and her daughter-in-law that she won't be a liver transplant candidate secondary to end-stage COPD which puts her at high risk for short-term mortality unless she gets both liver and lung transplant simultaneously. She is also severely malnourished which makes her prognosis even worse. I'm afraid, she may not be even candidate for TIPS due to history of COPD and possible underlying pulmonary hypertension. However, I will refer her to cardiology to evaluate for the presence of pulmonary hypertension and see if she would  be a candidate for TIPS. I strongly recommended her to pursue palliative care and she agreed to it   Follow up in 4 weeks   Angela Repress, MD

## 2018-01-16 ENCOUNTER — Other Ambulatory Visit: Payer: Self-pay

## 2018-01-16 DIAGNOSIS — I509 Heart failure, unspecified: Secondary | ICD-10-CM

## 2018-01-16 NOTE — Discharge Instructions (Signed)
Paracentesis, Care After °Refer to this sheet in the next few weeks. These instructions provide you with information about caring for yourself after your procedure. Your health care provider may also give you more specific instructions. Your treatment has been planned according to current medical practices, but problems sometimes occur. Call your health care provider if you have any problems or questions after your procedure. °What can I expect after the procedure? °After your procedure, it is common to have a small amount of clear fluid coming from the puncture site. °Follow these instructions at home: °· Return to your normal activities as told by your health care provider. Ask your health care provider what activities are safe for you. °· Take over-the-counter and prescription medicines only as told by your health care provider. °· Do not take baths, swim, or use a hot tub until your health care provider approves. °· Follow instructions from your health care provider about: °? How to take care of your puncture site. °? When and how you should change your bandage (dressing). °? When you should remove your dressing. °· Check your puncture area every day signs of infection. Watch for: °? Redness, swelling, or pain. °? Fluid, blood, or pus. °· Keep all follow-up visits as told by your health care provider. This is important. °Contact a health care provider if: °· You have redness, swelling, or pain at your puncture site. °· You start to have more clear fluid coming from your puncture site. °· You have blood or pus coming from your puncture site. °· You have chills. °· You have a fever. °Get help right away if: °· You develop chest pain or shortness of breath. °· You develop increasing pain, discomfort, or swelling in your abdomen. °· You feel dizzy or light-headed or you pass out. °This information is not intended to replace advice given to you by your health care provider. Make sure you discuss any questions you  have with your health care provider. °Document Released: 12/02/2014 Document Revised: 12/24/2015 Document Reviewed: 09/30/2014 °Elsevier Interactive Patient Education © 2018 [employer] ° °

## 2018-01-17 ENCOUNTER — Ambulatory Visit
Admission: RE | Admit: 2018-01-17 | Discharge: 2018-01-17 | Disposition: A | Discharge status: Home / Self Care | Payer: Medicare Other | Source: Ambulatory Visit | Attending: Gastroenterology | Admitting: Gastroenterology

## 2018-01-17 DIAGNOSIS — K7460 Unspecified cirrhosis of liver: Secondary | ICD-10-CM | POA: Insufficient documentation

## 2018-01-17 DIAGNOSIS — R188 Other ascites: Secondary | ICD-10-CM | POA: Diagnosis present

## 2018-01-17 MED ORDER — ALBUMIN HUMAN 25 % IV SOLN: 25.0000 g | Freq: Once | INTRAVENOUS | Status: DC

## 2018-01-17 MED ORDER — ALBUMIN HUMAN 25 % IV SOLN
INTRAVENOUS | Status: AC
  Administered 2018-01-17: 25 g
  Filled 2018-01-17: qty 100

## 2018-01-17 NOTE — Procedures (Signed)
US paracentesis without difficulty  Complications:  None  Blood Loss: none  See dictation in canopy pacs  

## 2018-01-24 ENCOUNTER — Encounter: Payer: Self-pay | Admitting: Gastroenterology

## 2018-01-25 ENCOUNTER — Other Ambulatory Visit: Payer: Self-pay

## 2018-01-25 ENCOUNTER — Encounter: Payer: Self-pay | Admitting: Anesthesiology

## 2018-01-25 ENCOUNTER — Ambulatory Visit: Payer: Medicare Other | Admitting: Anesthesiology

## 2018-01-25 ENCOUNTER — Ambulatory Visit
Admission: RE | Admit: 2018-01-25 | Discharge: 2018-01-25 | Disposition: A | Discharge status: Home / Self Care | Payer: Medicare Other | Source: Ambulatory Visit | Attending: Gastroenterology | Admitting: Gastroenterology

## 2018-01-25 ENCOUNTER — Encounter
Admission: RE | Disposition: A | Discharge status: Home / Self Care | Payer: Self-pay | Source: Ambulatory Visit | Attending: Gastroenterology

## 2018-01-25 DIAGNOSIS — K219 Gastro-esophageal reflux disease without esophagitis: Secondary | ICD-10-CM | POA: Diagnosis not present

## 2018-01-25 DIAGNOSIS — F329 Major depressive disorder, single episode, unspecified: Secondary | ICD-10-CM | POA: Insufficient documentation

## 2018-01-25 DIAGNOSIS — N186 End stage renal disease: Secondary | ICD-10-CM | POA: Diagnosis not present

## 2018-01-25 DIAGNOSIS — R188 Other ascites: Principal | ICD-10-CM

## 2018-01-25 DIAGNOSIS — I8510 Secondary esophageal varices without bleeding: Secondary | ICD-10-CM | POA: Insufficient documentation

## 2018-01-25 DIAGNOSIS — K7460 Unspecified cirrhosis of liver: Secondary | ICD-10-CM

## 2018-01-25 DIAGNOSIS — Z885 Allergy status to narcotic agent status: Secondary | ICD-10-CM | POA: Diagnosis not present

## 2018-01-25 DIAGNOSIS — Z7984 Long term (current) use of oral hypoglycemic drugs: Secondary | ICD-10-CM | POA: Diagnosis not present

## 2018-01-25 DIAGNOSIS — Z880 Allergy status to penicillin: Secondary | ICD-10-CM | POA: Diagnosis not present

## 2018-01-25 DIAGNOSIS — J449 Chronic obstructive pulmonary disease, unspecified: Secondary | ICD-10-CM | POA: Insufficient documentation

## 2018-01-25 DIAGNOSIS — K3189 Other diseases of stomach and duodenum: Secondary | ICD-10-CM | POA: Diagnosis not present

## 2018-01-25 DIAGNOSIS — K766 Portal hypertension: Secondary | ICD-10-CM | POA: Insufficient documentation

## 2018-01-25 DIAGNOSIS — Z888 Allergy status to other drugs, medicaments and biological substances status: Secondary | ICD-10-CM | POA: Diagnosis not present

## 2018-01-25 DIAGNOSIS — Z8249 Family history of ischemic heart disease and other diseases of the circulatory system: Secondary | ICD-10-CM | POA: Diagnosis not present

## 2018-01-25 DIAGNOSIS — Z79899 Other long term (current) drug therapy: Secondary | ICD-10-CM | POA: Insufficient documentation

## 2018-01-25 DIAGNOSIS — Z881 Allergy status to other antibiotic agents status: Secondary | ICD-10-CM | POA: Insufficient documentation

## 2018-01-25 DIAGNOSIS — Z87891 Personal history of nicotine dependence: Secondary | ICD-10-CM | POA: Insufficient documentation

## 2018-01-25 DIAGNOSIS — E1122 Type 2 diabetes mellitus with diabetic chronic kidney disease: Secondary | ICD-10-CM | POA: Insufficient documentation

## 2018-01-25 DIAGNOSIS — I120 Hypertensive chronic kidney disease with stage 5 chronic kidney disease or end stage renal disease: Secondary | ICD-10-CM | POA: Diagnosis not present

## 2018-01-25 DIAGNOSIS — Z9981 Dependence on supplemental oxygen: Secondary | ICD-10-CM | POA: Diagnosis not present

## 2018-01-25 DIAGNOSIS — I8511 Secondary esophageal varices with bleeding: Secondary | ICD-10-CM

## 2018-01-25 DIAGNOSIS — D693 Immune thrombocytopenic purpura: Secondary | ICD-10-CM | POA: Diagnosis not present

## 2018-01-25 DIAGNOSIS — E039 Hypothyroidism, unspecified: Secondary | ICD-10-CM | POA: Insufficient documentation

## 2018-01-25 HISTORY — PX: ESOPHAGOGASTRODUODENOSCOPY (EGD) WITH PROPOFOL: SHX5813

## 2018-01-25 LAB — CBC WITH DIFFERENTIAL/PLATELET
Basophils Absolute: 0 10*3/uL (ref 0–0.1)
Basophils Relative: 0 %
Eosinophils Absolute: 0.3 10*3/uL (ref 0–0.7)
Eosinophils Relative: 5 %
HEMATOCRIT: 31.9 % — AB (ref 35.0–47.0)
HEMOGLOBIN: 10.4 g/dL — AB (ref 12.0–16.0)
LYMPHS ABS: 0.4 10*3/uL — AB (ref 1.0–3.6)
LYMPHS PCT: 6 %
MCH: 27.2 pg (ref 26.0–34.0)
MCHC: 32.6 g/dL (ref 32.0–36.0)
MCV: 83.4 fL (ref 80.0–100.0)
MONO ABS: 0.7 10*3/uL (ref 0.2–0.9)
Monocytes Relative: 11 %
Neutro Abs: 5.1 10*3/uL (ref 1.4–6.5)
Neutrophils Relative %: 78 %
Platelets: 83 10*3/uL — ABNORMAL LOW (ref 150–440)
RBC: 3.82 MIL/uL (ref 3.80–5.20)
RDW: 17.9 % — AB (ref 11.5–14.5)
WBC: 6.5 10*3/uL (ref 3.6–11.0)

## 2018-01-25 SURGERY — ESOPHAGOGASTRODUODENOSCOPY (EGD) WITH PROPOFOL
Anesthesia: General

## 2018-01-25 MED ORDER — SODIUM CHLORIDE 0.9 % IV SOLN
INTRAVENOUS | Status: DC
  Administered 2018-01-25: 1000 mL via INTRAVENOUS

## 2018-01-25 MED ORDER — ALBUMIN HUMAN 25 % IV SOLN: 25.0000 g | Freq: Once | INTRAVENOUS | Status: DC

## 2018-01-25 MED ORDER — PROPOFOL 10 MG/ML IV BOLUS
INTRAVENOUS | Status: DC | PRN
  Administered 2018-01-25 (×3): 10 mg via INTRAVENOUS

## 2018-01-25 NOTE — Anesthesia Post-op Follow-up Note (Signed)
Anesthesia QCDR form completed.        

## 2018-01-25 NOTE — Anesthesia Preprocedure Evaluation (Signed)
Anesthesia Evaluation  Patient identified by MRN, date of birth, ID band Patient awake    Reviewed: Allergy & Precautions, H&P , NPO status , Patient's Chart, lab work & pertinent test results  History of Anesthesia Complications Negative for: history of anesthetic complications  Airway Mallampati: III  TM Distance: >3 FB Neck ROM: limited    Dental  (+) Chipped, Partial Lower, Upper Dentures, Poor Dentition, Missing   Pulmonary neg shortness of breath, COPD,  COPD inhaler and oxygen dependent, former smoker,           Cardiovascular Exercise Tolerance: Good hypertension, (-) angina(-) Past MI and (-) DOE      Neuro/Psych PSYCHIATRIC DISORDERS Depression negative neurological ROS     GI/Hepatic Neg liver ROS, GERD  Medicated and Controlled,  Endo/Other  diabetes, Type 2Hypothyroidism   Renal/GU ESRFRenal disease  negative genitourinary   Musculoskeletal   Abdominal   Peds  Hematology negative hematology ROS (+)   Anesthesia Other Findings Patient endorses baseline vocal hoarseness   Past Medical History: No date: Cirrhosis of liver not due to alcohol (HCC) No date: COPD (chronic obstructive pulmonary disease) (HCC) No date: Depression No date: Diabetes (HCC) No date: GERD (gastroesophageal reflux disease) No date: Hypothyroidism No date: ITP (idiopathic thrombocytopenic purpura) No date: Nephrolithiasis  Past Surgical History: No date: CATARACT EXTRACTION No date: LUMBAR DISC SURGERY No date: TONSILLECTOMY AND ADENOIDECTOMY  BMI    Body Mass Index:  20.63 kg/m      Reproductive/Obstetrics negative OB ROS                             Anesthesia Physical  Anesthesia Plan  ASA: IV  Anesthesia Plan: General   Post-op Pain Management:    Induction: Intravenous  PONV Risk Score and Plan: Propofol infusion and TIVA  Airway Management Planned: Natural Airway and Nasal  Cannula  Additional Equipment:   Intra-op Plan:   Post-operative Plan:   Informed Consent: I have reviewed the patients History and Physical, chart, labs and discussed the procedure including the risks, benefits and alternatives for the proposed anesthesia with the patient or authorized representative who has indicated his/her understanding and acceptance.   Dental Advisory Given  Plan Discussed with: Anesthesiologist, CRNA and Surgeon  Anesthesia Plan Comments: (Patient consented for risks of anesthesia including but not limited to:  - adverse reactions to medications - risk of intubation if required - damage to teeth, lips or other oral mucosa - sore throat or hoarseness - Damage to heart, brain, lungs or loss of life  Patient voiced understanding.)        Anesthesia Quick Evaluation

## 2018-01-25 NOTE — Transfer of Care (Signed)
Immediate Anesthesia Transfer of Care Note  Patient: Angela Austin  Procedure(s) Performed: ESOPHAGOGASTRODUODENOSCOPY (EGD) WITH PROPOFOL (N/A )  Patient Location: PACU and Endoscopy Unit  Anesthesia Type:General  Level of Consciousness: awake, alert  and oriented  Airway & Oxygen Therapy: Patient Spontanous Breathing and Patient connected to nasal cannula oxygen  Post-op Assessment: Report given to RN and Post -op Vital signs reviewed and stable  Post vital signs: Reviewed and stable  Last Vitals:  Vitals Value Taken Time  BP 109/61 01/25/2018 10:32 AM  Temp    Pulse 64 01/25/2018 10:33 AM  Resp 19 01/25/2018 10:33 AM  SpO2 100 % 01/25/2018 10:33 AM  Vitals shown include unvalidated device data.  Last Pain:  Vitals:   01/25/18 0923  TempSrc: Tympanic  PainSc: 0-No pain         Complications: No apparent anesthesia complications

## 2018-01-25 NOTE — Op Note (Addendum)
[facility] Patient Name: Angela Austin Procedure Date: 01/25/2018 9:22 AM MRN: [id number] Account #: [id number] Date of Birth: 02/16/1946 Admit Type: Outpatient Age: 72 Room: [facility] ENDO ROOM 4 Gender: Female Note Status: Finalized Procedure:            Upper GI endoscopy Indications:          Esophageal varices, Follow-up of esophageal varices,                        For therapy of esophageal varices Providers:            Lin Landsman MD, MD Referring MD:         Caprice Renshaw MD (Referring MD) Medicines:            Propofol per Anesthesia Complications:        No immediate complications. Estimated blood loss:                        Minimal. Procedure:            Pre-Anesthesia Assessment:                       - Prior to the procedure, a History and Physical was                        performed, and patient medications and allergies were                        reviewed. The patient is competent. The risks and                        benefits of the procedure and the sedation options and                        risks were discussed with the patient. All questions                        were answered and informed consent was obtained.                        Patient identification and proposed procedure were                        verified by the physician, the nurse, the                        anesthesiologist, the anesthetist and the technician in                        the pre-procedure area in the procedure room in the                        endoscopy suite. Mental Status Examination: alert and                        oriented. Airway Examination: normal oropharyngeal                        airway and neck mobility. Respiratory Examination:  clear to auscultation. CV Examination: normal.                        Prophylactic Antibiotics: The patient does not require                        prophylactic antibiotics.  Prior Anticoagulants: The                        patient has taken no previous anticoagulant or                        antiplatelet agents. ASA Grade Assessment: IV - A                        patient with severe systemic disease that is a constant                        threat to life. After reviewing the risks and benefits,                        the patient was deemed in satisfactory condition to                        undergo the procedure. The anesthesia plan was to use                        monitored anesthesia care (MAC). Immediately prior to                        administration of medications, the patient was                        re-assessed for adequacy to receive sedatives. The                        heart rate, respiratory rate, oxygen saturations, blood                        pressure, adequacy of pulmonary ventilation, and                        response to care were monitored throughout the                        procedure. The physical status of the patient was                        re-assessed after the procedure.                       After obtaining informed consent, the endoscope was                        passed under direct vision. Throughout the procedure,                        the patient's blood pressure, pulse, and oxygen  saturations were monitored continuously. The Endoscope                        was introduced through the mouth, and advanced to the                        second part of duodenum. The upper GI endoscopy was                        accomplished without difficulty. The patient tolerated                        the procedure fairly well. Findings:      The duodenal bulb and second portion of the duodenum were normal.       Biopsies were taken with a cold forceps for histology.      Mild portal hypertensive gastropathy was found in the entire examined       stomach.      Large (> 5 mm) varices were found in the lower third  of the esophagus.       Three bands were successfully placed with incomplete eradication of       varices. There was no bleeding during, and at the end, of the procedure.       Estimated blood loss was minimal. Impression:           - Normal duodenal bulb and second portion of the                        duodenum. Biopsied.                       - Portal hypertensive gastropathy.                       - Large (> 5 mm) esophageal varices. Incompletely                        eradicated. Banded. Recommendation:       - Discharge patient to home (with escort).                       - Low sodium diet today.                       - Continue present medications.                       - Repeat upper endoscopy in 4 weeks for endoscopic band                        ligation.                       - Return to my office as previously scheduled. Procedure Code(s):    --- Professional ---                       [phone], Esophagogastroduodenoscopy, flexible, transoral;                        with band ligation of esophageal/gastric varices  92010, Esophagogastroduodenoscopy, flexible, transoral;                        with biopsy, single or multiple Diagnosis Code(s):    --- Professional ---                       K76.6, Portal hypertension                       K31.89, Other diseases of stomach and duodenum                       I85.00, Esophageal varices without bleeding CPT copyright 2017 American Medical Association. All rights reserved. The codes documented in this report are preliminary and upon coder review may  be revised to meet current compliance requirements. Dr. Ulyess Mort Lin Landsman MD, MD 01/25/2018 10:30:15 AM This report has been signed electronically. Number of Addenda: 0 Note Initiated On: 01/25/2018 9:22 AM      [facility]

## 2018-01-25 NOTE — H&P (Signed)
Arlyss Repressohini R Vanga, MD [address]  [address]  [place], [place] [address]  Main: [phone]  Fax: [phone] Pager: [phone]  Primary Care Physician:  Kandyce RudBabaoff, Marcus, MD Primary Gastroenterologist:  Dr. Arlyss Repressohini R Vanga  Pre-Procedure History & Physical: HPI:  Angela DowJackie L Farino is a [age] y.o. female is here for an endoscopy.   Past Medical History:  Diagnosis Date  . Cirrhosis of liver not due to alcohol (HCC)   . COPD (chronic obstructive pulmonary disease) (HCC)   . Depression   . Diabetes (HCC)   . GERD (gastroesophageal reflux disease)   . Hypothyroidism   . ITP (idiopathic thrombocytopenic purpura)   . Nephrolithiasis     Past Surgical History:  Procedure Laterality Date  . CATARACT EXTRACTION    . ESOPHAGOGASTRODUODENOSCOPY N/A 12/31/2017   Procedure: ESOPHAGOGASTRODUODENOSCOPY (EGD);  Surgeon: Toney ReilVanga, Rohini Reddy, MD;  [facility];  Service: Gastroenterology;  Laterality: N/A;  . LUMBAR DISC SURGERY    . TONSILLECTOMY AND ADENOIDECTOMY      Prior to Admission medications   Medication Sig Start Date End Date Taking? Authorizing Provider  albumin human 25 % bottle Give 25g of albumin per 4 liters of fluid removed max 50g 01/15/18  Yes Vanga, Loel Dubonnetohini Reddy, MD  albuterol (ACCUNEB) 1.25 MG/3ML nebulizer solution Take 1 ampule by nebulization every 6 (six) hours as needed for wheezing.   Yes [provider]  albuterol (PROVENTIL HFA) 108 (90 Base) MCG/ACT inhaler Inhale 1 puff into the lungs every 4 (four) hours as needed.    Yes [provider]  FLUoxetine (PROZAC) 20 MG capsule  12/08/17  Yes [provider]  FLUoxetine (PROZAC) 20 MG tablet Take 20 mg by mouth daily.   Yes [provider]  fluticasone (FLONASE) 50 MCG/ACT nasal spray PLACE 2 SPRAYS INTO BOTH NOSTRILS ONCE DAILY AS NEEDED FOR ALLERGIES 11/27/15  Yes [provider]  furosemide (LASIX) 20 MG tablet  01/08/18  Yes [provider]    glyBURIDE (DIABETA) 2.5 MG tablet Take 1 tablet (2.5 mg total) by mouth daily with breakfast. 01/02/18 01/02/19 Yes Mody, Patricia PesaSital, MD  glyBURIDE micronized (GLYNASE) 6 MG tablet  12/06/17  Yes [provider]  levothyroxine (SYNTHROID, LEVOTHROID) 75 MCG tablet Take 75 mcg by mouth daily before breakfast.  01/07/16  Yes [provider]  loratadine (CLARITIN) 10 MG tablet Take 10 mg by mouth daily as needed for allergies.   Yes [provider]  nadolol (CORGARD) 20 MG tablet Take 1 tablet (20 mg total) by mouth daily. 01/02/18 01/02/19 Yes Mody, Patricia PesaSital, MD  pantoprazole (PROTONIX) 40 MG tablet Take 40 mg by mouth 2 (two) times daily.   Yes [provider]  protein supplement shake (PREMIER PROTEIN) LIQD Take 325 mLs (11 oz total) by mouth 2 (two) times daily between meals. 01/02/18 02/01/18 Yes Mody, Patricia PesaSital, MD  spironolactone (ALDACTONE) 100 MG tablet Take 1 tablet (100 mg total) by mouth daily. 01/03/18  Yes Adrian SaranMody, Sital, MD  spironolactone (ALDACTONE) 25 MG tablet  01/08/18  Yes [provider]  furosemide (LASIX) 40 MG tablet Take 1 tablet (40 mg total) by mouth daily. Patient not taking: Reported on 01/15/2018 01/03/18   Adrian SaranMody, Sital, MD    Allergies as of 01/11/2018 - Review Complete 12/31/2017  Allergen Reaction Noted  . Metformin Diarrhea 12/13/2013  . Amoxicillin-pot clavulanate Other (See Comments) 12/13/2013  . Codeine  07/03/2014  . Erythromycin  07/03/2014  . Erythromycin ethylsuccinate  12/13/2013  . Levofloxacin Other (  See Comments) 11/24/2015  . Metformin and related  07/14/2014  . Morphine Other (See Comments) and Nausea And Vomiting 12/13/2013  . Morphine and related  07/03/2014  . Morphine sulfate er beads Other (See Comments) 02/10/2015  . Omeprazole Other (See Comments) 12/13/2013  . Spiriva handihaler [tiotropium bromide monohydrate] Swelling 07/03/2014  . Statins Other (See Comments) 12/13/2013  . Tetracyclines & related  07/03/2014  . Ace  inhibitors Rash 12/13/2013    Family History  Problem Relation Age of Onset  . Alzheimer's disease Mother   . COPD Mother   . Cancer Mother   . COPD Father   . Diabetes Mellitus II Father   . Heart attack Father     Social History   Socioeconomic History  . Marital status: Widowed    Spouse name: Not on file  . Number of children: Not on file  . Years of education: Not on file  . Highest education level: Not on file  Occupational History  . Not on file  Social Needs  . Financial resource strain: Not on file  . Food insecurity:    Worry: Not on file    Inability: Not on file  . Transportation needs:    Medical: Not on file    Non-medical: Not on file  Tobacco Use  . Smoking status: Former Smoker    Years: 25.00    Types: Cigarettes  . Smokeless tobacco: Never Used  Substance and Sexual Activity  . Alcohol use: No    Alcohol/week: 0.0 oz  . Drug use: No  . Sexual activity: Not on file  Lifestyle  . Physical activity:    Days per week: Not on file    Minutes per session: Not on file  . Stress: Not on file  Relationships  . Social connections:    Talks on phone: Not on file    Gets together: Not on file    Attends religious service: Not on file    Active member of club or organization: Not on file    Attends meetings of clubs or organizations: Not on file    Relationship status: Not on file  . Intimate partner violence:    Fear of current or ex partner: Not on file    Emotionally abused: Not on file    Physically abused: Not on file    Forced sexual activity: Not on file  Other Topics Concern  . Not on file  Social History Narrative  . Not on file    Review of Systems: See HPI, otherwise negative ROS  Physical Exam: BP (!) 129/47   Pulse 87   Temp (!) 97.3 F (36.3 C) (Tympanic)   Resp 20   Ht 5\' 5"  (1.651 m)   Wt 107 lb (48.5 kg)   SpO2 (!) 87%   BMI 17.81 kg/m  General:   Alert,  pleasant and cooperative in NAD Head:  Normocephalic and  atraumatic. Neck:  Supple; no masses or thyromegaly. Lungs:  Clear throughout to auscultation.    Heart:  Regular rate and rhythm. Abdomen:  Soft, nontender and grossly distended. Normal bowel sounds, without guarding, and without rebound.   Neurologic:  Alert and  oriented x4;  grossly normal neurologically.  Impression/Plan: Angela Austin is here for an endoscopy to be performed for variceal banding  Risks, benefits, limitations, and alternatives regarding  endoscopy have been reviewed with the patient.  Questions have been answered.  All parties agreeable.   Lannette Donath, MD  01/25/2018,  10:01 AM

## 2018-01-25 NOTE — Anesthesia Postprocedure Evaluation (Signed)
Anesthesia Post Note  Patient: Angela Austin  Procedure(s) Performed: ESOPHAGOGASTRODUODENOSCOPY (EGD) WITH PROPOFOL (N/A )  Patient location during evaluation: Endoscopy Anesthesia Type: General Level of consciousness: awake and alert Pain management: pain level controlled Vital Signs Assessment: post-procedure vital signs reviewed and stable Respiratory status: spontaneous breathing, nonlabored ventilation, respiratory function stable and patient connected to nasal cannula oxygen Cardiovascular status: blood pressure returned to baseline and stable Postop Assessment: no apparent nausea or vomiting Anesthetic complications: no     Last Vitals:  Vitals:   01/25/18 1050 01/25/18 1100  BP: (!) 128/44 (!) 141/55  Pulse: 65 63  Resp: 18 17  Temp:    SpO2: 100% 100%    Last Pain:  Vitals:   01/25/18 1030  TempSrc: Tympanic  PainSc:                  Lenard SimmerAndrew Inika Bellanger

## 2018-01-25 NOTE — Discharge Instructions (Signed)
Paracentesis, Care After °Refer to this sheet in the next few weeks. These instructions provide you with information about caring for yourself after your procedure. Your health care provider may also give you more specific instructions. Your treatment has been planned according to current medical practices, but problems sometimes occur. Call your health care provider if you have any problems or questions after your procedure. °What can I expect after the procedure? °After your procedure, it is common to have a small amount of clear fluid coming from the puncture site. °Follow these instructions at home: °· Return to your normal activities as told by your health care provider. Ask your health care provider what activities are safe for you. °· Take over-the-counter and prescription medicines only as told by your health care provider. °· Do not take baths, swim, or use a hot tub until your health care provider approves. °· Follow instructions from your health care provider about: °? How to take care of your puncture site. °? When and how you should change your bandage (dressing). °? When you should remove your dressing. °· Check your puncture area every day signs of infection. Watch for: °? Redness, swelling, or pain. °? Fluid, blood, or pus. °· Keep all follow-up visits as told by your health care provider. This is important. °Contact a health care provider if: °· You have redness, swelling, or pain at your puncture site. °· You start to have more clear fluid coming from your puncture site. °· You have blood or pus coming from your puncture site. °· You have chills. °· You have a fever. °Get help right away if: °· You develop chest pain or shortness of breath. °· You develop increasing pain, discomfort, or swelling in your abdomen. °· You feel dizzy or light-headed or you pass out. °This information is not intended to replace advice given to you by your health care provider. Make sure you discuss any questions you  have with your health care provider. °Document Released: 12/02/2014 Document Revised: 12/24/2015 Document Reviewed: 09/30/2014 °Elsevier Interactive Patient Education © 2018 [employer] ° °

## 2018-01-26 ENCOUNTER — Ambulatory Visit
Admission: RE | Admit: 2018-01-26 | Discharge: 2018-01-26 | Disposition: A | Discharge status: Home / Self Care | Payer: Medicare Other | Source: Ambulatory Visit | Attending: Gastroenterology | Admitting: Gastroenterology

## 2018-01-26 ENCOUNTER — Other Ambulatory Visit: Payer: Self-pay

## 2018-01-26 ENCOUNTER — Encounter: Payer: Self-pay | Admitting: Gastroenterology

## 2018-01-26 DIAGNOSIS — K7460 Unspecified cirrhosis of liver: Secondary | ICD-10-CM | POA: Diagnosis not present

## 2018-01-26 DIAGNOSIS — R188 Other ascites: Secondary | ICD-10-CM | POA: Insufficient documentation

## 2018-01-26 NOTE — Progress Notes (Unsigned)
ef

## 2018-01-26 NOTE — Procedures (Signed)
Pre Procedural Dx: Symptomatic Ascites Post Procedural Dx: Same  Successful US guided paracentesis yielding 3 L of serous ascitic fluid. Sample sent to laboratory as requested.  EBL: None  Complications: None immediate  Jay Vergil Burby, MD Pager #: [phone]   

## 2018-01-31 ENCOUNTER — Other Ambulatory Visit: Payer: Self-pay | Admitting: Gastroenterology

## 2018-01-31 DIAGNOSIS — R188 Other ascites: Secondary | ICD-10-CM

## 2018-01-31 DIAGNOSIS — K7460 Unspecified cirrhosis of liver: Secondary | ICD-10-CM

## 2018-02-13 ENCOUNTER — Other Ambulatory Visit: Payer: Self-pay

## 2018-02-13 DIAGNOSIS — K7460 Unspecified cirrhosis of liver: Principal | ICD-10-CM

## 2018-02-13 DIAGNOSIS — I8510 Secondary esophageal varices without bleeding: Secondary | ICD-10-CM

## 2018-02-14 ENCOUNTER — Encounter: Payer: Self-pay | Admitting: Gastroenterology

## 2018-02-14 ENCOUNTER — Ambulatory Visit (INDEPENDENT_AMBULATORY_CARE_PROVIDER_SITE_OTHER): Payer: Medicare Other | Admitting: Gastroenterology

## 2018-02-14 ENCOUNTER — Other Ambulatory Visit: Payer: Self-pay

## 2018-02-14 VITALS — BP 113/67 | HR 63 | Resp 16 | Wt 99.5 lb

## 2018-02-14 DIAGNOSIS — K7290 Hepatic failure, unspecified without coma: Secondary | ICD-10-CM | POA: Diagnosis not present

## 2018-02-14 DIAGNOSIS — I8510 Secondary esophageal varices without bleeding: Secondary | ICD-10-CM | POA: Diagnosis not present

## 2018-02-14 DIAGNOSIS — R188 Other ascites: Secondary | ICD-10-CM | POA: Diagnosis not present

## 2018-02-14 DIAGNOSIS — K7460 Unspecified cirrhosis of liver: Secondary | ICD-10-CM

## 2018-02-14 NOTE — Progress Notes (Signed)
Arlyss Repress, MD [address]  [address]  [place], [place] [address]  Main: [phone]  Fax: [phone]    Gastroenterology Consultation  Referring Provider:     Kandyce Rud, MD Primary Care Physician:  Kandyce Rud, MD Primary Gastroenterologist:  Dr. Arlyss Repress Reason for Consultation:     Decompensated cirrhosis        HPI:   Angela Austin is a 72 y.o. female referred by Dr. Kandyce Rud, MD  for consultation & management of  decompensated cirrhosis. She was admitted to [facility] in early June 2019 secondary to 2 months history of progressively worsening abdominal distention and swelling of legs, her symptoms actually started in 09/2017, she also noticed black tarry stools for one day before presentation to the ER. She used to see Dr. Lutricia Feil until 2015. She was on furosemide 20 mg twice daily and spironolactone 50 mg daily. She reports that she is diagnosed with cirrhosis about 5 years ago when she presented with GI bleed. Her viral hepatitis panel was negative, anti-smooth muscle antibodies and antimitochondrial antibodies were negative. She thinks that her cirrhosis is secondary to long-term metformin use. She also has COPD and oxygen dependent. She had an ultrasound on admission and was found to have cirrhosis with moderate ascites and underwent paracentesis with 7.4 L of clear fluid removed. There was no evidence of SBP. she underwent EGD and was found to have large esophageal varices with stigmata of recent bleeding and underwent ligation. She was discharged home on furosemide, spironolactone, nadolol. Her creatinine was slowly rising at the time of discharge  Follow-up visit 01/15/2018: She feels weak, abdomen is distended, swelling of legs is better. She is accompanied by her daughter-in-law who helps her with medical decision making. She is in wheelchair and on home O2, currently at 3 L. She denies melena, hematemesis, rectal  bleeding. She is taking lactulose syrup and having 2-3 bowel movements daily. She is taking furosemide 40 mg daily, spironolactone 100 mg daily, and nadolol 20 mg daily. Her father who is 95 years old is actually helping her at home. Patient is concerned about help not being available anymore to take care of her at home. She does consume canned foods as she has hard time cooking food at home due to her physical condition.  Follow-up visits 02/14/2018 She underwent EGD with variceal ligation. She also underwent therapeutic paracentesis 3 times last month, there was no evidence of SBP. She is seeing interventional radiology for evaluation of TIPS tomorrow. She fell on her left hip few days back. Pain is getting better, denies any bruise in the area from the fall. She reports petechiae and ecchymosis in her upper extremities and lower extremities and worried if her platelets are low. Petechia in her lower extremities resulted from shaving. She does have abdominal distention from ascites but not tense. She does not think she will need paracentesis immediately. She denies rectal bleeding, black stools, hematemesis. She does report early satiety and nausea after each meal. She denies fever, chills, vomiting, abdominal pain. She is waiting to be seen by palliative care  NSAIDs: none  Antiplts/Anticoagulants/Anti thrombotics: none  GI Procedures: she had EGD on 05/23/2014 at [facility] Report not available EGD 12/31/17 Findings: - Normal duodenal bulb and second portion of the duodenum. - Portal hypertensive gastropathy. - Recently bleeding large (> 5 mm) esophageal varices. Incompletely eradicated. Bandedx4. - No specimens collected.  EGD 01/25/2018 Large esophageal varices, banded x 3  She never had a colonoscopy   Past Medical History:  Diagnosis Date  . Cirrhosis of liver not due to alcohol (HCC)   . COPD (chronic obstructive pulmonary disease) (HCC)   . Depression   . Diabetes (HCC)   . GERD  (gastroesophageal reflux disease)   . Hypothyroidism   . ITP (idiopathic thrombocytopenic purpura)   . Nephrolithiasis     Past Surgical History:  Procedure Laterality Date  . CATARACT EXTRACTION    . ESOPHAGOGASTRODUODENOSCOPY N/A 12/31/2017   Procedure: ESOPHAGOGASTRODUODENOSCOPY (EGD);  Surgeon: Toney Reil, MD;  Location: [facility] ENDOSCOPY;  Service: Gastroenterology;  Laterality: N/A;  . ESOPHAGOGASTRODUODENOSCOPY (EGD) WITH PROPOFOL N/A 01/25/2018   Procedure: ESOPHAGOGASTRODUODENOSCOPY (EGD) WITH PROPOFOL;  Surgeon: Toney Reil, MD;  Location: [facility] END[facility]COPY;  Service: Gastroenterology;  Laterality: N/A;  . LUMBAR DISC SURGERY    . TONSILLECTOMY AND ADENOIDECTOMY      Current Outpatient Medications:  .  albumin human 25 % bottle, Give 25g of albumin per 4 liters of fluid removed max 50g, Disp: 50 mL, Rfl: 0 .  albuterol (ACCUNEB) 1.25 MG/3ML nebulizer solution, Take 1 ampule by nebulization every 6 (six) hours as needed for wheezing., Disp: , Rfl:  .  albuterol (PROVENTIL HFA) 108 (90 Base) MCG/ACT inhaler, Inhale 1 puff into the lungs every 4 (four) hours as needed. , Disp: , Rfl:  .  FLUoxetine (PROZAC) 20 MG capsule, , Disp: , Rfl:  .  fluticasone (FLONASE) 50 MCG/ACT nasal spray, PLACE 2 SPRAYS INTO BOTH NOSTRILS ONCE DAILY AS NEEDED FOR ALLERGIES, Disp: , Rfl:  .  furosemide (LASIX) 40 MG tablet, Take 1 tablet (40 mg total) by mouth daily., Disp: 30 tablet, Rfl: 0 .  glyBURIDE (DIABETA) 2.5 MG tablet, Take 1 tablet (2.5 mg total) by mouth daily with breakfast., Disp: 30 tablet, Rfl: 11 .  levothyroxine (SYNTHROID, LEVOTHROID) 75 MCG tablet, Take 75 mcg by mouth daily before breakfast. , Disp: , Rfl:  .  loratadine (CLARITIN) 10 MG tablet, Take 10 mg by mouth daily as needed for allergies., Disp: , Rfl:  .  nadolol (CORGARD) 20 MG tablet, Take 1 tablet (20 mg total) by mouth daily., Disp: 30 tablet, Rfl: 11 .  pantoprazole (PROTONIX) 40 MG tablet, Take 40 mg by mouth  2 (two) times daily., Disp: , Rfl:  .  spironolactone (ALDACTONE) 100 MG tablet, Take 1 tablet (100 mg total) by mouth daily., Disp: 30 tablet, Rfl: 0 .  FLUoxetine (PROZAC) 20 MG tablet, Take 20 mg by mouth daily., Disp: , Rfl:  .  furosemide (LASIX) 20 MG tablet, , Disp: , Rfl:  .  glyBURIDE micronized (GLYNASE) 6 MG tablet, , Disp: , Rfl:  .  spironolactone (ALDACTONE) 25 MG tablet, , Disp: , Rfl:   Current Facility-Administered Medications:  .  albumin human 25 % solution 25 g, 25 g, Intravenous, Once, Shakiara Lukic, Loel Dubonnet, MD   Family History  Problem Relation Age of Onset  . Alzheimer's disease Mother   . COPD Mother   . Cancer Mother   . COPD Father   . Diabetes Mellitus II Father   . Heart attack Father      Social History   Tobacco Use  . Smoking status: Former Smoker    Years: 25.00    Types: Cigarettes  . Smokeless tobacco: Never Used  Substance Use Topics  . Alcohol use: No    Alcohol/week: 0.0 oz  . Drug use: No    Allergies as of 02/14/2018 -  Review Complete 02/14/2018  Allergen Reaction Noted  . Metformin Diarrhea 12/13/2013  . Amoxicillin-pot clavulanate Other (See Comments) 12/13/2013  . Codeine  07/03/2014  . Erythromycin  07/03/2014  . Erythromycin ethylsuccinate  12/13/2013  . Levofloxacin Other (See Comments) 11/24/2015  . Metformin and related  07/14/2014  . Morphine Other (See Comments) and Nausea And Vomiting 12/13/2013  . Morphine and related  07/03/2014  . Morphine sulfate er beads Other (See Comments) 02/10/2015  . Omeprazole Other (See Comments) 12/13/2013  . Spiriva handihaler [tiotropium bromide monohydrate] Swelling 07/03/2014  . Statins Other (See Comments) 12/13/2013  . Tetracyclines & related  07/03/2014  . Ace inhibitors Rash 12/13/2013    Review of Systems:    All systems reviewed and negative except where noted in HPI.   Physical Exam:  BP 113/67 (BP Location: Left Arm, Patient Position: Sitting, Cuff Size: Small)   Pulse  63   Resp 16   Wt 99 lb 8 oz (45.1 kg)   BMI 16.56 kg/m  No LMP recorded. Patient is postmenopausal.  General:   Alert,  Ill-appearing, cachectic, in mild respiratory distress due to COPD Head:  Normocephalic and atraumatic, bitemporal wasting. Eyes:  Sclera clear, no icterus. Conjunctiva pink. Ears:  Normal auditory acuity. Nose:  No deformity, discharge, or lesions. Mouth:  No deformity or lesions,oropharynx pink & moist. Neck:  Supple; no masses or thyromegaly. Lungs:  Respirations even and unlabored, shallow breathing.  Clear throughout to auscultation.   No wheezes, crackles, or rhonchi. No acute distress. Heart:  Regular rate and rhythm; no murmurs, clicks, rubs, or gallops. Abdomen:  Normal bowel sounds. Soft, non-tender, diffusely distended, dull to percussion.  No guarding or rebound tenderness.   Rectal: Not performed Msk: significant muscle wasting of all extremities, Symmetrical without gross deformities.  Pulses:  Normal pulses noted. Extremities:  No clubbing, 1+edema.  No cyanosis. Neurologic:  Alert and oriented x3;  grossly normal neurologically. Skin:  bruising in bilateral upper extremities, No jaundice. Lymph Nodes:  No significant cervical adenopathy. Psych:  Alert and cooperative. Normal mood and affect.  Imaging Studies: reviewed  Assessment and Plan:   GUILA OWENSBY is a 72 y.o. Caucasian female with history of diabetes, hypertension, end stage COPD on home O2, decompensated cirrhosis with ascites needing serial therapeutic paracentesis, history of upper GI bleed from esophageal varices, status post EGD in 12/2017 with EVL 4. 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 There  was no evidence of SBP. SAAG>1.1 On furosemide 40 mg daily, spironolactone 100 mg daily. Therapeutic paracentesis as needed with administration of IV albumin post-paracentesis. Referred to interventional radiology for evaluation of TIPS Varices: large esophageal varices status post EVL, portal hypertensive gastropathy Repeat EGD on 02/22/2018, continue nadolol 20mg  daily HCC screening: recent ultrasound in 12/2017 with no evidence of liver lesions, check AFP Recheck ultrasound in 6 months Highly encouraged high protein diet and low-sodium diet  Liver transplant evaluation: not a candidate secondary to end-stage COPD which puts her at high risk for short-term mortality unless she gets both liver and lung transplant simultaneously. She is also severely malnourished which makes her prognosis even worse. She will be evaluated for TIPS placement as a palliative measure has she's needing serial therapeutic paracentesis. She is waiting to be seen by palliative  care   Follow up in 4-6 weeks   Arlyss Repressohini R Jahayra Mazo, MD

## 2018-02-15 ENCOUNTER — Encounter: Payer: Self-pay | Admitting: Radiology

## 2018-02-15 ENCOUNTER — Ambulatory Visit
Admission: RE | Admit: 2018-02-15 | Discharge: 2018-02-15 | Disposition: A | Discharge status: Home / Self Care | Payer: Medicare Other | Source: Ambulatory Visit | Attending: Gastroenterology | Admitting: Gastroenterology

## 2018-02-15 ENCOUNTER — Other Ambulatory Visit: Payer: Self-pay

## 2018-02-15 DIAGNOSIS — R188 Other ascites: Secondary | ICD-10-CM

## 2018-02-15 DIAGNOSIS — K7460 Unspecified cirrhosis of liver: Secondary | ICD-10-CM

## 2018-02-15 HISTORY — PX: IR RADIOLOGIST EVAL & MGMT: IMG5224

## 2018-02-15 MED ORDER — ALBUMIN HUMAN 25 % IV SOLN: 25.0000 g | Freq: Once | INTRAVENOUS | Status: DC

## 2018-02-15 NOTE — Consult Note (Signed)
Chief Complaint: Recurrence of traumatic ascites  Referring Physician(s): Toney Reil  History of Present Illness: Angela Austin is a 72 y.o. female with complex past medical history significant for diabetes, hypothyroidism, nephrolithiasis, COPD (on continuous oxygen supplementation) and cirrhosis of the liver (presumed to be secondary to [place] cirrhosis or medication induced cirrhosis) who presents to the interventional radiology clinic for evaluation for potential TIPS creation due to recurrent symptomatic intra-abdominal ascites.  The patient is accompanied by her brother though serves as her own historian.  Patient has undergone 3 large-volume paracenteses beginning in early June with most recent paracentesis performed 01/20/2018 yielding 3 L of peritoneal fluid.  Patient was noted to have enlarged esophageal varices on contrast-enhanced abdominal CT performed 06/26/2014 though has not had interval cross-sectional imaging of her abdomen.  Patient denies hematemesis, bloody or melanotic stools.  Patient denies yellowing of the skin or eyes.  No change in mental status.  From a functional status, the patient is severely debilitated.  She ambulates only short distances (approximately 15 feet) with a walker and becomes extremely fatigued with simple activities of daily living.  During today's appointment, she is noticeably short of breath with conversation and demonstrates pursed lip breathing.    Past Medical History:  Diagnosis Date  . Cirrhosis of liver not due to alcohol (HCC)   . COPD (chronic obstructive pulmonary disease) (HCC)   . Depression   . Diabetes (HCC)   . GERD (gastroesophageal reflux disease)   . Hypothyroidism   . ITP (idiopathic thrombocytopenic purpura)   . Nephrolithiasis     Past Surgical History:  Procedure Laterality Date  . CATARACT EXTRACTION    . ESOPHAGOGASTRODUODENOSCOPY N/A 12/31/2017   Procedure: ESOPHAGOGASTRODUODENOSCOPY (EGD);   Surgeon: Toney Reil, MD;  Location: [facility] ENDOSCOPY;  Service: Gastroenterology;  Laterality: N/A;  . ESOPHAGOGASTRODUODENOSCOPY (EGD) WITH PROPOFOL N/A 01/25/2018   Procedure: ESOPHAGOGASTRODUODENOSCOPY (EGD) WITH PROPOFOL;  Surgeon: Toney Reil, MD;  Location: [facility] ENDOSCOPY;  Service: Gastroenterology;  Laterality: N/A;  . IR RADIOLOGIST EVAL & MGMT  02/15/2018  . LUMBAR DISC SURGERY    . TONSILLECTOMY AND ADENOIDECTOMY      Allergies: Metformin; Amoxicillin-pot clavulanate; Codeine; Erythromycin; Erythromycin ethylsuccinate; Levofloxacin; Metformin and related; Morphine; Morphine and related; Morphine sulfate er beads; Omeprazole; Spiriva handihaler [tiotropium bromide monohydrate]; Statins; Tetracyclines & related; and Ace inhibitors  Medications: Prior to Admission medications   Medication Sig Start Date End Date Taking? Authorizing Provider  albuterol (ACCUNEB) 1.25 MG/3ML nebulizer solution Take 1 ampule by nebulization every 6 (six) hours as needed for wheezing.   Yes [provider]  albuterol (PROVENTIL HFA) 108 (90 Base) MCG/ACT inhaler Inhale 1 puff into the lungs every 4 (four) hours as needed.    Yes [provider]  FLUoxetine (PROZAC) 20 MG tablet Take 20 mg by mouth daily.   Yes [provider]  fluticasone (FLONASE) 50 MCG/ACT nasal spray PLACE 2 SPRAYS INTO BOTH NOSTRILS ONCE DAILY AS NEEDED FOR ALLERGIES 11/27/15  Yes [provider]  furosemide (LASIX) 40 MG tablet Take 1 tablet (40 mg total) by mouth daily. 01/03/18  Yes Adrian Saran, MD  glyBURIDE (DIABETA) 2.5 MG tablet Take 1 tablet (2.5 mg total) by mouth daily with breakfast. 01/02/18 01/02/19 Yes Mody, Patricia Pesa, MD  levothyroxine (SYNTHROID, LEVOTHROID) 75 MCG tablet Take 75 mcg by mouth daily before breakfast.  01/07/16  Yes [provider]  loratadine (CLARITIN) 10 MG tablet Take 10 mg by mouth daily as needed for allergies.  Yes [provider]  nadolol  (CORGARD) 20 MG tablet Take 1 tablet (20 mg total) by mouth daily. 01/02/18 01/02/19 Yes Mody, Patricia Pesa, MD  pantoprazole (PROTONIX) 40 MG tablet Take 40 mg by mouth 2 (two) times daily.   Yes [provider]  spironolactone (ALDACTONE) 100 MG tablet Take 1 tablet (100 mg total) by mouth daily. 01/03/18  Yes Adrian Saran, MD  albumin human 25 % bottle Give 25g of albumin per 4 liters of fluid removed max 50g 01/15/18   Vanga, Loel Dubonnet, MD     Family History  Problem Relation Age of Onset  . Alzheimer's disease Mother   . COPD Mother   . Cancer Mother   . COPD Father   . Diabetes Mellitus II Father   . Heart attack Father     Social History   Socioeconomic History  . Marital status: Widowed    Spouse name: Not on file  . Number of children: Not on file  . Years of education: Not on file  . Highest education level: Not on file  Occupational History  . Not on file  Social Needs  . Financial resource strain: Not on file  . Food insecurity:    Worry: Not on file    Inability: Not on file  . Transportation needs:    Medical: Not on file    Non-medical: Not on file  Tobacco Use  . Smoking status: Former Smoker    Years: 25.00    Types: Cigarettes  . Smokeless tobacco: Never Used  Substance and Sexual Activity  . Alcohol use: No    Alcohol/week: 0.0 oz  . Drug use: No  . Sexual activity: Not on file  Lifestyle  . Physical activity:    Days per week: Not on file    Minutes per session: Not on file  . Stress: Not on file  Relationships  . Social connections:    Talks on phone: Not on file    Gets together: Not on file    Attends religious service: Not on file    Active member of club or organization: Not on file    Attends meetings of clubs or organizations: Not on file    Relationship status: Not on file  Other Topics Concern  . Not on file  Social History Narrative  . Not on file    ECOG Status: 3 - Symptomatic, >50% confined to bed  Review of Systems: A 12  point ROS discussed and pertinent positives are indicated in the HPI above.  All other systems are negative.  Review of Systems  Constitutional: Positive for fatigue. Negative for activity change, fever and unexpected weight change.  Respiratory: Positive for shortness of breath.   Cardiovascular: Negative.   Gastrointestinal: Positive for abdominal distention. Negative for blood in stool and vomiting.  Skin: Negative for color change.  Psychiatric/Behavioral: Negative for confusion.    Vital Signs: BP (!) 121/53   Pulse 63   Temp (!) 97.5 F (36.4 C) (Oral)   Resp 14   Ht 5\' 5"  (1.651 m)   Wt 99 lb (44.9 kg)   SpO2 95% Comment: O2 at 2L/Nasal Cannual  BMI 16.47 kg/m   Physical Exam  Constitutional:  Cachectic and frail appearing  HENT:  Head: Normocephalic and atraumatic.  Eyes: Conjunctivae are normal.  Abdominal: She exhibits distension.  Easily palpable fluid wave.  Skin:  Normal skin color.  No jaundice  Psychiatric: She has a normal mood and affect. Her  behavior is normal.  Nursing note and vitals reviewed.   Imaging: [place] Paracentesis  Result Date: 01/26/2018 INDICATION: Recurrent symptomatic ascites. Please perform ultrasound-guided paracentesis for therapeutic purposes. EXAM: ULTRASOUND-GUIDED PARACENTESIS COMPARISON:  Multiple previous ultrasound-guided paracenteses, most recently on 01/17/2018 MEDICATIONS: None. COMPLICATIONS: None immediate. TECHNIQUE: Informed written consent was obtained from the patient after a discussion of the risks, benefits and alternatives to treatment. A timeout was performed prior to the initiation of the procedure. Initial ultrasound scanning demonstrates a moderate amount of ascites within the right lower abdominal quadrant. The right lower abdomen was prepped and draped in the usual sterile fashion. 1% lidocaine with epinephrine was used for local anesthesia. An ultrasound image was saved for documentation purposed. An 8 Fr  Safe-T-Centesis catheter was introduced. The paracentesis was performed. The catheter was removed and a dressing was applied. The patient tolerated the procedure well without immediate post procedural complication. FINDINGS: A total of approximately 3 liters of serous fluid was removed. IMPRESSION: Successful ultrasound-guided paracentesis yielding 3 liters of peritoneal fluid. Electronically Signed   By: Simonne ComeJohn  Elyzabeth Goatley M.D.   On: 01/26/2018 11:44   [place] Paracentesis  Result Date: 01/17/2018 INDICATION: Ascites EXAM: ULTRASOUND GUIDED THERAPEUTIC PARACENTESIS MEDICATIONS: None. COMPLICATIONS: None immediate. PROCEDURE: Informed written consent was obtained from the patient after a discussion of the risks, benefits and alternatives to treatment. A timeout was performed prior to the initiation of the procedure. Initial ultrasound scanning demonstrates a large amount of ascites within the right lower abdominal quadrant. The right lower abdomen was prepped and draped in the usual sterile fashion. 1% lidocaine was used for local anesthesia. Following this, a 6 Fr Safe-T-Centesis catheter was introduced. An ultrasound image was saved for documentation purposes. The paracentesis was performed. The catheter was removed and a dressing was applied. The patient tolerated the procedure well without immediate post procedural complication. FINDINGS: A total of approximately 5.35 L of clear yellow fluid was removed. IMPRESSION: Successful ultrasound-guided paracentesis yielding 5.35 liters of peritoneal fluid. Electronically Signed   By: Alcide CleverMark  Lukens M.D.   On: 01/17/2018 12:41   Ir Radiologist Eval & Mgmt  Result Date: 02/15/2018 Please refer to notes tab for details about interventional procedure. (Op Note)   Labs:  CBC: Recent Labs    01/01/18 0452 01/02/18 0511 01/15/18 1519 01/25/18 0940  WBC 5.4 3.6 9.1 6.5  HGB 8.5* 8.4* 11.0* 10.4*  HCT 26.3* 25.8* 32.7* 31.9*  PLT 58* 51* 104* 83*    COAGS: Recent  Labs    12/30/17 0626  INR 1.34    BMP: Recent Labs    12/31/17 0723 01/01/18 0452 01/02/18 0511 01/15/18 1519  NA 136 132* 136 133*  K 4.6 4.5 4.6 5.6*  CL 99* 95* 96* 91*  CO2 31 26 32 30  GLUCOSE 312* 532* 148* 171*  BUN 41* 56* 61* 48*  CALCIUM 8.3* 8.4* 8.7* 9.4  CREATININE 1.75* 1.89* 1.81* 2.20*  GFRNONAA 28* 26* 27* 21*  GFRAA 33* 30* 31* 25*    LIVER FUNCTION TESTS: Recent Labs    11/07/17 1714 12/30/17 0626 01/15/18 1519  BILITOT 1.4* 1.3* 1.3*  AST 43* 32 37  ALT 28 17 27   ALKPHOS 77 86 94  PROT 5.9* 6.1* 6.4*  ALBUMIN 3.2* 3.1* 3.6    TUMOR MARKERS: No results for input(s): AFPTM, CEA, CA199, CHROMGRNA in the last 8760 hours.  Assessment and Plan:  Ihor DowJackie L Scritchfield is a 72 y.o. female with complex past medical history significant for diabetes, hypothyroidism, nephrolithiasis,  COPD (on continuous oxygen supplementation) and cirrhosis of the liver (presumed to be secondary to [place] cirrhosis or medication induced cirrhosis) who presents to the interventional radiology clinic for evaluation for potential TIPS creation due to recurrent symptomatic intra-abdominal ascites.   Prolonged conversations were held with the patient and the patient's brother regarding the pathophysiology of cirrhosis as well as the mechanism of action of TIPS creation in hopes of decreasing her recurrent symptomatically ascites.   Calculated Na-MELD score based on most recent laboratories is 22, though the elevated score is predominantly driven by the patient's recent worsening of her creatinine, likely secondary to her necessitating recent large volume paracenteses and probably early hepatorenal syndrome.  While her MELD score is elevated, I am more concerned given the patient's overall debilitated condition, currently with an ECOG of 3.   With her frail state and her severe COPD I am concerned that we will have great difficulty weaning the patient from the ventilator and her  overall ability to tolerated the expected hemodynamic fluid shifts following the TIPS creation.  Given above I do not feel the patient is a good candidate for TIPS creation at this time and I agree with Dr. Allegra Lai that palliative consultation is likely warranted.   If the patient is ultimately deemed appropriate for hospice, consideration for tunneled peritoneal drainage catheter could be considered.    As such, conversations were initiated with the patient and the patient's brother regarding the benefits and risks of tunneled peritoneal drainage catheter placement however I stressed that tunnel drainage catheter placement is typically only performed in cirrhotics who are under hospice care as tunneled drainage catheters can be associated with worsening mortality.  In the meantime I would recommend the patient continue to undergo outpatient paracentesis at [facility] on a PRN basis.  Above was discussed at great length with the patient and the patient's brother who are in agreement with this proposed plan of care.  Thank you for this interesting consult.  I greatly enjoyed meeting LOCKLYN HENRIQUEZ and look forward to participating in their care.  A copy of this report was sent to the requesting provider on this date.  Electronically Signed: Simonne Come 02/15/2018, 1:46 PM   I spent a total of 30 Minutes in face to face in clinical consultation, greater than 50% of which was counseling/coordinating care for TIPS creation.

## 2018-02-19 ENCOUNTER — Ambulatory Visit
Admission: RE | Admit: 2018-02-19 | Discharge: 2018-02-19 | Disposition: A | Discharge status: Home / Self Care | Payer: Medicare Other | Source: Ambulatory Visit | Attending: Gastroenterology | Admitting: Gastroenterology

## 2018-02-19 DIAGNOSIS — R188 Other ascites: Secondary | ICD-10-CM | POA: Diagnosis not present

## 2018-02-19 NOTE — Procedures (Signed)
Pre Procedural Dx: Symptomatic Ascites Post Procedural Dx: Same  Successful US guided paracentesis yielding 2.7 L of serous ascitic fluid.  EBL: None Complications: None immediate  Jay Axton Cihlar, MD Pager #: [phone]   

## 2018-02-21 NOTE — Telephone Encounter (Signed)
Per patient Dr. Allegra LaiVanga canceled this appointment and doesn't need to reschedule. I called [facility]

## 2018-02-22 ENCOUNTER — Encounter: Admission: RE | Payer: Self-pay | Source: Ambulatory Visit

## 2018-02-22 ENCOUNTER — Ambulatory Visit: Admission: RE | Admit: 2018-02-22 | Payer: Medicare Other | Source: Ambulatory Visit | Admitting: Gastroenterology

## 2018-02-22 SURGERY — ESOPHAGOGASTRODUODENOSCOPY (EGD) WITH PROPOFOL
Anesthesia: General

## 2018-03-05 NOTE — Progress Notes (Signed)
[facility]  [phone] [phone]  ID: Angela Austin OB: 1946-02-28  MR#: [id number]  AOZ#:[id number]  Patient Care Team: Kandyce Rud, MD as PCP - General (Family Medicine)  CHIEF COMPLAINT: Thrombocytopenia, iron deficiency anemia.  INTERVAL HISTORY: Patient returns to clinic today for repeat laboratory work and further evaluation.  She continues to have a poor appetite and weight loss.  She has chronic weakness and fatigue which is unchanged.  She otherwise feels well.  She denies any recent fevers or illnesses.  She has no neurologic complaints.  She has chronic shortness of breath and requires oxygen 24 hours a day.  She denies any chest pain, cough, or hemoptysis. She denies any nausea, vomiting, constipation, or diarrhea. She has no melena or hematochezia. She has no urinary complaints.  Patient offers no further specific complaints today.  REVIEW OF SYSTEMS:   Review of Systems  Constitutional: Positive for malaise/fatigue and weight loss. Negative for fever.  Respiratory: Positive for shortness of breath. Negative for cough and hemoptysis.   Cardiovascular: Negative.  Negative for chest pain and leg swelling.  Gastrointestinal: Negative for abdominal pain, blood in stool, diarrhea, melena, nausea and vomiting.  Genitourinary: Negative.  Negative for hematuria.  Musculoskeletal: Negative.  Negative for back pain.  Neurological: Positive for weakness. Negative for sensory change.  Endo/Heme/Allergies: Does not bruise/bleed easily.  Psychiatric/Behavioral: Negative.  The patient is not nervous/anxious.     As per HPI. Otherwise, a complete review of systems is negative.  PAST MEDICAL HISTORY: Past Medical History:  Diagnosis Date  . Cirrhosis of liver not due to alcohol (HCC)   . COPD (chronic obstructive pulmonary disease) (HCC)   . Depression   . Diabetes (HCC)   . GERD (gastroesophageal reflux disease)   . Hypothyroidism    . ITP (idiopathic thrombocytopenic purpura)   . Nephrolithiasis     PAST SURGICAL HISTORY: Past Surgical History:  Procedure Laterality Date  . CATARACT EXTRACTION    . ESOPHAGOGASTRODUODENOSCOPY N/A 12/31/2017   Procedure: ESOPHAGOGASTRODUODENOSCOPY (EGD);  Surgeon: Toney Reil, MD;  Location: [facility] ENDOSCOPY;  Service: Gastroenterology;  Laterality: N/A;  . ESOPHAGOGASTRODUODENOSCOPY (EGD) WITH PROPOFOL N/A 01/25/2018   Procedure: ESOPHAGOGASTRODUODENOSCOPY (EGD) WITH PROPOFOL;  Surgeon: Toney Reil, MD;  Location: [facility] ENDOSCOPY;  Service: Gastroenterology;  Laterality: N/A;  . IR RADIOLOGIST EVAL & MGMT  02/15/2018  . LUMBAR DISC SURGERY    . TONSILLECTOMY AND ADENOIDECTOMY      FAMILY HISTORY Family History  Problem Relation Age of Onset  . Alzheimer's disease Mother   . COPD Mother   . Cancer Mother   . COPD Father   . Diabetes Mellitus II Father   . Heart attack Father        ADVANCED DIRECTIVES:    HEALTH MAINTENANCE: Social History   Tobacco Use  . Smoking status: Former Smoker    Years: 25.00    Types: Cigarettes  . Smokeless tobacco: Never Used  Substance Use Topics  . Alcohol use: No    Alcohol/week: 0.0 standard drinks  . Drug use: No     Colonoscopy:  PAP:  Bone density:  Lipid panel:  Allergies  Allergen Reactions  . Metformin Diarrhea  . Amoxicillin-Pot Clavulanate Other (See Comments)    Other reaction(s): Unknown  . Codeine   . Erythromycin   . Erythromycin Ethylsuccinate     Other reaction(s): Unknown  . Levofloxacin Other (See Comments)    Jittery, nausea  . Metformin And Related  GI bleed  . Morphine Other (See Comments) and Nausea And Vomiting    caused severe cramping   . Morphine And Related   . Morphine Sulfate Er Beads Other (See Comments)    caused severe cramping   . Omeprazole Other (See Comments)    GI side effects GI side effects  . Spiriva Handihaler [Tiotropium Bromide Monohydrate] Swelling     Tongue  . Statins Other (See Comments)    Other reaction(s): Muscle Pain  . Tetracyclines & Related   . Ace Inhibitors Rash    Current Outpatient Medications  Medication Sig Dispense Refill  . albumin human 25 % bottle Give 25g of albumin per 4 liters of fluid removed max 50g 50 mL 0  . albuterol (ACCUNEB) 1.25 MG/3ML nebulizer solution Take 1 ampule by nebulization every 6 (six) hours as needed for wheezing.    Marland Kitchen albuterol (PROVENTIL HFA) 108 (90 Base) MCG/ACT inhaler Inhale 1 puff into the lungs every 4 (four) hours as needed.     Marland Kitchen FLUoxetine (PROZAC) 20 MG tablet Take 20 mg by mouth daily.    . fluticasone (FLONASE) 50 MCG/ACT nasal spray PLACE 2 SPRAYS INTO BOTH NOSTRILS ONCE DAILY AS NEEDED FOR ALLERGIES    . furosemide (LASIX) 40 MG tablet Take 1 tablet (40 mg total) by mouth daily. 30 tablet 0  . glyBURIDE (DIABETA) 2.5 MG tablet Take 1 tablet (2.5 mg total) by mouth daily with breakfast. 30 tablet 11  . levothyroxine (SYNTHROID, LEVOTHROID) 75 MCG tablet Take 75 mcg by mouth daily before breakfast.     . loratadine (CLARITIN) 10 MG tablet Take 10 mg by mouth daily as needed for allergies.    . pantoprazole (PROTONIX) 40 MG tablet Take 40 mg by mouth 2 (two) times daily.    Marland Kitchen spironolactone (ALDACTONE) 100 MG tablet Take 1 tablet (100 mg total) by mouth daily. 30 tablet 0  . nadolol (CORGARD) 20 MG tablet Take 1 tablet (20 mg total) by mouth daily. (Patient not taking: Reported on 03/06/2018) 30 tablet 11   Current Facility-Administered Medications  Medication Dose Route Frequency Provider Last Rate Last Dose  . albumin human 25 % solution 25 g  25 g Intravenous Once Vanga, Loel Dubonnet, MD      . albumin human 25 % solution 25 g  25 g Intravenous Once Vanga, Loel Dubonnet, MD        OBJECTIVE: There were no vitals filed for this visit.   There is no height or weight on file to calculate BMI.    ECOG FS:1 - Symptomatic but completely ambulatory  General: Well-developed,  well-nourished, no acute distress. Eyes: Pink conjunctiva, anicteric sclera. HEENT: Normocephalic, moist mucous membranes. Lungs: Clear to auscultation bilaterally. Heart: Regular rate and rhythm. No rubs, murmurs, or gallops. Abdomen: Soft, nontender, nondistended. No organomegaly noted, normoactive bowel sounds. Musculoskeletal: No edema, cyanosis, or clubbing. Neuro: Alert, answering all questions appropriately. Cranial nerves grossly intact. Skin: No rashes or petechiae noted. Psych: Normal affect.  LAB RESULTS:  Lab Results  Component Value Date   NA 133 (L) 01/15/2018   K 5.6 (H) 01/15/2018   CL 91 (L) 01/15/2018   CO2 30 01/15/2018   GLUCOSE 171 (H) 01/15/2018   BUN 48 (H) 01/15/2018   CREATININE 2.20 (H) 01/15/2018   CALCIUM 9.4 01/15/2018   PROT 6.4 (L) 01/15/2018   ALBUMIN 3.6 01/15/2018   AST 37 01/15/2018   ALT 27 01/15/2018   ALKPHOS 94 01/15/2018   BILITOT 1.3 (H)  01/15/2018   GFRNONAA 21 (L) 01/15/2018   GFRAA 25 (L) 01/15/2018    Lab Results  Component Value Date   WBC 6.2 03/06/2018   NEUTROABS 5.0 03/06/2018   HGB 10.2 (L) 03/06/2018   HCT 32.0 (L) 03/06/2018   MCV 83.9 03/06/2018   PLT 51 (L) 03/06/2018     STUDIES: [place] Paracentesis  Result Date: 02/19/2018 INDICATION: History of cirrhosis with recurrent symptomatic ascites. Note, I have seen the patient in [facility] on 02/15/2018, however deemed patient poor potential TIPS candidate given her multiple medical comorbidities. Request made for ultrasound-guided paracentesis with limit of 4 L. EXAM: ULTRASOUND-GUIDED PARACENTESIS COMPARISON:  Multiple previous ultrasound-guided paracenteses, most recently on 07/28/2018 MEDICATIONS: None. COMPLICATIONS: None immediate. TECHNIQUE: Informed written consent was obtained from the patient after a discussion of the risks, benefits and alternatives to treatment. A timeout was performed prior to  the initiation of the procedure. Initial ultrasound scanning demonstrates a moderate amount of ascites within the right lower abdominal quadrant. The right lower abdomen was prepped and draped in the usual sterile fashion. 1% lidocaine with epinephrine was used for local anesthesia. An ultrasound image was saved for documentation purposed. An 8 Fr Safe-T-Centesis catheter was introduced. The paracentesis was performed. The catheter was removed and a dressing was applied. The patient tolerated the procedure well without immediate post procedural complication. FINDINGS: A total of approximately 2.7 liters of serous fluid was removed. IMPRESSION: Successful ultrasound-guided paracentesis yielding 2.7 liters of peritoneal fluid. Electronically Signed   By: Simonne ComeJohn  Watts M.D.   On: 02/19/2018 11:53   Ir Radiologist Eval & Mgmt  Result Date: 02/15/2018 Please refer to notes tab for details about interventional procedure. (Op Note)   ASSESSMENT: Thrombocytopenia, iron deficiency anemia.Marland Kitchen.  PLAN:    1. Thrombocytopenia: Likely secondary to her underlying cirrhosis and splenomegaly seen on CT scan.  Patient's platelet count remains decreased at 51, but stable and approximately her baseline. Her platelet count has ranged as low as 36 and as high as 81 since December 2015. Previously, the remainder of her laboratory work was either negative or within normal limits. No further intervention is needed at this time.  Return to clinic in 3 months for further evaluation. 2.  Iron deficiency anemia: Patient's baseline hemoglobin is approximately 9-10.  Hemoglobin today is 10.2 and her iron stores continue to be within normal limits.  She does not require additional Feraheme today.  Patient last received treatment on August 30, 2017.  Return to clinic in 3 months as above.  3.  Leukopenia: Resolved. 4.  Cirrhosis/ascites: Patient's most recent ultrasound paracentesis was on February 19, 2018.      Patient expressed  understanding and was in agreement with this plan. She also understands that She can call clinic at any time with any questions, concerns, or complaints.    Jeralyn Ruthsimothy J Maksim Peregoy, MD   03/10/2018 6:40 AM

## 2018-03-06 ENCOUNTER — Inpatient Hospital Stay (HOSPITAL_BASED_OUTPATIENT_CLINIC_OR_DEPARTMENT_OTHER): Payer: Medicare Other | Admitting: Oncology

## 2018-03-06 ENCOUNTER — Inpatient Hospital Stay: Payer: Medicare Other | Attending: Oncology

## 2018-03-06 ENCOUNTER — Inpatient Hospital Stay: Payer: Medicare Other

## 2018-03-06 DIAGNOSIS — K7460 Unspecified cirrhosis of liver: Secondary | ICD-10-CM | POA: Insufficient documentation

## 2018-03-06 DIAGNOSIS — R630 Anorexia: Secondary | ICD-10-CM | POA: Diagnosis not present

## 2018-03-06 DIAGNOSIS — R634 Abnormal weight loss: Secondary | ICD-10-CM | POA: Insufficient documentation

## 2018-03-06 DIAGNOSIS — R0602 Shortness of breath: Secondary | ICD-10-CM | POA: Diagnosis not present

## 2018-03-06 DIAGNOSIS — Z9981 Dependence on supplemental oxygen: Secondary | ICD-10-CM | POA: Insufficient documentation

## 2018-03-06 DIAGNOSIS — D696 Thrombocytopenia, unspecified: Secondary | ICD-10-CM | POA: Insufficient documentation

## 2018-03-06 DIAGNOSIS — D509 Iron deficiency anemia, unspecified: Secondary | ICD-10-CM

## 2018-03-06 DIAGNOSIS — R188 Other ascites: Secondary | ICD-10-CM | POA: Diagnosis not present

## 2018-03-06 DIAGNOSIS — Z87891 Personal history of nicotine dependence: Secondary | ICD-10-CM | POA: Diagnosis not present

## 2018-03-06 LAB — CBC WITH DIFFERENTIAL/PLATELET
BASOS ABS: 0 10*3/uL (ref 0–0.1)
BASOS PCT: 1 %
EOS ABS: 0.1 10*3/uL (ref 0–0.7)
Eosinophils Relative: 2 %
HEMATOCRIT: 32 % — AB (ref 35.0–47.0)
HEMOGLOBIN: 10.2 g/dL — AB (ref 12.0–16.0)
Lymphocytes Relative: 5 %
Lymphs Abs: 0.3 10*3/uL — ABNORMAL LOW (ref 1.0–3.6)
MCH: 26.8 pg (ref 26.0–34.0)
MCHC: 32 g/dL (ref 32.0–36.0)
MCV: 83.9 fL (ref 80.0–100.0)
MONOS PCT: 11 %
Monocytes Absolute: 0.7 10*3/uL (ref 0.2–0.9)
NEUTROS ABS: 5 10*3/uL (ref 1.4–6.5)
NEUTROS PCT: 81 %
Platelets: 51 10*3/uL — ABNORMAL LOW (ref 150–440)
RBC: 3.81 MIL/uL (ref 3.80–5.20)
RDW: 16.6 % — ABNORMAL HIGH (ref 11.5–14.5)
WBC: 6.2 10*3/uL (ref 3.6–11.0)

## 2018-03-06 LAB — IRON AND TIBC
Iron: 36 ug/dL (ref 28–170)
SATURATION RATIOS: 15 % (ref 10.4–31.8)
TIBC: 233 ug/dL — ABNORMAL LOW (ref 250–450)
UIBC: 197 ug/dL

## 2018-03-06 LAB — FERRITIN: FERRITIN: 404 ng/mL — AB (ref 11–307)

## 2018-03-06 NOTE — Progress Notes (Signed)
Pt in for follow up, weight down 9 lbs, stopped taking nadadol per self.  Drinking and eating premier protein supplements.

## 2018-03-14 ENCOUNTER — Encounter: Payer: Self-pay | Admitting: Gastroenterology

## 2018-03-14 ENCOUNTER — Ambulatory Visit (INDEPENDENT_AMBULATORY_CARE_PROVIDER_SITE_OTHER): Payer: Medicare Other | Admitting: Gastroenterology

## 2018-03-14 ENCOUNTER — Other Ambulatory Visit: Payer: Self-pay

## 2018-03-14 VITALS — BP 129/68 | HR 103 | Resp 18 | Wt 89.5 lb

## 2018-03-14 DIAGNOSIS — K7581 Nonalcoholic steatohepatitis (NASH): Secondary | ICD-10-CM

## 2018-03-14 DIAGNOSIS — K7460 Unspecified cirrhosis of liver: Secondary | ICD-10-CM | POA: Diagnosis not present

## 2018-03-14 DIAGNOSIS — I8510 Secondary esophageal varices without bleeding: Secondary | ICD-10-CM | POA: Diagnosis not present

## 2018-03-14 DIAGNOSIS — K7290 Hepatic failure, unspecified without coma: Secondary | ICD-10-CM

## 2018-03-14 NOTE — Progress Notes (Signed)
Arlyss Repress, MD [address]  [address]  [place], [place] [address]  Main: [phone]  Fax: [phone]    Gastroenterology Consultation  Referring Provider:     Kandyce Rud, MD Primary Care Physician:  Kandyce Rud, MD Primary Gastroenterologist:  Dr. Arlyss Repress Reason for Consultation:     Decompensated cirrhosis        HPI:   ELENNA SPRATLING is a 72 y.o. female referred by Dr. Kandyce Rud, MD  for consultation & management of  decompensated cirrhosis. She was admitted to [facility] in early June 2019 secondary to 2 months history of progressively worsening abdominal distention and swelling of legs, her symptoms actually started in 09/2017, she also noticed black tarry stools for one day before presentation to the ER. She used to see Dr. Lutricia Feil until 2015. She was on furosemide 20 mg twice daily and spironolactone 50 mg daily. She reports that she is diagnosed with cirrhosis about 5 years ago when she presented with GI bleed. Her viral hepatitis panel was negative, anti-smooth muscle antibodies and antimitochondrial antibodies were negative. She thinks that her cirrhosis is secondary to long-term metformin use. She also has COPD and oxygen dependent. She had an ultrasound on admission and was found to have cirrhosis with moderate ascites and underwent paracentesis with 7.4 L of clear fluid removed. There was no evidence of SBP. she underwent EGD and was found to have large esophageal varices with stigmata of recent bleeding and underwent ligation. She was discharged home on furosemide, spironolactone, nadolol. Her creatinine was slowly rising at the time of discharge  Follow-up visit 01/15/2018: She feels weak, abdomen is distended, swelling of legs is better. She is accompanied by her daughter-in-law who helps her with medical decision making. She is in wheelchair and on home O2, currently at 3 L. She denies melena, hematemesis, rectal  bleeding. She is taking lactulose syrup and having 2-3 bowel movements daily. She is taking furosemide 40 mg daily, spironolactone 100 mg daily, and nadolol 20 mg daily. Her father who is 54 years old is actually helping her at home. Patient is concerned about help not being available anymore to take care of her at home. She does consume canned foods as she has hard time cooking food at home due to her physical condition.  Follow-up visits 02/14/2018 She underwent EGD with variceal ligation. She also underwent therapeutic paracentesis 3 times last month, there was no evidence of SBP. She is seeing interventional radiology for evaluation of TIPS tomorrow. She fell on her left hip few days back. Pain is getting better, denies any bruise in the area from the fall. She reports petechiae and ecchymosis in her upper extremities and lower extremities and worried if her platelets are low. Petechia in her lower extremities resulted from shaving. She does have abdominal distention from ascites but not tense. She does not think she will need paracentesis immediately. She denies rectal bleeding, black stools, hematemesis. She does report early satiety and nausea after each meal. She denies fever, chills, vomiting, abdominal pain. She is waiting to be seen by palliative care  Follow-up visit 03/14/2018 Patient saw palliative care today. She was seen by interventional radiology and not deemed to be a candidate for TIPS due to her significant comorbidities and end-stage COPD. Patient did not show up for upper endoscopy for variceal surveillance last month. Her last paracentesis was on 02/19/2018 weight 2.7 L of fluid was removed. She denies abdominal distention, swelling  of legs. She stopped nadolol by herself due to tremors. She is not experiencing tremors anymore. She denies black stools, rectal bleeding or hematemesis. She does report being constipated for which she is taking over-the-counter stool softeners. Overall, she  feels that her energy levels are low. She is accompanied by her father today  NSAIDs: none  Antiplts/Anticoagulants/Anti thrombotics: none  GI Procedures: she had EGD on 05/23/2014 at [facility] Report not available EGD 12/31/17 Findings: - Normal duodenal bulb and second portion of the duodenum. - Portal hypertensive gastropathy. - Recently bleeding large (> 5 mm) esophageal varices. Incompletely eradicated. Bandedx4. - No specimens collected.  EGD 01/25/2018 Large esophageal varices, banded x 3  She never had a colonoscopy   Past Medical History:  Diagnosis Date  . Cirrhosis of liver not due to alcohol (HCC)   . COPD (chronic obstructive pulmonary disease) (HCC)   . Depression   . Diabetes (HCC)   . GERD (gastroesophageal reflux disease)   . Hypothyroidism   . ITP (idiopathic thrombocytopenic purpura)   . Nephrolithiasis     Past Surgical History:  Procedure Laterality Date  . CATARACT EXTRACTION    . ESOPHAGOGASTRODUODENOSCOPY N/A 12/31/2017   Procedure: ESOPHAGOGASTRODUODENOSCOPY (EGD);  Surgeon: Toney ReilVanga, Sundy Houchins Reddy, MD;  [facility];  Service: Gastroenterology;  Laterality: N/A;  . ESOPHAGOGASTRODUODENOSCOPY (EGD) WITH PROPOFOL N/A 01/25/2018   Procedure: ESOPHAGOGASTRODUODENOSCOPY (EGD) WITH PROPOFOL;  Surgeon: Toney ReilVanga, Jolanda Mccann Reddy, MD;  [facility];  Service: Gastroenterology;  Laterality: N/A;  . IR RADIOLOGIST EVAL & MGMT  02/15/2018  . LUMBAR DISC SURGERY    . TONSILLECTOMY AND ADENOIDECTOMY      Current Outpatient Medications:  .  albumin human 25 % bottle, Give 25g of albumin per 4 liters of fluid removed max 50g, Disp: 50 mL, Rfl: 0 .  albuterol (ACCUNEB) 1.25 MG/3ML nebulizer solution, Take 1 ampule by nebulization every 6 (six) hours as needed for wheezing., Disp: , Rfl:  .  albuterol (PROVENTIL HFA) 108 (90 Base) MCG/ACT inhaler, Inhale 1 puff into the lungs every 4 (four) hours as needed. , Disp: , Rfl:  .  FLUoxetine (PROZAC) 20 MG  capsule, , Disp: , Rfl:  .  FLUoxetine (PROZAC) 20 MG tablet, Take 20 mg by mouth daily., Disp: , Rfl:  .  fluticasone (FLONASE) 50 MCG/ACT nasal spray, PLACE 2 SPRAYS INTO BOTH NOSTRILS ONCE DAILY AS NEEDED FOR ALLERGIES, Disp: , Rfl:  .  furosemide (LASIX) 40 MG tablet, Take 1 tablet (40 mg total) by mouth daily., Disp: 30 tablet, Rfl: 0 .  glyBURIDE (DIABETA) 2.5 MG tablet, Take 1 tablet (2.5 mg total) by mouth daily with breakfast., Disp: 30 tablet, Rfl: 11 .  levothyroxine (SYNTHROID, LEVOTHROID) 75 MCG tablet, Take 75 mcg by mouth daily before breakfast. , Disp: , Rfl:  .  loratadine (CLARITIN) 10 MG tablet, Take 10 mg by mouth daily as needed for allergies., Disp: , Rfl:  .  pantoprazole (PROTONIX) 40 MG tablet, Take 40 mg by mouth 2 (two) times daily., Disp: , Rfl:  .  spironolactone (ALDACTONE) 100 MG tablet, Take 1 tablet (100 mg total) by mouth daily., Disp: 30 tablet, Rfl: 0 .  triamcinolone ointment (KENALOG) 0.5 %, Apply topically., Disp: , Rfl:  .  nadolol (CORGARD) 20 MG tablet, Take 1 tablet (20 mg total) by mouth daily. (Patient not taking: Reported on 03/14/2018), Disp: 30 tablet, Rfl: 11  Current Facility-Administered Medications:  .  albumin human 25 % solution 25 g, 25 g, Intravenous, Once, Oluwatobi Visser,  Loel Dubonnet, MD .  albumin human 25 % solution 25 g, 25 g, Intravenous, Once, Franchot Pollitt, Loel Dubonnet, MD   Family History  Problem Relation Age of Onset  . Alzheimer's disease Mother   . COPD Mother   . Cancer Mother   . COPD Father   . Diabetes Mellitus II Father   . Heart attack Father      Social History   Tobacco Use  . Smoking status: Former Smoker    Years: 25.00    Types: Cigarettes  . Smokeless tobacco: Never Used  Substance Use Topics  . Alcohol use: No    Alcohol/week: 0.0 standard drinks  . Drug use: No    Allergies as of 03/14/2018 - Review Complete 03/14/2018  Allergen Reaction Noted  . Metformin Diarrhea 12/13/2013  . Amoxicillin-pot clavulanate  Other (See Comments) 12/13/2013  . Codeine  07/03/2014  . Erythromycin  07/03/2014  . Erythromycin ethylsuccinate  12/13/2013  . Levofloxacin Other (See Comments) 11/24/2015  . Metformin and related  07/14/2014  . Morphine Other (See Comments) and Nausea And Vomiting 12/13/2013  . Morphine and related  07/03/2014  . Morphine sulfate er beads Other (See Comments) 02/10/2015  . Omeprazole Other (See Comments) 12/13/2013  . Spiriva handihaler [tiotropium bromide monohydrate] Swelling 07/03/2014  . Statins Other (See Comments) 12/13/2013  . Tetracyclines & related  07/03/2014  . Ace inhibitors Rash 12/13/2013    Review of Systems:    All systems reviewed and negative except where noted in HPI.   Physical Exam:  BP 129/68 (BP Location: Left Arm, Patient Position: Sitting, Cuff Size: Normal)   Pulse (!) 103   Resp 18   Wt 89 lb 8 oz (40.6 kg)   BMI 14.89 kg/m  No LMP recorded. Patient is postmenopausal.  General:   Alert,  Ill-appearing, cachectic, in mild respiratory distress due to COPD Head:  Normocephalic and atraumatic, bitemporal wasting. Eyes:  Sclera clear, no icterus. Conjunctiva pink. Ears:  Normal auditory acuity. Nose:  No deformity, discharge, or lesions. Mouth:  No deformity or lesions,oropharynx pink & moist. Neck:  Supple; no masses or thyromegaly. Lungs:  Respirations even and unlabored, shallow breathing.  Clear throughout to auscultation.   No wheezes, crackles, or rhonchi. No acute distress. Heart:  Regular rate and rhythm; no murmurs, clicks, rubs, or gallops. Abdomen:  Normal bowel sounds. Soft, non-tender, nondistended.  No guarding or rebound tenderness.   Rectal: Not performed Msk: significant muscle wasting of all extremities, Symmetrical without gross deformities.  Pulses:  Normal pulses noted. Extremities:  No clubbing, 1+edema.  No cyanosis. Neurologic:  Alert and oriented x3;  grossly normal neurologically. Skin:  bruising in bilateral upper  extremities, No jaundice. Lymph Nodes:  No significant cervical adenopathy. Psych:  Alert and cooperative. Normal mood and affect.  Imaging Studies: reviewed  Assessment and Plan:   TYA HAUGHEY is a 72 y.o. Caucasian female with history of diabetes, hypertension, end stage COPD on home O2, decompensated cirrhosis with ascites needing serial therapeutic paracentesis, history of upper GI bleed from esophageal varices, status post EGD in 12/2017 with EVL 4. 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 There was no evidence of SBP. SAAG>1.1 On furosemide 40 mg daily, spironolactone 100 mg daily. Therapeutic paracentesis as needed with administration  of IV albumin post-paracentesis. Referred to interventional radiology for evaluation of TIPS and deemed not a candidate because of her comorbidities. Electrolytes are within normal limits Varices: large esophageal varices status post EVL, portal hypertensive gastropathy Repeat EGD in next 2 weeks,patient self discontinued nadolol due to tremors. She does not want to try propranolol. HCC screening: recent ultrasound in 12/2017 with no evidence of liver lesions, check AFP Recheck ultrasound in 6 months Highly encouraged high protein diet and low-sodium diet Discussed in length with her about different recipes for high protein intake  Liver transplant evaluation: not a candidate secondary to end-stage COPD which puts her at high risk for short-term mortality unless she gets both liver and lung transplant simultaneously. She is also severely malnourished which makes her prognosis even worse. Being followed by palliative care   Follow up in 2 months   Arlyss Repressohini R Chamberlain Steinborn,  MD

## 2018-03-18 ENCOUNTER — Other Ambulatory Visit: Payer: Self-pay

## 2018-03-18 ENCOUNTER — Inpatient Hospital Stay
Admission: EM | Admit: 2018-03-18 | Discharge: 2018-03-23 | DRG: 441 | Disposition: A | Discharge status: Hospice | Payer: Medicare Other | Attending: Internal Medicine | Admitting: Internal Medicine

## 2018-03-18 ENCOUNTER — Emergency Department: Payer: Medicare Other

## 2018-03-18 ENCOUNTER — Encounter: Payer: Self-pay | Admitting: *Deleted

## 2018-03-18 DIAGNOSIS — Z885 Allergy status to narcotic agent status: Secondary | ICD-10-CM

## 2018-03-18 DIAGNOSIS — D631 Anemia in chronic kidney disease: Secondary | ICD-10-CM | POA: Diagnosis present

## 2018-03-18 DIAGNOSIS — Z87891 Personal history of nicotine dependence: Secondary | ICD-10-CM

## 2018-03-18 DIAGNOSIS — E43 Unspecified severe protein-calorie malnutrition: Secondary | ICD-10-CM | POA: Diagnosis present

## 2018-03-18 DIAGNOSIS — Z833 Family history of diabetes mellitus: Secondary | ICD-10-CM

## 2018-03-18 DIAGNOSIS — E039 Hypothyroidism, unspecified: Secondary | ICD-10-CM | POA: Diagnosis present

## 2018-03-18 DIAGNOSIS — K219 Gastro-esophageal reflux disease without esophagitis: Secondary | ICD-10-CM | POA: Diagnosis present

## 2018-03-18 DIAGNOSIS — K7200 Acute and subacute hepatic failure without coma: Principal | ICD-10-CM | POA: Diagnosis present

## 2018-03-18 DIAGNOSIS — K7460 Unspecified cirrhosis of liver: Secondary | ICD-10-CM | POA: Diagnosis present

## 2018-03-18 DIAGNOSIS — R188 Other ascites: Secondary | ICD-10-CM

## 2018-03-18 DIAGNOSIS — Z7189 Other specified counseling: Secondary | ICD-10-CM

## 2018-03-18 DIAGNOSIS — Z79890 Hormone replacement therapy (postmenopausal): Secondary | ICD-10-CM | POA: Diagnosis not present

## 2018-03-18 DIAGNOSIS — Z66 Do not resuscitate: Secondary | ICD-10-CM | POA: Diagnosis present

## 2018-03-18 DIAGNOSIS — J449 Chronic obstructive pulmonary disease, unspecified: Secondary | ICD-10-CM | POA: Diagnosis present

## 2018-03-18 DIAGNOSIS — Z7984 Long term (current) use of oral hypoglycemic drugs: Secondary | ICD-10-CM | POA: Diagnosis not present

## 2018-03-18 DIAGNOSIS — Z9981 Dependence on supplemental oxygen: Secondary | ICD-10-CM

## 2018-03-18 DIAGNOSIS — Z862 Personal history of diseases of the blood and blood-forming organs and certain disorders involving the immune mechanism: Secondary | ICD-10-CM | POA: Diagnosis not present

## 2018-03-18 DIAGNOSIS — K7290 Hepatic failure, unspecified without coma: Secondary | ICD-10-CM | POA: Diagnosis present

## 2018-03-18 DIAGNOSIS — K7581 Nonalcoholic steatohepatitis (NASH): Secondary | ICD-10-CM | POA: Diagnosis present

## 2018-03-18 DIAGNOSIS — K7210 Chronic hepatic failure without coma: Secondary | ICD-10-CM | POA: Diagnosis present

## 2018-03-18 DIAGNOSIS — Z681 Body mass index (BMI) 19 or less, adult: Secondary | ICD-10-CM

## 2018-03-18 DIAGNOSIS — F329 Major depressive disorder, single episode, unspecified: Secondary | ICD-10-CM | POA: Diagnosis present

## 2018-03-18 DIAGNOSIS — N179 Acute kidney failure, unspecified: Secondary | ICD-10-CM | POA: Diagnosis not present

## 2018-03-18 DIAGNOSIS — Z515 Encounter for palliative care: Secondary | ICD-10-CM | POA: Diagnosis present

## 2018-03-18 DIAGNOSIS — Z888 Allergy status to other drugs, medicaments and biological substances status: Secondary | ICD-10-CM

## 2018-03-18 DIAGNOSIS — Z79899 Other long term (current) drug therapy: Secondary | ICD-10-CM

## 2018-03-18 DIAGNOSIS — E1122 Type 2 diabetes mellitus with diabetic chronic kidney disease: Secondary | ICD-10-CM | POA: Diagnosis present

## 2018-03-18 DIAGNOSIS — K7682 Hepatic encephalopathy: Secondary | ICD-10-CM | POA: Diagnosis present

## 2018-03-18 DIAGNOSIS — N184 Chronic kidney disease, stage 4 (severe): Secondary | ICD-10-CM | POA: Diagnosis present

## 2018-03-18 DIAGNOSIS — R531 Weakness: Secondary | ICD-10-CM | POA: Diagnosis not present

## 2018-03-18 DIAGNOSIS — R627 Adult failure to thrive: Secondary | ICD-10-CM | POA: Diagnosis present

## 2018-03-18 LAB — COMPREHENSIVE METABOLIC PANEL
ALT: 27 U/L (ref 0–44)
ANION GAP: 10 (ref 5–15)
AST: 38 U/L (ref 15–41)
Albumin: 3.5 g/dL (ref 3.5–5.0)
Alkaline Phosphatase: 89 U/L (ref 38–126)
BUN: 68 mg/dL — ABNORMAL HIGH (ref 8–23)
CO2: 33 mmol/L — AB (ref 22–32)
CREATININE: 2.04 mg/dL — AB (ref 0.44–1.00)
Calcium: 9.5 mg/dL (ref 8.9–10.3)
Chloride: 96 mmol/L — ABNORMAL LOW (ref 98–111)
GFR, EST AFRICAN AMERICAN: 27 mL/min — AB (ref >60)
GFR, EST NON AFRICAN AMERICAN: 23 mL/min — AB (ref >60)
Glucose, Bld: 163 mg/dL — ABNORMAL HIGH (ref 70–99)
POTASSIUM: 4.6 mmol/L (ref 3.5–5.1)
SODIUM: 139 mmol/L (ref 135–145)
Total Bilirubin: 1.2 mg/dL (ref 0.3–1.2)
Total Protein: 6.3 g/dL — ABNORMAL LOW (ref 6.5–8.1)

## 2018-03-18 LAB — CBC WITH DIFFERENTIAL/PLATELET
Basophils Absolute: 0 10*3/uL (ref 0–0.1)
Basophils Relative: 1 %
EOS ABS: 0.1 10*3/uL (ref 0–0.7)
EOS PCT: 2 %
HCT: 32.1 % — ABNORMAL LOW (ref 35.0–47.0)
Hemoglobin: 10.7 g/dL — ABNORMAL LOW (ref 12.0–16.0)
LYMPHS ABS: 0.4 10*3/uL — AB (ref 1.0–3.6)
LYMPHS PCT: 7 %
MCH: 27.3 pg (ref 26.0–34.0)
MCHC: 33.3 g/dL (ref 32.0–36.0)
MCV: 82 fL (ref 80.0–100.0)
MONO ABS: 0.4 10*3/uL (ref 0.2–0.9)
Monocytes Relative: 8 %
Neutro Abs: 4.2 10*3/uL (ref 1.4–6.5)
Neutrophils Relative %: 82 %
Platelets: 76 10*3/uL — ABNORMAL LOW (ref 150–440)
RBC: 3.92 MIL/uL (ref 3.80–5.20)
RDW: 16.8 % — AB (ref 11.5–14.5)
WBC: 5.1 10*3/uL (ref 3.6–11.0)

## 2018-03-18 LAB — URINALYSIS, COMPLETE (UACMP) WITH MICROSCOPIC
Bacteria, UA: NONE SEEN
Bilirubin Urine: NEGATIVE
GLUCOSE, UA: NEGATIVE mg/dL
KETONES UR: NEGATIVE mg/dL
LEUKOCYTES UA: NEGATIVE
NITRITE: NEGATIVE
PH: 5 (ref 5.0–8.0)
Protein, ur: NEGATIVE mg/dL
Specific Gravity, Urine: 1.012 (ref 1.005–1.030)
Squamous Epithelial / LPF: NONE SEEN (ref 0–5)

## 2018-03-18 LAB — GLUCOSE, CAPILLARY: Glucose-Capillary: 174 mg/dL — ABNORMAL HIGH (ref 70–99)

## 2018-03-18 LAB — PROTIME-INR
INR: 1.25
Prothrombin Time: 15.6 seconds — ABNORMAL HIGH (ref 11.4–15.2)

## 2018-03-18 LAB — AMMONIA: AMMONIA: 74 umol/L — AB (ref 9–35)

## 2018-03-18 LAB — TROPONIN I

## 2018-03-18 MED ORDER — LEVOTHYROXINE SODIUM 50 MCG PO TABS
75.0000 ug | ORAL_TABLET | Freq: Every day | ORAL | Status: DC
  Administered 2018-03-18 – 2018-03-20 (×3): 75 ug via ORAL
  Filled 2018-03-18 (×3): qty 1

## 2018-03-18 MED ORDER — LACTULOSE 10 GM/15ML PO SOLN
20.0000 g | Freq: Two times a day (BID) | ORAL | Status: DC
  Administered 2018-03-18 – 2018-03-20 (×4): 20 g via ORAL
  Filled 2018-03-18 (×4): qty 30

## 2018-03-18 MED ORDER — ALBUTEROL SULFATE (2.5 MG/3ML) 0.083% IN NEBU
2.5000 mg | INHALATION_SOLUTION | Freq: Once | RESPIRATORY_TRACT | Status: AC
  Administered 2018-03-18: 2.5 mg via RESPIRATORY_TRACT
  Filled 2018-03-18: qty 3

## 2018-03-18 MED ORDER — INSULIN ASPART 100 UNIT/ML ~~LOC~~ SOLN: 0.0000 [IU] | Freq: Every day | SUBCUTANEOUS | Status: DC

## 2018-03-18 MED ORDER — INSULIN ASPART 100 UNIT/ML ~~LOC~~ SOLN
0.0000 [IU] | Freq: Three times a day (TID) | SUBCUTANEOUS | Status: DC
  Administered 2018-03-18 – 2018-03-19 (×2): 2 [IU] via SUBCUTANEOUS
  Administered 2018-03-19 – 2018-03-20 (×2): 1 [IU] via SUBCUTANEOUS
  Administered 2018-03-20: 18:00:00 5 [IU] via SUBCUTANEOUS
  Administered 2018-03-21: 08:00:00 2 [IU] via SUBCUTANEOUS
  Administered 2018-03-21: 1 [IU] via SUBCUTANEOUS
  Filled 2018-03-18 (×7): qty 1

## 2018-03-18 MED ORDER — FLUTICASONE PROPIONATE 50 MCG/ACT NA SUSP
1.0000 | Freq: Every day | NASAL | Status: DC
  Administered 2018-03-18 – 2018-03-21 (×4): 1 via NASAL
  Filled 2018-03-18: qty 16

## 2018-03-18 MED ORDER — SPIRONOLACTONE 25 MG PO TABS
100.0000 mg | ORAL_TABLET | Freq: Every day | ORAL | Status: DC
  Administered 2018-03-18 – 2018-03-20 (×3): 100 mg via ORAL
  Filled 2018-03-18 (×3): qty 4

## 2018-03-18 MED ORDER — TRIAMCINOLONE ACETONIDE 0.5 % EX OINT
TOPICAL_OINTMENT | Freq: Two times a day (BID) | CUTANEOUS | Status: DC
  Administered 2018-03-19: 09:00:00 via TOPICAL
  Filled 2018-03-18: qty 15

## 2018-03-18 MED ORDER — DOCUSATE SODIUM 100 MG PO CAPS
100.0000 mg | ORAL_CAPSULE | Freq: Every day | ORAL | Status: DC | PRN
  Filled 2018-03-18: qty 1

## 2018-03-18 MED ORDER — ENOXAPARIN SODIUM 30 MG/0.3ML ~~LOC~~ SOLN
30.0000 mg | SUBCUTANEOUS | Status: DC
  Administered 2018-03-18 – 2018-03-19 (×2): 30 mg via SUBCUTANEOUS
  Filled 2018-03-18 (×2): qty 0.3

## 2018-03-18 MED ORDER — LACTULOSE 10 GM/15ML PO SOLN
30.0000 g | Freq: Once | ORAL | Status: AC
  Administered 2018-03-18: 30 g via ORAL
  Filled 2018-03-18: qty 60

## 2018-03-18 MED ORDER — ALBUTEROL SULFATE HFA 108 (90 BASE) MCG/ACT IN AERS
1.0000 | INHALATION_SPRAY | RESPIRATORY_TRACT | Status: DC | PRN
Start: 2018-03-18 — End: 2018-03-18

## 2018-03-18 MED ORDER — ALBUTEROL SULFATE (2.5 MG/3ML) 0.083% IN NEBU: 1.2500 mg | INHALATION_SOLUTION | Freq: Four times a day (QID) | RESPIRATORY_TRACT | Status: DC | PRN

## 2018-03-18 MED ORDER — LORATADINE 10 MG PO TABS: 10.0000 mg | ORAL_TABLET | Freq: Every day | ORAL | Status: DC | PRN

## 2018-03-18 MED ORDER — GLYBURIDE 2.5 MG PO TABS
2.5000 mg | ORAL_TABLET | Freq: Every day | ORAL | Status: DC
  Administered 2018-03-19 – 2018-03-21 (×3): 2.5 mg via ORAL
  Filled 2018-03-18 (×3): qty 1

## 2018-03-18 MED ORDER — ONDANSETRON HCL 4 MG/2ML IJ SOLN: 4.0000 mg | Freq: Four times a day (QID) | INTRAMUSCULAR | Status: DC | PRN

## 2018-03-18 MED ORDER — ONDANSETRON HCL 4 MG PO TABS: 4.0000 mg | ORAL_TABLET | Freq: Four times a day (QID) | ORAL | Status: DC | PRN

## 2018-03-18 MED ORDER — FLUOXETINE HCL 20 MG PO CAPS
20.0000 mg | ORAL_CAPSULE | Freq: Every day | ORAL | Status: DC
  Administered 2018-03-18 – 2018-03-21 (×4): 20 mg via ORAL
  Filled 2018-03-18 (×4): qty 1

## 2018-03-18 MED ORDER — FUROSEMIDE 40 MG PO TABS
40.0000 mg | ORAL_TABLET | Freq: Every day | ORAL | Status: DC
  Administered 2018-03-18 – 2018-03-20 (×3): 40 mg via ORAL
  Filled 2018-03-18 (×3): qty 1

## 2018-03-18 MED ORDER — PANTOPRAZOLE SODIUM 40 MG PO TBEC
40.0000 mg | DELAYED_RELEASE_TABLET | Freq: Two times a day (BID) | ORAL | Status: DC
  Administered 2018-03-18 – 2018-03-21 (×6): 40 mg via ORAL
  Filled 2018-03-18 (×6): qty 1

## 2018-03-18 NOTE — ED Provider Notes (Signed)
[facility] Emergency Department Provider Note    First MD Initiated Contact with Patient 03/18/18 1043     (approximate)  I have reviewed the triage vital signs and the nursing notes.   HISTORY  Chief Complaint Weakness  Level V Caveat:  AMS presumed hepatic encephalopathy  HPI Angela Austin is a 72 y.o. 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  States that she had decreased urine output.  Denies any pain but feels very confused and having difficulty walking.  Denies any fevers.  No abdominal pain.  Does wear home oxygen.  Seems very confused and is a poor historian.    Past Medical History:  Diagnosis Date  . Cirrhosis of liver not due to alcohol (HCC)   . COPD (chronic obstructive pulmonary disease) (HCC)   . Depression   . Diabetes (HCC)   . GERD (gastroesophageal reflux disease)   . Hypothyroidism   . ITP (idiopathic thrombocytopenic purpura)   . Nephrolithiasis    Family History  Problem Relation Age of Onset  . Alzheimer's disease Mother   . COPD Mother   . Cancer Mother   . COPD Father   . Diabetes Mellitus II Father   . Heart attack Father    Past Surgical History:  Procedure Laterality Date  . CATARACT EXTRACTION    . ESOPHAGOGASTRODUODENOSCOPY N/A 12/31/2017   Procedure: ESOPHAGOGASTRODUODENOSCOPY (EGD);  Surgeon: Toney ReilVanga, Rohini Reddy, MD;  [facility];  Service: Gastroenterology;  Laterality: N/A;  . ESOPHAGOGASTRODUODENOSCOPY (EGD) WITH PROPOFOL N/A 01/25/2018   Procedure: ESOPHAGOGASTRODUODENOSCOPY (EGD) WITH PROPOFOL;  Surgeon: Toney ReilVanga, Rohini Reddy, MD;  [facility];  Service: Gastroenterology;  Laterality: N/A;  . IR RADIOLOGIST EVAL & MGMT  02/15/2018  . LUMBAR DISC SURGERY    . TONSILLECTOMY AND ADENOIDECTOMY     Patient Active Problem List   Diagnosis Date Noted  . Hepatic encephalopathy (HCC)  03/18/2018  . Ascites 12/30/2017  . Melena 12/30/2017  . Thrombocytopenia (HCC) 02/10/2015  . Physical deconditioning 07/03/2014  . COPD exacerbation (HCC) 07/03/2014  . Cirrhosis of liver not due to alcohol (HCC) 07/03/2014  . Essential hypertension, benign 07/03/2014  . Anemia, iron deficiency 07/03/2014  . Major depression 07/03/2014  . Esophageal varices in cirrhosis (HCC) 07/03/2014  . Secondary esophageal varices with bleeding (HCC) 06/06/2014  . Centrilobular emphysema (HCC) 12/24/2013  . Other and unspecified hyperlipidemia 12/24/2013  . Type II or unspecified type diabetes mellitus without mention of complication, uncontrolled 12/24/2013  . Hypothyroidism 12/24/2013      Prior to Admission medications   Medication Sig Start Date End Date Taking? Authorizing Provider  albuterol (ACCUNEB) 1.25 MG/3ML nebulizer solution Take 1 ampule by nebulization every 6 (six) hours as needed for wheezing.   Yes [provider]  albuterol (PROVENTIL HFA) 108 (90 Base) MCG/ACT inhaler Inhale 1 puff into the lungs every 4 (four) hours as needed.    Yes [provider]  FLUoxetine (PROZAC) 20 MG tablet Take 20 mg by mouth daily.   Yes [provider]  fluticasone (FLONASE) 50 MCG/ACT nasal spray Place 1-2 sprays into both nostrils daily.    Yes [provider]  furosemide (LASIX) 40 MG tablet Take 1 tablet (40 mg total) by mouth daily. 01/03/18  Yes Adrian SaranMody, Sital, MD  glyBURIDE (DIABETA) 2.5 MG tablet Take 1 tablet (2.5 mg total) by mouth daily with breakfast. 01/02/18 01/02/19  Yes Adrian Saran, MD  levothyroxine (SYNTHROID, LEVOTHROID) 75 MCG tablet Take 75 mcg by mouth daily before breakfast.  01/07/16  Yes [provider]  loratadine (CLARITIN) 10 MG tablet Take 10 mg by mouth daily as needed for allergies.   Yes [provider]  pantoprazole (PROTONIX) 40 MG tablet Take 40 mg by mouth 2 (two) times daily.   Yes [provider]    spironolactone (ALDACTONE) 100 MG tablet Take 1 tablet (100 mg total) by mouth daily. 01/03/18  Yes Adrian Saran, MD  albumin human 25 % bottle Give 25g of albumin per 4 liters of fluid removed max 50g Patient not taking: Reported on 03/18/2018 01/15/18   Toney Reil, MD    Allergies Metformin; Amoxicillin-pot clavulanate; Codeine; Erythromycin; Erythromycin ethylsuccinate; Levofloxacin; Metformin and related; Morphine; Morphine and related; Morphine sulfate er beads; Omeprazole; Spiriva handihaler [tiotropium bromide monohydrate]; Statins; Tetracyclines & related; and Ace inhibitors    Social History Social History   Tobacco Use  . Smoking status: Former Smoker    Years: 25.00    Types: Cigarettes  . Smokeless tobacco: Never Used  Substance Use Topics  . Alcohol use: No    Alcohol/week: 0.0 standard drinks  . Drug use: No    Review of Systems Patient denies headaches, rhinorrhea, blurry vision, numbness, shortness of breath, chest pain, edema, cough, abdominal pain, nausea, vomiting, diarrhea, dysuria, fevers, rashes or hallucinations unless otherwise stated above in HPI. ____________________________________________   PHYSICAL EXAM:  VITAL SIGNS: Vitals:   03/18/18 1300 03/18/18 1330  BP: 129/68 (!) 131/49  Pulse: 90 96  Resp: (!) 30 (!) 30  Temp:    SpO2: (!) 70% 99%    Constitutional: Alert oriented x 2 , chronically ill appearins  Eyes: Conjunctivae are normal.  Head: Atraumatic. Nose: No congestion/rhinnorhea. Mouth/Throat: Mucous membranes are moist.   Neck: No stridor. Painless ROM.  Cardiovascular: Normal rate, regular rhythm. Grossly normal heart sounds.  Good peripheral circulation. Respiratory: Normal respiratory effort.  No retractions. Lungs CTAB. Gastrointestinal: Soft and nontender. + distention with fluid wave. No abdominal bruits. No CVA tenderness. Genitourinary:  Musculoskeletal: No lower extremity tenderness nor edema.  No joint  effusions. Neurologic:  + bilateral asterixis,  No  Other gross focal neurologic deficits are appreciated. No facial droop Skin:  Skin is warm, dry and intact. No rash noted. Psychiatric: Mood and affect are normal.  ____________________________________________   LABS (all labs ordered are listed, but only abnormal results are displayed)  Results for orders placed or performed during the hospital encounter of 03/18/18 (from the past 24 hour(s))  Urinalysis, Complete w Microscopic     Status: Abnormal   Collection Time: 03/18/18 11:00 AM  Result Value Ref Range   Color, Urine YELLOW (A) YELLOW   APPearance CLEAR (A) CLEAR   Specific Gravity, Urine 1.012 1.005 - 1.030   pH 5.0 5.0 - 8.0   Glucose, UA NEGATIVE NEGATIVE mg/dL   Hgb urine dipstick LARGE (A) NEGATIVE   Bilirubin Urine NEGATIVE NEGATIVE   Ketones, ur NEGATIVE NEGATIVE mg/dL   Protein, ur NEGATIVE NEGATIVE mg/dL   Nitrite NEGATIVE NEGATIVE   Leukocytes, UA NEGATIVE NEGATIVE   RBC / HPF 21-50 0 - 5 RBC/hpf   WBC, UA 0-5 0 - 5 WBC/hpf   Bacteria, UA NONE SEEN NONE SEEN   Squamous Epithelial / LPF NONE SEEN 0 - 5   Mucus PRESENT   CBC with Differential/Platelet     Status: Abnormal   Collection Time: 03/18/18  11:04 AM  Result Value Ref Range   WBC 5.1 3.6 - 11.0 K/uL   RBC 3.92 3.80 - 5.20 MIL/uL   Hemoglobin 10.7 (L) 12.0 - 16.0 g/dL   HCT 16.132.1 (L) 09.635.0 - 04.547.0 %   MCV 82.0 80.0 - 100.0 fL   MCH 27.3 26.0 - 34.0 pg   MCHC 33.3 32.0 - 36.0 g/dL   RDW 40.916.8 (H) 81.111.5 - 91.414.5 %   Platelets 76 (L) 150 - 440 K/uL   Neutrophils Relative % 82 %   Neutro Abs 4.2 1.4 - 6.5 K/uL   Lymphocytes Relative 7 %   Lymphs Abs 0.4 (L) 1.0 - 3.6 K/uL   Monocytes Relative 8 %   Monocytes Absolute 0.4 0.2 - 0.9 K/uL   Eosinophils Relative 2 %   Eosinophils Absolute 0.1 0 - 0.7 K/uL   Basophils Relative 1 %   Basophils Absolute 0.0 0 - 0.1 K/uL  Comprehensive metabolic panel     Status: Abnormal   Collection Time: 03/18/18 11:04 AM   Result Value Ref Range   Sodium 139 135 - 145 mmol/L   Potassium 4.6 3.5 - 5.1 mmol/L   Chloride 96 (L) 98 - 111 mmol/L   CO2 33 (H) 22 - 32 mmol/L   Glucose, Bld 163 (H) 70 - 99 mg/dL   BUN 68 (H) 8 - 23 mg/dL   Creatinine, Ser 7.822.04 (H) 0.44 - 1.00 mg/dL   Calcium 9.5 8.9 - 95.610.3 mg/dL   Total Protein 6.3 (L) 6.5 - 8.1 g/dL   Albumin 3.5 3.5 - 5.0 g/dL   AST 38 15 - 41 U/L   ALT 27 0 - 44 U/L   Alkaline Phosphatase 89 38 - 126 U/L   Total Bilirubin 1.2 0.3 - 1.2 mg/dL   GFR calc non Af Amer 23 (L) >60 mL/min   GFR calc Af Amer 27 (L) >60 mL/min   Anion gap 10 5 - 15  Troponin I     Status: None   Collection Time: 03/18/18 11:04 AM  Result Value Ref Range   Troponin I <0.03 <0.03 ng/mL  Protime-INR     Status: Abnormal   Collection Time: 03/18/18 11:04 AM  Result Value Ref Range   Prothrombin Time 15.6 (H) 11.4 - 15.2 seconds   INR 1.25   Ammonia     Status: Abnormal   Collection Time: 03/18/18 11:04 AM  Result Value Ref Range   Ammonia 74 (H) 9 - 35 umol/L  Blood gas, venous     Status: Abnormal (Preliminary result)   Collection Time: 03/18/18 12:18 PM  Result Value Ref Range   pH, Ven 7.41 7.250 - 7.430   pCO2, Ven 61 (H) 44.0 - 60.0 mmHg   pO2, Ven PENDING 32.0 - 45.0 mmHg   Patient temperature 37.0    Collection site VENOUS    Sample type VENOUS    ____________________________________________  EKG My review and personal interpretation at Time: 10:48   Indication: weakness  Rate: 100  Rhythm: sinus Axis: normal Other: normal intervals, no stemi ____________________________________________  RADIOLOGY  I personally reviewed all radiographic images ordered to evaluate for the above acute complaints and reviewed radiology reports and findings.  These findings were personally discussed with the patient.  Please see medical record for radiology report.  ____________________________________________   PROCEDURES  Procedure(s) performed:  .Critical  Care Performed by: Willy Eddyobinson, Diannia Hogenson, MD Authorized by: Willy Eddyobinson, Augusto Deckman, MD   Critical care provider statement:    Critical care  time (minutes):  40   Critical care time was exclusive of:  Separately billable procedures and treating other patients   Critical care was necessary to treat or prevent imminent or life-threatening deterioration of the following conditions:  Metabolic crisis   Critical care was time spent personally by me on the following activities:  Development of treatment plan with patient or surrogate, discussions with consultants, evaluation of patient's response to treatment, examination of patient, obtaining history from patient or surrogate, ordering and performing treatments and interventions, ordering and review of laboratory studies, ordering and review of radiographic studies, pulse oximetry, re-evaluation of patient's condition and review of old charts      Critical Care performed: yes ____________________________________________   INITIAL IMPRESSION / ASSESSMENT AND PLAN / ED COURSE  Pertinent labs & imaging results that were available during my care of the patient were reviewed by me and considered in my medical decision making (see chart for details).   DDX: Dehydration, sepsis, pna, uti, hypoglycemia, cva, drug effect, withdrawal, encephalitis   Angela Austin is a 72 y.o. who presents to the ED with symptoms weakness and confusion as described above.  Patient protecting her airway.  Complex presentation given her underlying liver disease and chronic medical conditions.  Patient is AFVSS in ED. Exam as above. Given current presentation have considered the above differential.  The patient will be placed on continuous pulse oximetry and telemetry for monitoring.  Laboratory evaluation will be sent to evaluate for the above complaints.     Clinical Course as of Mar 18 1434  Sun Mar 18, 2018  1247 Patient symptoms best explained by hepatic encephalopathy.   We will continue with lactulose.  Based on her weakness and deterioration do feel patient will require hospitalization for further evaluation management.   [PR]    Clinical Course User Index [PR] Willy Eddy, MD     As part of my medical decision making, I reviewed the following data within the electronic MEDICAL RECORD NUMBER Nursing notes reviewed and incorporated, Labs reviewed, notes from prior ED visits.   ____________________________________________   FINAL CLINICAL IMPRESSION(S) / ED DIAGNOSES  Final diagnoses:  Hepatic encephalopathy (HCC)  Generalized weakness  Cirrhosis, non-alcoholic (HCC)      NEW MEDICATIONS STARTED DURING THIS VISIT:  New Prescriptions   No medications on file     Note:  This document was prepared using Dragon voice recognition software and may include unintentional dictation errors.    Willy Eddy, MD 03/18/18 1435

## 2018-03-18 NOTE — ED Notes (Signed)
Pt taken to the floor to give bedside report to RObin, RCharity fundraiser

## 2018-03-18 NOTE — ED Notes (Signed)
Pt had a loose  Stool   some redness noticed in sacral in lower sacral area - pt has bandage already in place  r buttock  area  Pt cleaned and turned on side

## 2018-03-18 NOTE — Progress Notes (Signed)
Family Meeting Note  Advance Directive:yes  Today a meeting took place with the Patient's family   Patient is unable to participate due UU:[id number]:Lacked capacity delerious   The following clinical team members were present during this meeting:MD  The following were discussed:Patient's diagnosis: Acute encephalopathy, hepatic encephalopathy, liver [place], diabetes mellitus, hypothyroidism, the plan of care discussed with the patient and other comorbidities are also discussed    Cirrhosis of liver not due to alcohol (HCC)   . COPD (chronic obstructive pulmonary disease) (HCC)   . Depression   . Diabetes (HCC)   . GERD (gastroesophageal reflux disease)   . Hypothyroidism   . ITP (idiopathic thrombocytopenic purpura)   . Nephrolithiasis       patient's progosis: Unable to determine and Goals for treatment: DNR  Additional follow-up to be provided: Hospitalist and  palliative care   Time spent during discussion:17 min  Ramonita LabAruna Aelyn Stanaland, MD

## 2018-03-18 NOTE — ED Notes (Signed)
According to stepfather and primary care giver patient has been weak    And having problems getting out of bed

## 2018-03-18 NOTE — ED Triage Notes (Signed)
Pt lives alone  Reports feeling weak this episode  X 1 week  difficulting urinating Dribbling  Of urination

## 2018-03-18 NOTE — H&P (Signed)
[facility] - [place] at [facility]   PATIENT NAME: Angela Austin    MR#:  [id number]  DATE OF BIRTH:  [birth year]  DATE OF ADMISSION:  03/18/2018  PRIMARY CARE PHYSICIAN: Kandyce Rud, MD   REQUESTING/REFERRING PHYSICIAN: Willy Eddy  CHIEF COMPLAINT:   Confusion HISTORY OF PRESENT ILLNESS:  Angela Austin  is a 72 y.o. female with a known history of liver cirrhosis secondary to Angela Austin, diabetes metas, GERD, hypothyroidism, diabetes thrombus cytopenic purpura sees Dr. Orlie Dakin as an outpatient and following Dr. Allegra Lai offers liver cirrhosis is brought in to the ED as she is altered.  Ammonia level is at 73.  Patient was given 1 dose of lactulose and following that she had a bowel movement and during my examination she started answering few questions.  According to the family members patient is feeling very weak and unable to get out of the bed for the past few days Denies any fever, nausea or vomiting.  No abdominal pain.  PAST MEDICAL HISTORY:   Past Medical History:  Diagnosis Date  . Cirrhosis of liver not due to alcohol (HCC)   . COPD (chronic obstructive pulmonary disease) (HCC)   . Depression   . Diabetes (HCC)   . GERD (gastroesophageal reflux disease)   . Hypothyroidism   . ITP (idiopathic thrombocytopenic purpura)   . Nephrolithiasis     PAST SURGICAL HISTOIRY:   Past Surgical History:  Procedure Laterality Date  . CATARACT EXTRACTION    . ESOPHAGOGASTRODUODENOSCOPY N/A 12/31/2017   Procedure: ESOPHAGOGASTRODUODENOSCOPY (EGD);  Surgeon: Toney Reil, MD;  Location: [facility] ENDOSCOPY;  Service: Gastroenterology;  Laterality: N/A;  . ESOPHAGOGASTRODUODENOSCOPY (EGD) WITH PROPOFOL N/A 01/25/2018   Procedure: ESOPHAGOGASTRODUODENOSCOPY (EGD) WITH PROPOFOL;  Surgeon: Toney Reil, MD;  Location: [facility] ENDOSCOPY;  Service: Gastroenterology;  Laterality: N/A;  . IR RADIOLOGIST EVAL & MGMT  02/15/2018  . LUMBAR DISC SURGERY     . TONSILLECTOMY AND ADENOIDECTOMY      SOCIAL HISTORY:   Social History   Tobacco Use  . Smoking status: Former Smoker    Years: 25.00    Types: Cigarettes  . Smokeless tobacco: Never Used  Substance Use Topics  . Alcohol use: No    Alcohol/week: 0.0 standard drinks    FAMILY HISTORY:   Family History  Problem Relation Age of Onset  . Alzheimer's disease Mother   . COPD Mother   . Cancer Mother   . COPD Father   . Diabetes Mellitus II Father   . Heart attack Father     DRUG ALLERGIES:   Allergies  Allergen Reactions  . Metformin Diarrhea  . Amoxicillin-Pot Clavulanate Other (See Comments)    Other reaction(s): Unknown  . Codeine   . Erythromycin   . Erythromycin Ethylsuccinate     Other reaction(s): Unknown  . Levofloxacin Other (See Comments)    Jittery, nausea  . Metformin And Related     GI bleed  . Morphine Other (See Comments) and Nausea And Vomiting    caused severe cramping   . Morphine And Related   . Morphine Sulfate Er Beads Other (See Comments)    caused severe cramping   . Omeprazole Other (See Comments)    GI side effects GI side effects  . Spiriva Handihaler [Tiotropium Bromide Monohydrate] Swelling    Tongue  . Statins Other (See Comments)    Other reaction(s): Muscle Pain  . Tetracyclines & Related Other (See Comments)  . Ace Inhibitors Rash  REVIEW OF SYSTEMS:  Limited review of systems   CONSTITUTIONAL: No fever, fatigue , patient is reporting extremely weak EYES: No blurred or double vision.  EARS, NOSE, AND THROAT: No tinnitus or ear pain.  RESPIRATORY: No cough, shortness of breath, wheezing or hemoptysis.  CARDIOVASCULAR: No chest pain, orthopnea, edema.  GASTROINTESTINAL: No nausea, vomiting, diarrhea or abdominal pain.  HEMATOLOGY: No anemia, easy bruising or bleeding  NEUROLOGIC: No tingling, numbness, reporting generalized weakness.    MEDICATIONS AT HOME:   Prior to Admission medications   Medication Sig Start  Date End Date Taking? Authorizing Provider  albuterol (ACCUNEB) 1.25 MG/3ML nebulizer solution Take 1 ampule by nebulization every 6 (six) hours as needed for wheezing.   Yes [provider]  albuterol (PROVENTIL HFA) 108 (90 Base) MCG/ACT inhaler Inhale 1 puff into the lungs every 4 (four) hours as needed.    Yes [provider]  FLUoxetine (PROZAC) 20 MG tablet Take 20 mg by mouth daily.   Yes [provider]  fluticasone (FLONASE) 50 MCG/ACT nasal spray Place 1-2 sprays into both nostrils daily.    Yes [provider]  furosemide (LASIX) 40 MG tablet Take 1 tablet (40 mg total) by mouth daily. 01/03/18  Yes Angela Austin, Sital, MD  Angela Austin (DIABETA) 2.5 MG tablet Take 1 tablet (2.5 mg total) by mouth daily with breakfast. 01/02/18 01/02/19 Yes Mody, Patricia PesaSital, MD  levothyroxine (SYNTHROID, LEVOTHROID) 75 MCG tablet Take 75 mcg by mouth daily before breakfast.  01/07/16  Yes [provider]  loratadine (CLARITIN) 10 MG tablet Take 10 mg by mouth daily as needed for allergies.   Yes [provider]  pantoprazole (PROTONIX) 40 MG tablet Take 40 mg by mouth 2 (two) times daily.   Yes [provider]  spironolactone (ALDACTONE) 100 MG tablet Take 1 tablet (100 mg total) by mouth daily. 01/03/18  Yes Angela Austin, Sital, MD  albumin human 25 % bottle Give 25g of albumin per 4 liters of fluid removed max 50g Patient not taking: Reported on 03/18/2018 01/15/18   Toney ReilVanga, Rohini Reddy, MD      VITAL SIGNS:  Blood pressure (!) 131/49, pulse 96, temperature 98.1 F (36.7 C), temperature source Oral, resp. rate (!) 30, height 5\' 5"  (1.651 m), weight 42.5 kg, SpO2 99 %.  PHYSICAL EXAMINATION:  GENERAL:  72 y.o.-year-old patient lying in the bed with no acute distress.  EYES: Pupils equal, round, reactive to light and accommodation. No scleral icterus. Extraocular muscles intact.  HEENT: Head atraumatic, normocephalic. Oropharynx and nasopharynx clear.  NECK:  Supple, no  jugular venous distention. No thyroid enlargement, no tenderness.  LUNGS: Normal breath sounds bilaterally, no wheezing, rales,rhonchi or crepitation. No use of accessory muscles of respiration.  CARDIOVASCULAR: S1, S2 normal. No murmurs, rubs, or gallops.  ABDOMEN: Soft, nontender, nondistended. Bowel sounds present.  EXTREMITIES: No pedal edema, cyanosis, or clubbing.  NEUROLOGIC: Awake and alert and oriented x2 sensation intact. Gait not checked.  PSYCHIATRIC: The patient is alert and oriented x 2  SKIN: No obvious rash, lesion, or ulcer.   LABORATORY PANEL:   CBC Recent Labs  Lab 03/18/18 1104  WBC 5.1  HGB 10.7*  HCT 32.1*  PLT 76*   ------------------------------------------------------------------------------------------------------------------  Chemistries  Recent Labs  Lab 03/18/18 1104  NA 139  K 4.6  CL 96*  CO2 33*  GLUCOSE 163*  BUN 68*  CREATININE 2.04*  CALCIUM 9.5  AST 38  ALT 27  ALKPHOS 89  BILITOT 1.2   ------------------------------------------------------------------------------------------------------------------  Cardiac Enzymes Recent Labs  Lab 03/18/18 1104  TROPONINI <0.03   ------------------------------------------------------------------------------------------------------------------  RADIOLOGY:  Dg Chest Portable 1 View  Result Date: 03/18/2018 CLINICAL DATA:  Generalized weakness.  No chest complaints. EXAM: PORTABLE CHEST 1 VIEW COMPARISON:  Chest x-ray dated December 30, 2017. FINDINGS: The heart size and mediastinal contours are within normal limits. Normal pulmonary vascularity. Atherosclerotic calcification of the aortic arch. The lungs remain hyperinflated with emphysematous changes. No focal consolidation, pleural effusion, or pneumothorax. No acute osseous abnormality. IMPRESSION: COPD.  No active disease. Electronically Signed   By: Obie DredgeWilliam T Derry M.D.   On: 03/18/2018 12:03    EKG:   Orders placed or performed during the  hospital encounter of 03/18/18  . ED EKG  . ED EKG    IMPRESSION AND PLAN:   Angela SituJackie Austin  is a 72 y.o. female with a known history of liver cirrhosis secondary to [place], diabetes metas, GERD, hypothyroidism, diabetes thrombus cytopenic purpura sees Dr. Orlie DakinFinnegan as an outpatient and following Dr. Allegra LaiVanga offers liver cirrhosis is brought in to the ED as she is altered.  Ammonia level is at 73.  Patient was given 1 dose of lactulose and following that she had a bowel movement and during my examination she started answering few questions.  #Altered mental status secondary to hepatic encephalopathy Admit to MedSurg unit Lactulose, will titrate the dose until patient has 2 soft bowel movements A.m. Labs Patient was seen by Dr. Allegra LaiVanga in the past.  Will consult if needed  #Hepatic encephalopathy [facility] Get neurochecks and monitor patient clinically Continue lactulose Patient is on LasiX and Aldactone will continue the same  # hypothyroidism continue Synthyroid  #Diabetes mellitus non-insulin-dependent Insulin sliding scale with Accu-Cheks  #History of idiopathic thrombocytopenic purpura Some bruises are noticed on the skin but no active bleeding.  Continue to monitor platelet count  # failure to thrive Patient is unable to get out of the bed Consult palliative care and physical therapy  CODE STATUS DO NOT RESUSCITATE  All the records are reviewed and case discussed with ED provider. Management plans discussed with the patient, family and they are in agreement.   TOTAL TIME TAKING CARE OF THIS PATIENT: 43 minutes.   Note: This dictation was prepared with Dragon dictation along with smaller phrase technology. Any transcriptional errors that result from this process are unintentional.  Ramonita LabAruna Kirbi Farrugia M.D on 03/18/2018 at 2:18 PM  Between 7am to 6pm - Pager - [phone]  After 6pm go to [email or web address] - password [email or web address] [facility]  [facility]  Office  [phone]  CC: Primary care physician; Kandyce RudBabaoff, Marcus, MD

## 2018-03-18 NOTE — ED Notes (Signed)
Dr. Gouru at bedside  

## 2018-03-19 LAB — GLUCOSE, CAPILLARY
GLUCOSE-CAPILLARY: 159 mg/dL — AB (ref 70–99)
GLUCOSE-CAPILLARY: 138 mg/dL — AB (ref 70–99)
Glucose-Capillary: 155 mg/dL — ABNORMAL HIGH (ref 70–99)
Glucose-Capillary: 119 mg/dL — ABNORMAL HIGH (ref 70–99)

## 2018-03-19 LAB — COMPREHENSIVE METABOLIC PANEL
ALK PHOS: 92 U/L (ref 38–126)
ALT: 26 U/L (ref 0–44)
AST: 34 U/L (ref 15–41)
Albumin: 2.9 g/dL — ABNORMAL LOW (ref 3.5–5.0)
Anion gap: 9 (ref 5–15)
BILIRUBIN TOTAL: 1.2 mg/dL (ref 0.3–1.2)
BUN: 63 mg/dL — ABNORMAL HIGH (ref 8–23)
CALCIUM: 9 mg/dL (ref 8.9–10.3)
CO2: 32 mmol/L (ref 22–32)
CREATININE: 1.91 mg/dL — AB (ref 0.44–1.00)
Chloride: 98 mmol/L (ref 98–111)
GFR calc non Af Amer: 25 mL/min — ABNORMAL LOW (ref >60)
GFR, EST AFRICAN AMERICAN: 29 mL/min — AB (ref >60)
GLUCOSE: 178 mg/dL — AB (ref 70–99)
Potassium: 4.5 mmol/L (ref 3.5–5.1)
Sodium: 139 mmol/L (ref 135–145)
TOTAL PROTEIN: 5.4 g/dL — AB (ref 6.5–8.1)

## 2018-03-19 LAB — TSH: TSH: 35.406 u[IU]/mL — AB (ref 0.350–4.500)

## 2018-03-19 LAB — AMMONIA: Ammonia: 77 umol/L — ABNORMAL HIGH (ref 9–35)

## 2018-03-19 MED ORDER — BISACODYL 10 MG RE SUPP
10.0000 mg | Freq: Every day | RECTAL | Status: DC
  Filled 2018-03-19 (×3): qty 1

## 2018-03-19 NOTE — Evaluation (Signed)
Physical Therapy Evaluation Patient Details Name: Angela Austin MRN: [id number] DOB: [birth year] Today's Date: 03/19/2018   History of Present Illness  Pt is a 72 y.o. female presenting to hospital 03/18/18 with increased weakness and AMS.  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  PMH includes nonalcoholic cirrhosis, COPD, ITP, depression, DM, Alzheimer's disease.  Clinical Impression  Prior to hospital admission, pt was ambulatory short household distances with 4WW; uses 2 L home O2.  Pt lives with her 72 y.o. stepfather who is pt's caregiver.  Currently pt is SBA semi-supine to sit; min assist with transfers; and min assist walking a few feet bed to recliner with youth sized RW.  SOB noted with limited activity.  Impaired L DF strength and L DF ROM noted (nurse notified).  Pt would benefit from skilled PT to address noted impairments and functional limitations (see below for any additional details).  Pt reports Palliative Care consult and discussion of hospice.  To discharge home safely, recommend pt have 24/7 physical assist for safety with functional mobility and also HHPT; also see recommended DME below for safe hospital discharge to home setting (anticipate d/t pt's weakness and limited activity tolerance, pt would be safest at w/c level functional mobility).    Follow Up Recommendations Home health PT;Supervision/Assistance - 24 hour    Equipment Recommendations  3in1 (PT);Wheelchair (measurements PT);Wheelchair cushion (measurements PT)(youth sized RW)    Recommendations for Other Services       Precautions / Restrictions Precautions Precautions: Fall Precaution Comments: Bruises easily Restrictions Weight Bearing Restrictions: No      Mobility  Bed Mobility Overal bed mobility: Needs Assistance Bed Mobility: Supine to Sit     Supine to sit: Supervision;HOB elevated     General bed mobility comments: increased effort and time for pt to perform on own; SBA for safety  Transfers Overall transfer level: Needs assistance Equipment used: Rolling walker (2 wheeled)(youth sized) Transfers: Sit to/from Stand Sit to Stand: Min assist         General transfer comment: vc's for UE and LE placement; min assist to initiate and come to full stand  Ambulation/Gait Ambulation/Gait assistance: Min assist Gait Distance (Feet): 3 Feet(bed to recliner) Assistive device: Rolling walker (2 wheeled)(youth sized)   Gait velocity: decreased   General Gait Details: pt initially with difficulty initiating steps but with vc's for stepping pattern and increasing UE support through walker pt able to walk a few feet bed to recliner; SOB noted with activity  Stairs            Wheelchair Mobility    Modified Rankin (Stroke Patients Only)       Balance Overall balance assessment: Needs assistance Sitting-balance support: Single extremity supported;Feet supported Sitting balance-Leahy Scale: Poor Sitting balance - Comments: pt requires at least single UE support for static sitting balance   Standing balance support: Single extremity supported Standing balance-Leahy Scale: Poor Standing balance comment: pt requires at least single UE support on RW for static standing balance once set up (pt initially with B LE's against bed in standing for stability standing with RW)                             Pertinent Vitals/Pain Pain Assessment: No/denies pain  Vitals (HR and O2 on 2 L via nasal cannula) stable and WFL throughout treatment session.    Home Living  Family/patient expects to be discharged to:: Private residence Living Arrangements: Other relatives(Step father 53(91 y.o.) who is pt's caregiver ) Available Help at Discharge: Family Type of Home: Other(Comment)(Townhouse) Home Access: Level entry     Home Layout: One level Home Equipment: Walker -  4 wheels;Bedside commode;Shower seat;Other (comment)(adjustable bed; 2 L home O2 chronic)      Prior Function Level of Independence: Needs assistance   Gait / Transfers Assistance Needed: Pt reports ambulating short household distances with 4ww with seat.  ADL's / Homemaking Assistance Needed: Requires assist with ADL's/IADL's        Hand Dominance        Extremity/Trunk Assessment   Upper Extremity Assessment Upper Extremity Assessment: Generalized weakness    Lower Extremity Assessment Lower Extremity Assessment: Generalized weakness;LLE deficits/detail LLE Deficits / Details: DF 2+/5; L ankle DF ROM grossly 10-15 degrees below neutral    Cervical / Trunk Assessment Cervical / Trunk Assessment: Normal  Communication   Communication: No difficulties  Cognition Arousal/Alertness: Awake/alert Behavior During Therapy: Anxious Overall Cognitive Status: Within Functional Limits for tasks assessed                                        General Comments   Nursing cleared pt for participation in physical therapy.  Pt agreeable to PT session.  Pt's daughter and pt's step-father present during session.    Exercises  Transfer training   Assessment/Plan    PT Assessment Patient needs continued PT services  PT Problem List Decreased strength;Decreased range of motion;Decreased activity tolerance;Decreased balance;Decreased mobility;Decreased knowledge of use of DME;Decreased knowledge of precautions       PT Treatment Interventions DME instruction;Gait training;Functional mobility training;Therapeutic activities;Therapeutic exercise;Balance training;Patient/family education    PT Goals (Current goals can be found in the Care Plan section)  Acute Rehab PT Goals Patient Stated Goal: to feel stronger PT Goal Formulation: With patient Time For Goal Achievement: 04/02/18 Potential to Achieve Goals: Fair    Frequency Min 2X/week   Barriers to discharge         Co-evaluation               AM-PAC PT "6 Clicks" Daily Activity  Outcome Measure Difficulty turning over in bed (including adjusting bedclothes, sheets and blankets)?: A Little Difficulty moving from lying on back to sitting on the side of the bed? : A Lot Difficulty sitting down on and standing up from a chair with arms (e.g., wheelchair, bedside commode, etc,.)?: Unable Help needed moving to and from a bed to chair (including a wheelchair)?: A Little Help needed walking in hospital room?: A Little Help needed climbing 3-5 steps with a railing? : Total 6 Click Score: 13    End of Session Equipment Utilized During Treatment: Gait belt Activity Tolerance: Patient limited by fatigue;Other (comment)(Limited d/t SOB with activity) Patient left: in chair;with call bell/phone within reach;with chair alarm set;with family/visitor present Nurse Communication: Mobility status;Precautions PT Visit Diagnosis: Unsteadiness on feet (R26.81);Other abnormalities of gait and mobility (R26.89);Muscle weakness (generalized) (M62.81)    Time: [id number] PT Time Calculation (min) (ACUTE ONLY): 32 min   Charges:   PT Evaluation $PT Eval Low Complexity: 1 Low PT Treatments $Therapeutic Activity: 8-22 mins       Hendricks LimesEmily Adrieana Fennelly, PT 03/19/18, 4:46 PM [phone]

## 2018-03-19 NOTE — Plan of Care (Signed)
  Problem: Education: Goal: Knowledge of General Education information will improve Description Including pain rating scale, medication(s)/side effects and non-pharmacologic comfort measures Outcome: Progressing   Problem: Health Behavior/Discharge Planning: Goal: Ability to manage health-related needs will improve Outcome: Progressing   Problem: Clinical Measurements: Goal: Ability to maintain clinical measurements within normal limits will improve Outcome: Progressing Goal: Will remain free from infection Outcome: Progressing Goal: Diagnostic test results will improve Outcome: Progressing Goal: Respiratory complications will improve Outcome: Progressing Goal: Cardiovascular complication will be avoided Outcome: Progressing   Problem: Activity: Goal: Risk for activity intolerance will decrease Outcome: Progressing   Problem: Nutrition: Goal: Adequate nutrition will be maintained Outcome: Progressing   Problem: Coping: Goal: Level of anxiety will decrease Outcome: Progressing   Problem: Elimination: Goal: Will not experience complications related to bowel motility Outcome: Progressing Goal: Will not experience complications related to urinary retention Outcome: Progressing   Problem: Pain Managment: Goal: General experience of comfort will improve Outcome: Progressing   Problem: Safety: Goal: Ability to remain free from injury will improve Outcome: Progressing   Problem: Skin Integrity: Goal: Risk for impaired skin integrity will decrease Outcome: Progressing  PALL TO SEE  POSSIBLE HOSPICE SERVICES

## 2018-03-19 NOTE — Progress Notes (Signed)
Consult received and chart reviewed. This NP will meet with patient and daughter 8/20 around 10am for initial GOC conversation. Thank you.   NO CHARGE  Vennie HomansMegan Magaret Justo, FNP-C Palliative Medicine Team  Phone: [phone] Fax: [phone]

## 2018-03-19 NOTE — Progress Notes (Signed)
Family Meeting Note  Advance Directive:yes  Today a meeting took place with the Patient and Daughter at bedside.  Patient is unable to participate due WU:[id number]:Lacked capacity Mental state   The following clinical team members were present during this meeting:MD  The following were discussed:Patient's diagnosis: 72 y.o. female with a known history of liver cirrhosis secondary to Nash, diabetes metas, GERD, hypothyroidism, diabetes thrombus cytopenic purpura sees Dr. Orlie DakinFinnegan as an outpatient and following Dr. Allegra LaiVanga offers liver cirrhosis is brought in to the ED as she is altered.  Ammonia level is at 77 now.    Past Medical History:  Diagnosis Date  . Cirrhosis of liver not due to alcohol (HCC)   . COPD (chronic obstructive pulmonary disease) (HCC)   . Depression   . Diabetes (HCC)   . GERD (gastroesophageal reflux disease)   . Hypothyroidism   . ITP (idiopathic thrombocytopenic purpura)   . Nephrolithiasis       Patient's progosis: < 12 months and Goals for treatment: DNR, poor prognosis  Additional follow-up to be provided: Palliative care eval - consider Hospice at Home vs HHPT with Palliative care to follow  Time spent during discussion:20 minutes  Angela LovettVipul Yardley Beltran, MD

## 2018-03-19 NOTE — Plan of Care (Signed)
Pt is very weak and unable to ambulate on her own. VSS, denies any major pain all night. Had uneventful night. Care plan reviewed with pt.

## 2018-03-19 NOTE — Progress Notes (Signed)
[facility] [place] [facility]   PATIENT NAME: Angela Austin Petrasek    MR#:  [id number]  DATE OF BIRTH:  October 29, 1945  SUBJECTIVE:  CHIEF COMPLAINT:   Chief Complaint  Patient presents with  . Weakness  Lethargic, confused, REVIEW OF SYSTEMS:  Review of Systems  Unable to perform ROS: Acuity of condition    DRUG ALLERGIES:   Allergies  Allergen Reactions  . Metformin Diarrhea  . Amoxicillin-Pot Clavulanate Other (See Comments)    Other reaction(s): Unknown  . Codeine   . Erythromycin   . Erythromycin Ethylsuccinate     Other reaction(s): Unknown  . Levofloxacin Other (See Comments)    Jittery, nausea  . Metformin And Related     GI bleed  . Morphine Other (See Comments) and Nausea And Vomiting    caused severe cramping   . Morphine And Related   . Morphine Sulfate Er Beads Other (See Comments)    caused severe cramping   . Omeprazole Other (See Comments)    GI side effects GI side effects  . Spiriva Handihaler [Tiotropium Bromide Monohydrate] Swelling    Tongue  . Statins Other (See Comments)    Other reaction(s): Muscle Pain  . Tetracyclines & Related Other (See Comments)  . Ace Inhibitors Rash   VITALS:  Blood pressure (!) 148/53, pulse 91, temperature 97.7 F (36.5 C), temperature source Oral, resp. rate 20, height 5' (1.524 m), weight 40.1 kg, SpO2 100 %. PHYSICAL EXAMINATION:  Physical Exam  Constitutional: She is oriented to person, place, and time. She appears lethargic. She appears cachectic.  HENT:  Head: Normocephalic and atraumatic.  Eyes: Pupils are equal, round, and reactive to light. Conjunctivae and EOM are normal.  Neck: Normal range of motion. Neck supple. No tracheal deviation present. No thyromegaly present.  Cardiovascular: Normal rate, regular rhythm and normal heart sounds.  Pulmonary/Chest: Effort normal and breath sounds normal. No respiratory distress. She has no wheezes. She exhibits no tenderness.  Abdominal:  Soft. She exhibits ascites. She exhibits no distension. Bowel sounds are decreased. There is tenderness.  Musculoskeletal: Normal range of motion.  Neurological: She is oriented to person, place, and time. She appears lethargic. No cranial nerve deficit.  Skin: Skin is warm and dry. No rash noted.   LABORATORY PANEL:  Female CBC Recent Labs  Lab 03/18/18 1104  WBC 5.1  HGB 10.7*  HCT 32.1*  PLT 76*   ------------------------------------------------------------------------------------------------------------------ Chemistries  Recent Labs  Lab 03/19/18 0446  NA 139  K 4.5  CL 98  CO2 32  GLUCOSE 178*  BUN 63*  CREATININE 1.91*  CALCIUM 9.0  AST 34  ALT 26  ALKPHOS 92  BILITOT 1.2   RADIOLOGY:  No results found. ASSESSMENT AND PLAN:  Angela Austin [place]  is a 72 y.o. female with a known history of liver cirrhosis secondary to Nash, diabetes metas, GERD, hypothyroidism, diabetes thrombus cytopenic purpura sees Dr. Orlie DakinFinnegan as an outpatient and following Dr. Allegra LaiVanga offers liver cirrhosis is brought in to the ED as she is altered.  Ammonia level is at 73.  Patient was given 1 dose of lactulose and following that she had a bowel movement and during my examination she started answering few questions.  # Acute on chronic hepatic encephalopathy - continue Lactulose, will titrate the dose until patient has 2 soft bowel movements - Ammonia 77 (74), monitor  - Hepatic encephalopathy from liver cirrhosis [facility] Get neurochecks and monitor patient clinically Continue lactulose, LasiX  and Aldactone for now  # hypothyroidism continue Synthyroid, check TSH  #Diabetes mellitus non-insulin-dependent Insulin sliding scale with Accu-Cheks - DM nurse c/s  #History of idiopathic thrombocytopenic purpura Some bruises are noticed on the skin but no active bleeding.  Continue to monitor platelet count, platelets 76  # failure to thrive Patient is unable to get out of  the bed  palliative care meeting tomorrow  physical therapy recommends HHPT     All the records are reviewed and case discussed with Care Management/Social Worker. Management plans discussed with the patient, family (daughter at bedside) and they are in agreement.  CODE STATUS: DNR  TOTAL TIME TAKING CARE OF THIS PATIENT: 20 minutes.   More than 50% of the time was spent in counseling/coordination of care: YES  POSSIBLE D/C IN 1-2 DAYS, DEPENDING ON CLINICAL CONDITION.   Delfino LovettVipul Robertson Colclough M.D on 03/19/2018 at 6:43 PM  Between 7am to 6pm - Pager - [phone]  After 6pm go to [email or web address] - Social research officer, governmentpassword [email or web address]  [facility] [place] [facility]  Office  [phone]  CC: Primary care physician; Kandyce RudBabaoff, Marcus, MD  Note: This dictation was prepared with Dragon dictation along with smaller phrase technology. Any transcriptional errors that result from this process are unintentional.

## 2018-03-20 DIAGNOSIS — Z515 Encounter for palliative care: Secondary | ICD-10-CM

## 2018-03-20 DIAGNOSIS — K7290 Hepatic failure, unspecified without coma: Secondary | ICD-10-CM

## 2018-03-20 DIAGNOSIS — E43 Unspecified severe protein-calorie malnutrition: Secondary | ICD-10-CM

## 2018-03-20 DIAGNOSIS — R531 Weakness: Secondary | ICD-10-CM

## 2018-03-20 DIAGNOSIS — K7460 Unspecified cirrhosis of liver: Secondary | ICD-10-CM

## 2018-03-20 DIAGNOSIS — R188 Other ascites: Secondary | ICD-10-CM

## 2018-03-20 DIAGNOSIS — Z7189 Other specified counseling: Secondary | ICD-10-CM

## 2018-03-20 LAB — GLUCOSE, CAPILLARY
GLUCOSE-CAPILLARY: 123 mg/dL — AB (ref 70–99)
GLUCOSE-CAPILLARY: 231 mg/dL — AB (ref 70–99)
Glucose-Capillary: 109 mg/dL — ABNORMAL HIGH (ref 70–99)
Glucose-Capillary: 155 mg/dL — ABNORMAL HIGH (ref 70–99)

## 2018-03-20 LAB — CBC
HCT: 27.6 % — ABNORMAL LOW (ref 35.0–47.0)
Hemoglobin: 9.1 g/dL — ABNORMAL LOW (ref 12.0–16.0)
MCH: 27.2 pg (ref 26.0–34.0)
MCHC: 33.1 g/dL (ref 32.0–36.0)
MCV: 82.2 fL (ref 80.0–100.0)
PLATELETS: 64 10*3/uL — AB (ref 150–440)
RBC: 3.36 MIL/uL — ABNORMAL LOW (ref 3.80–5.20)
RDW: 17 % — AB (ref 11.5–14.5)
WBC: 4.2 10*3/uL (ref 3.6–11.0)

## 2018-03-20 LAB — COMPREHENSIVE METABOLIC PANEL
ALT: 24 U/L (ref 0–44)
ANION GAP: 8 (ref 5–15)
AST: 31 U/L (ref 15–41)
Albumin: 3 g/dL — ABNORMAL LOW (ref 3.5–5.0)
Alkaline Phosphatase: 92 U/L (ref 38–126)
BILIRUBIN TOTAL: 1.3 mg/dL — AB (ref 0.3–1.2)
BUN: 64 mg/dL — ABNORMAL HIGH (ref 8–23)
CHLORIDE: 98 mmol/L (ref 98–111)
CO2: 32 mmol/L (ref 22–32)
Calcium: 8.9 mg/dL (ref 8.9–10.3)
Creatinine, Ser: 2.19 mg/dL — ABNORMAL HIGH (ref 0.44–1.00)
GFR, EST AFRICAN AMERICAN: 25 mL/min — AB (ref >60)
GFR, EST NON AFRICAN AMERICAN: 21 mL/min — AB (ref >60)
Glucose, Bld: 139 mg/dL — ABNORMAL HIGH (ref 70–99)
POTASSIUM: 4.4 mmol/L (ref 3.5–5.1)
Sodium: 138 mmol/L (ref 135–145)
TOTAL PROTEIN: 5.3 g/dL — AB (ref 6.5–8.1)

## 2018-03-20 LAB — AMMONIA: Ammonia: 101 umol/L — ABNORMAL HIGH (ref 9–35)

## 2018-03-20 MED ORDER — VITAMIN C 500 MG PO TABS
250.0000 mg | ORAL_TABLET | Freq: Two times a day (BID) | ORAL | Status: DC
  Administered 2018-03-20 – 2018-03-21 (×2): 250 mg via ORAL
  Filled 2018-03-20 (×4): qty 0.5

## 2018-03-20 MED ORDER — MIRTAZAPINE 15 MG PO TBDP
15.0000 mg | ORAL_TABLET | Freq: Every day | ORAL | Status: DC
  Administered 2018-03-20: 22:00:00 15 mg via ORAL
  Filled 2018-03-20 (×2): qty 1

## 2018-03-20 MED ORDER — LEVOTHYROXINE SODIUM 100 MCG PO TABS
100.0000 ug | ORAL_TABLET | Freq: Every day | ORAL | Status: DC
  Administered 2018-03-21: 08:00:00 100 ug via ORAL
  Filled 2018-03-20: qty 1

## 2018-03-20 MED ORDER — LEVOTHYROXINE SODIUM 25 MCG PO TABS
25.0000 ug | ORAL_TABLET | Freq: Once | ORAL | Status: AC
  Administered 2018-03-20: 20:00:00 25 ug via ORAL
  Filled 2018-03-20: qty 1

## 2018-03-20 MED ORDER — NEPRO/CARBSTEADY PO LIQD: 237.0000 mL | Freq: Two times a day (BID) | ORAL | Status: DC

## 2018-03-20 MED ORDER — HEPARIN SODIUM (PORCINE) 5000 UNIT/ML IJ SOLN: 5000.0000 [IU] | Freq: Three times a day (TID) | INTRAMUSCULAR | Status: DC

## 2018-03-20 MED ORDER — FOLIC ACID 1 MG PO TABS
1.0000 mg | ORAL_TABLET | Freq: Every day | ORAL | Status: DC
  Administered 2018-03-20 – 2018-03-21 (×2): 1 mg via ORAL
  Filled 2018-03-20 (×2): qty 1

## 2018-03-20 MED ORDER — LACTULOSE 10 GM/15ML PO SOLN: 30.0000 g | Freq: Two times a day (BID) | ORAL | Status: DC

## 2018-03-20 MED ORDER — ADULT MULTIVITAMIN W/MINERALS CH
1.0000 | ORAL_TABLET | Freq: Every day | ORAL | Status: DC
  Administered 2018-03-20: 20:00:00 1 via ORAL
  Filled 2018-03-20: qty 1

## 2018-03-20 MED ORDER — LACTULOSE 10 GM/15ML PO SOLN
30.0000 g | Freq: Three times a day (TID) | ORAL | Status: DC
  Administered 2018-03-20 – 2018-03-21 (×2): 30 g via ORAL
  Filled 2018-03-20 (×2): qty 60

## 2018-03-20 NOTE — Progress Notes (Signed)
Please note, patient is currently followed by out patient Palliative at home. CMRN Gweneth DimitriLisa Jacobs made aware. Dayna BarkerKaren Robertson RN, BSN, [facility], hospital Liaison [phone]

## 2018-03-20 NOTE — Progress Notes (Signed)
Initial palliative discussion with patient, daughter, son, and patient's step-father at bedside. Family initially was planning for home with hospice services with focus on comfort measures/medical management for cirrhosis. After further discussion this afternoon, daughter believes patient needs higher level of care in a facility (Patient lives with 72 yo step-father who is unable to care for her increasing needs). This NP discussed with physical therapy and social work. Patient likely not a candidate for aggressive physical therapy at rehab facility. Will further discuss with patient, family, and social worker in AM regarding long-term care placement in facility. Would be eligible for hospice in facility under LTC. Will follow.   Full note to follow.   NO CHARGE  Vennie HomansMegan Sherrick Araki, FNP-C Palliative Medicine Team  Phone: [phone] Fax: [phone]

## 2018-03-20 NOTE — Progress Notes (Signed)
PT Cancellation Note  Patient Details Name: Ihor DowJackie L Plaskett MRN: [id number] DOB: 27-Oct-1945   Cancelled Treatment:    Reason Eval/Treat Not Completed: Patient declined, no reason specified.  Pt reports having a "bad day" and can't get out of bed or do therapy today.  Family present in room and female family member also reporting pt having a bad day.  Will re-attempt PT treatment session at a later date/time.  Hendricks LimesEmily Samir Ishaq, PT 03/20/18, 2:58 PM [phone]

## 2018-03-20 NOTE — Consult Note (Signed)
Consultation Note Date: 03/20/18  Patient Name: Angela Austin  DOB: 1945-12-06  MRN: [id number]  Age / Sex: 72 y.o., female  PCP: Angela Late, MD Referring Physician: Vaughan Austin, *  Reason for Consultation: Establishing goals of care  HPI/Patient Profile: 72 y.o. 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 Ammonia level 73. And received 1 dose of lactulose in ED. Patient with acute on chronic hepatic encephalopathy secondary to NASH cirrhosis. Not a liver transplant candidate. GI and oncology notes reviewed. Palliative medicine consultation for goals of care.   Clinical Assessment and Goals of Care:  I have reviewed medical records, discussed with care team, and met with patient, daughter Angela Austin), son Angela Austin), and step-father Angela Austin) at bedside to discuss diagnosis, prognosis, GOC, EOL wishes, disposition and options.  Introduced Palliative Medicine as specialized medical care for people living with serious illness. It focuses on providing relief from the symptoms and stress of a serious illness. The goal is to improve quality of life for both the patient and the family.  Patient is awake, alert, oriented and able to participate in Angela Austin conversation. She starts our conversation by speaking of feeling "doom and gloom" every morning she wakes up. She shares that she is not eating and does not have an appetite. She is not sleeping. She denies pain or dyspnea. Her daughter shares that her mother is losing "will power to live."    We discussed a brief life review of the patient. Family shares that she has been busy and active throughout her life, working numerous different jobs. She has 3 children. Widowed. Diagnosed with NASH cirrhosis  about 5 years ago and has been followed by GI. Prior to hospitalization, living with 6yo step-father Angela Austin) who is here primary caregiver. Functionally, she has been declining. She is requiring more assist ADL's and even sitting on the side of her bed. She is depressed and takes Prozac.   Discussed course of hospital diagnoses and interventions. Patient and family understand she is NOT a candidate for liver transplant or TIPS. They understand her cirrhosis is chronic and progressive. Also that recurrent hospitalization secondary to ascites and elevated ammonia indicate disease progression.   I attempted to elicit values and goals of care important to the patient. Advanced directives, concepts specific to code status, artifical feeding and hydration, and rehospitalization were considered and discussed. Patient has a lawyer coming today at 11am to complete and notarize living will documentation. She is anxious and ready to get this over with. She confirms her wishes against heroic interventions including resuscitation and life support. Daughter asks about feeding tube since appetite is poor. We discussed starting low dose remeron that may help with sleep and appetite stimulation. Patient/family agreeable.   The difference between aggressive medical intervention and comfort care was considered in light of the patient's goals of care. Discussed utilization of medications for symptom management.  Introduced palliative and hospice philosophy and options. Patient and family agree that she will likely not benefit from skilled nursing for rehab. They are interested in taking her home with support from hospice. Caregiver list provided. Daughter is worried about Angela Austin being able to care for her mother, in his elderly state.   Questions and concerns were addressed.  Hard Choices booklet left for review. Introduced MOST form. Emotional/spiritual support provided.   **This afternoon, daughter and son approach me in  the hall and speak of their concern with her returning home with hospice services. They share that Angela Austin is elderly and it has become more challenging for him to care for Angela Austin with her declining functional status. Family considering nursing facility placement to either attempt rehab or under long-term care with addition of hospice services. Discussed with SW who will talk with family.     SUMMARY OF RECOMMENDATIONS    DNR/DNI. Continue current interventions and supportive care.   Patient's lawyer coming today to complete living will documentation with her and family.   Introduced MOST form. Patient has a durable DNR completed.   Symptom management--see below.   Introduced hospice philosophy and options. Family considering options. Initially were trying to take patient home with hospice and caregivers. After further discussion, children feel she needs higher level of care. SW to discuss SNF options with family.   If patient discharges to SNF under LTC, may receive hospice services with goal to focus on her comfort and symptom management.   PMT will follow.   Code Status/Advance Care Planning:  DNR  Symptom Management:   Remeron 35m PO HS  Continue Prozac 269mPO daily  Patient denies pain or dyspnea.  Palliative Prophylaxis:   Aspiration, Bowel Regimen, Delirium Protocol, Frequent Pain Assessment, Oral Care and Turn Reposition  Psycho-social/Spiritual:   Desire for further Chaplaincy support: yes  Additional Recommendations: Caregiving  Support/Resources, Compassionate Wean Education and Education on Hospice  Prognosis:   Unable to determine: Poor prognosis. Likely months with decompensated liver cirrhosis not a transplant candidate, recurrent ascites, elevated ammonia, and declining functional and nutritional status.   Discharge Planning: To Be Determined      Primary Diagnoses: Present on Admission: . Hepatic encephalopathy ([place]  I have reviewed the medical  record, interviewed the patient and family, and examined the patient. The following aspects are pertinent.  Past Medical History:  Diagnosis Date  . Cirrhosis of liver not due to alcohol ([facility]  . COPD (chronic obstructive pulmonary disease) ([facility]  . Depression   . Diabetes ([facility]  . GERD (gastroesophageal reflux disease)   . Hypothyroidism   . ITP (idiopathic thrombocytopenic purpura)   . Nephrolithiasis    Social History   Socioeconomic History  . Marital status: Widowed    Spouse name: Not on file  . Number of children: Not on file  . Years of education: Not on file  . Highest education level: Not on file  Occupational History  . Not on file  Social Needs  . Financial resource strain: Not on file  . Food insecurity:    Worry: Not on file    Inability: Not on file  . Transportation needs:    Medical: Not on file    Non-medical: Not on file  Tobacco Use  . Smoking status: Former Smoker    Years: 25.00    Types: Cigarettes  . Smokeless tobacco: Never Used  Substance and Sexual Activity  . Alcohol use: No  Alcohol/week: 0.0 standard drinks  . Drug use: No  . Sexual activity: Not Currently  Lifestyle  . Physical activity:    Days per week: Not on file    Minutes per session: Not on file  . Stress: Not on file  Relationships  . Social connections:    Talks on phone: Not on file    Gets together: Not on file    Attends religious service: Not on file    Active member of club or organization: Not on file    Attends meetings of clubs or organizations: Not on file    Relationship status: Not on file  Other Topics Concern  . Not on file  Social History Narrative  . Not on file   Family History  Problem Relation Age of Onset  . Alzheimer's disease Mother   . COPD Mother   . Cancer Mother   . COPD Father   . Diabetes Mellitus II Father   . Heart attack Father    Scheduled Meds: . bisacodyl  10 mg Rectal Daily  . feeding supplement (NEPRO CARB STEADY)  237  mL Oral BID BM  . FLUoxetine  20 mg Oral Daily  . fluticasone  1 spray Each Nare Daily  . folic acid  1 mg Oral Daily  . glyBURIDE  2.5 mg Oral Q breakfast  . insulin aspart  0-5 Units Subcutaneous QHS  . insulin aspart  0-9 Units Subcutaneous TID WC  . lactulose  30 g Oral TID  . levothyroxine  100 mcg Oral QAC breakfast  . mirtazapine  15 mg Oral QHS  . multivitamin with minerals  1 tablet Oral Daily  . pantoprazole  40 mg Oral BID  . spironolactone  100 mg Oral Daily  . triamcinolone ointment   Topical BID  . vitamin C  250 mg Oral BID   Continuous Infusions: PRN Meds:.albuterol, docusate sodium, loratadine, ondansetron **OR** ondansetron (ZOFRAN) IV Medications Prior to Admission:  Prior to Admission medications   Medication Sig Start Date End Date Taking? Authorizing Provider  albuterol (ACCUNEB) 1.25 MG/3ML nebulizer solution Take 1 ampule by nebulization every 6 (six) hours as needed for wheezing.   Yes [provider]  albuterol (PROVENTIL HFA) 108 (90 Base) MCG/ACT inhaler Inhale 1 puff into the lungs every 4 (four) hours as needed.    Yes [provider]  FLUoxetine (PROZAC) 20 MG tablet Take 20 mg by mouth daily.   Yes [provider]  fluticasone (FLONASE) 50 MCG/ACT nasal spray Place 1-2 sprays into both nostrils daily.    Yes [provider]  furosemide (LASIX) 40 MG tablet Take 1 tablet (40 mg total) by mouth daily. 01/03/18  Yes Bettey Costa, MD  glyBURIDE (DIABETA) 2.5 MG tablet Take 1 tablet (2.5 mg total) by mouth daily with breakfast. 01/02/18 01/02/19 Yes Mody, Ulice Bold, MD  levothyroxine (SYNTHROID, LEVOTHROID) 75 MCG tablet Take 75 mcg by mouth daily before breakfast.  01/07/16  Yes [provider]  loratadine (CLARITIN) 10 MG tablet Take 10 mg by mouth daily as needed for allergies.   Yes [provider]  pantoprazole (PROTONIX) 40 MG tablet Take 40 mg by mouth 2 (two) times daily.   Yes [provider]    spironolactone (ALDACTONE) 100 MG tablet Take 1 tablet (100 mg total) by mouth daily. 01/03/18  Yes Mody, Ulice Bold, MD  albumin human 25 % bottle Give 25g of albumin per 4 liters of fluid removed max 50g Patient not taking: Reported on  03/18/2018 01/15/18   Lin Landsman, MD   Allergies  Allergen Reactions  . Metformin Diarrhea  . Amoxicillin-Pot Clavulanate Other (See Comments)    Other reaction(s): Unknown  . Codeine   . Erythromycin   . Erythromycin Ethylsuccinate     Other reaction(s): Unknown  . Levofloxacin Other (See Comments)    Jittery, nausea  . Metformin And Related     GI bleed  . Morphine Other (See Comments) and Nausea And Vomiting    caused severe cramping   . Morphine And Related   . Morphine Sulfate Er Beads Other (See Comments)    caused severe cramping   . Omeprazole Other (See Comments)    GI side effects GI side effects  . Spiriva Handihaler [Tiotropium Bromide Monohydrate] Swelling    Tongue  . Statins Other (See Comments)    Other reaction(s): Muscle Pain  . Tetracyclines & Related Other (See Comments)  . Ace Inhibitors Rash   Review of Systems  Constitutional: Positive for activity change, appetite change and fatigue.  Respiratory: Positive for shortness of breath.   Gastrointestinal: Positive for abdominal distention.  Neurological: Positive for weakness.   Physical Exam  Constitutional: She is oriented to person, place, and time. She is cooperative. She appears ill.  HENT:  Head: Normocephalic and atraumatic.  Cardiovascular: Regular rhythm.  Pulmonary/Chest: Breath sounds normal. No accessory muscle usage. No tachypnea. No respiratory distress.  2L [place]  Abdominal: She exhibits ascites. There is no tenderness.  Neurological: She is alert and oriented to person, place, and time.  Skin: Skin is warm and dry.  Psychiatric: Her speech is normal and behavior is normal. Cognition and memory are normal. She exhibits a depressed mood.  Nursing note  and vitals reviewed.  Vital Signs: BP 137/62 (BP Location: Left Arm)   Pulse 88   Temp 98.3 F (36.8 C) (Oral)   Resp 18   Ht 5' (1.524 m)   Wt 40 kg   SpO2 100%   BMI 17.21 kg/m  Pain Scale: 0-10   Pain Score: 0-No pain   SpO2: SpO2: 100 % O2 Device:SpO2: 100 % O2 Flow Rate: .O2 Flow Rate (L/min): 2 L/min  IO: Intake/output summary:   Intake/Output Summary (Last 24 hours) at 03/21/2018 0931 Last data filed at 03/20/2018 1033 Gross per 24 hour  Intake 240 ml  Output -  Net 240 ml    LBM: Last BM Date: 03/20/18 Baseline Weight: Weight: 42.5 kg Most recent weight: Weight: 40 kg     Palliative Assessment/Data: PPS 40%   Flowsheet Rows     Most Recent Value  Intake Tab  Referral Department  Hospitalist  Unit at Time of Referral  Med/Surg Unit  Palliative Care Primary Diagnosis  -- [decompensated liver cirrhosis]  Palliative Care Type  New Palliative care  Reason for referral  Clarify Goals of Care  Date first seen by Palliative Care  03/20/18  Clinical Assessment  Palliative Performance Scale Score  40%  Psychosocial & Spiritual Assessment  Palliative Care Outcomes  Patient/Family meeting held?  Yes  Who was at the meeting?  patient, daughter, son, stepfather  Palliative Care Outcomes  Clarified goals of care, Improved pain interventions, Improved non-pain symptom therapy, Provided end of life care assistance, Counseled regarding hospice, Provided psychosocial or spiritual support, ACP counseling assistance      Time In/Out: 0950-1110, 1700-1710 Time Total: 30mn Greater than 50%  of this time was spent counseling and coordinating care related to the above assessment  and plan.  Signed by:  Ihor Dow, FNP-C Palliative Medicine Team  Phone: [phone] Fax: [phone]   Please contact Palliative Medicine Team phone at [phone] for questions and concerns.  For individual provider: See Shea Evans

## 2018-03-20 NOTE — Progress Notes (Addendum)
[facility] - [place] at [facility]   PATIENT NAME: Angela Austin    MR#:  [id number]  DATE OF BIRTH:  [birth year]  SUBJECTIVE:  CHIEF COMPLAINT:   Chief Complaint  Patient presents with  . Weakness  Lethargic, confused - Ammonia still going up REVIEW OF SYSTEMS:  Review of Systems  Unable to perform ROS: Acuity of condition   DRUG ALLERGIES:   Allergies  Allergen Reactions  . Metformin Diarrhea  . Amoxicillin-Pot Clavulanate Other (See Comments)    Other reaction(s): Unknown  . Codeine   . Erythromycin   . Erythromycin Ethylsuccinate     Other reaction(s): Unknown  . Levofloxacin Other (See Comments)    Jittery, nausea  . Metformin And Related     GI bleed  . Morphine Other (See Comments) and Nausea And Vomiting    caused severe cramping   . Morphine And Related   . Morphine Sulfate Er Beads Other (See Comments)    caused severe cramping   . Omeprazole Other (See Comments)    GI side effects GI side effects  . Spiriva Handihaler [Tiotropium Bromide Monohydrate] Swelling    Tongue  . Statins Other (See Comments)    Other reaction(s): Muscle Pain  . Tetracyclines & Related Other (See Comments)  . Ace Inhibitors Rash   VITALS:  Blood pressure (!) 140/45, pulse 95, temperature 97.8 F (36.6 C), temperature source Oral, resp. rate 18, height 5' (1.524 m), weight 40 kg, SpO2 100 %. PHYSICAL EXAMINATION:  Physical Exam  Constitutional: She is oriented to person, place, and time. She appears lethargic. She appears cachectic.  HENT:  Head: Normocephalic and atraumatic.  Eyes: Pupils are equal, round, and reactive to light. Conjunctivae and EOM are normal.  Neck: Normal range of motion. Neck supple. No tracheal deviation present. No thyromegaly present.  Cardiovascular: Normal rate, regular rhythm and normal heart sounds.  Pulmonary/Chest: Effort normal and breath sounds normal. No respiratory distress. She has no wheezes. She exhibits no  tenderness.  Abdominal: Soft. She exhibits ascites. She exhibits no distension. Bowel sounds are decreased. There is tenderness.  Musculoskeletal: Normal range of motion.  Neurological: She is oriented to person, place, and time. She appears lethargic. No cranial nerve deficit.  Skin: Skin is warm and dry. No rash noted.   LABORATORY PANEL:  Female CBC Recent Labs  Lab 03/20/18 0434  WBC 4.2  HGB 9.1*  HCT 27.6*  PLT 64*   ------------------------------------------------------------------------------------------------------------------ Chemistries  Recent Labs  Lab 03/20/18 0434  NA 138  K 4.4  CL 98  CO2 32  GLUCOSE 139*  BUN 64*  CREATININE 2.19*  CALCIUM 8.9  AST 31  ALT 24  ALKPHOS 92  BILITOT 1.3*   RADIOLOGY:  No results found. ASSESSMENT AND PLAN:  Angela Austin  is a 72 y.o. female with a known history of liver cirrhosis secondary to [place], diabetes metas, GERD, hypothyroidism, diabetes thrombus cytopenic purpura sees Dr. Orlie DakinFinnegan as an outpatient and following Dr. Allegra LaiVanga offers liver cirrhosis is brought in to the ED as she is altered.  Ammonia level is at 73.  Patient was given 1 dose of lactulose and following that she had a bowel movement and during my examination she started answering few questions.  # Acute on chronic hepatic encephalopathy - increase Lactulose to tid, will titrate the dose until patient has 2 soft bowel movements - Ammonia 74->101, monitor  - Hepatic encephalopathy from liver cirrhosis likely secondary to [employer] Get neurochecks and monitor  patient clinically Continue lactulose, stop LasiX and Aldactone for now as kidney function worsened  * Acute on CKD 4 - creat worsened some, will stop lasix and aldactoine - c/s nephro   # hypothyroidism: Increase dose of Synthroid as TSH > 35  #Diabetes mellitus non-insulin-dependent Insulin sliding scale with Accu-Cheks - DM nurse following  #History of idiopathic thrombocytopenic  purpura Some bruises are noticed on the skin but no active bleeding.  Continue to monitor platelet count, platelets 76 -> 64. Stop lovenox   # failure to thrive Patient is unable to get out of the bed  - await palliative care input  physical therapy recommends HHPT     All the records are reviewed and case discussed with Care Management/Social Worker. Management plans discussed with the patient, family (daughter at bedside) and they are in agreement.  CODE STATUS: DNR  TOTAL TIME TAKING CARE OF THIS PATIENT: 20 minutes.   More than 50% of the time was spent in counseling/coordination of care: YES  POSSIBLE D/C IN 2-3 DAYS, DEPENDING ON CLINICAL CONDITION.   Delfino LovettVipul Kiarra Kidd M.D on 03/20/2018 at 5:15 PM  Between 7am to 6pm - Pager - [phone]  After 6pm go to [email or web address] - Social research officer, governmentpassword [email or web address]  [facility] [place] [facility]  Office  [phone]  CC: Primary care physician; Kandyce RudBabaoff, Marcus, MD  Note: This dictation was prepared with Dragon dictation along with smaller phrase technology. Any transcriptional errors that result from this process are unintentional.

## 2018-03-20 NOTE — Progress Notes (Signed)
Inpatient Diabetes Program Recommendations  AACE/ADA: New Consensus Statement on Inpatient Glycemic Control (2019)  Target Ranges:  Prepandial:   less than 140 mg/dL      Peak postprandial:   less than 180 mg/dL (1-2 hours)      Critically ill patients:  140 - 180 mg/dL   Results for Angela Austin, Angela Austin (MRN [id number]) as of 03/20/2018 07:38  Ref. Range 03/20/2018 04:34  Glucose Latest Ref Range: 70 - 99 mg/dL 409139 (H)   Results for Angela Austin, Angela Austin (MRN [id number]) as of 03/20/2018 07:38  Ref. Range 03/19/2018 07:38 03/19/2018 11:50 03/19/2018 16:57 03/19/2018 21:04  Glucose-Capillary Latest Ref Range: 70 - 99 mg/dL 956159 (H) 213155 (H) 086138 (H) 119 (H)   Results for Angela Austin, Angela Austin (MRN [id number]) as of 03/20/2018 07:38  Ref. Range 12/31/2017 07:23  Hemoglobin A1C Latest Ref Range: 4.8 - 5.6 % 7.8 (H)   Review of Glycemic Control  Diabetes history: DM2 Outpatient Diabetes medications: Glyburide 2.5 mg QAM Current orders for Inpatient glycemic control: Glyburide 2.5 mg QAM, Novolog 0-9 units TID with meals, Novolog 0-5 units QHS  Inpatient Diabetes Program Recommendations: HgbA1C: A1C 7.8% on 12/31/17 indicating an average glucose of 177 mg/dl.  NOTE: Noted consult for Diabetes Coordinator. Chart reviewed. Agree with current orders for inpatient glycemic control. Will continue to follow.  Thanks, Orlando PennerMarie Rhondalyn Clingan, RN, MSN, CDE Diabetes Coordinator Inpatient Diabetes Program [phone] (Team Pager from 8am to 5pm)

## 2018-03-20 NOTE — Progress Notes (Signed)
Initial Nutrition Assessment  DOCUMENTATION CODES:   Severe malnutrition in context of chronic illness  INTERVENTION:   Nepro Shake po BID, each supplement provides 425 kcal and 19 grams protein  MVI daily  Vitamin C 767HA po BID  Folic acid   Magic cup TID with meals, each supplement provides 290 kcal and 9 grams of protein  Liberalize diet   Chopped meats   NUTRITION DIAGNOSIS:   Severe Malnutrition related to chronic illness(NASH, COPD, FTT) as evidenced by 17 percent weight loss in 2 months, severe fat depletion, severe muscle depletion.  GOAL:   Patient will meet greater than or equal to 90% of their needs  MONITOR:   PO intake, Supplement acceptance, Labs, Weight trends, Skin, I & O's  REASON FOR ASSESSMENT:   Malnutrition Screening Tool    ASSESSMENT:   72 y.o. female with a known history of liver cirrhosis secondary to Angela Austin, diabetes metas, GERD, hypothyroidism, diabetes thrombus cytopenic purpura sees Dr. Grayland Austin as an outpatient and following Dr. Marius Austin offers liver cirrhosis admitted for AMS  Met with pt in room today. Pt reports poor appetite and oral intake at baseline; pt reports "I eat what I can". Pt currently eating 75-100% of meals in hospital. Pt ate 90% of her lunch today but is unhappy about her diet restrictions. Pt does report drinking 2 Premier Protein per day at home. Pt does not take any vitamins at home but reports that she used to take B12 daily. Per chart, pt has lost 19lbs(17%) in 2 months; this is severe weight loss. Pt with partial plates and dentures; denies any trouble chewing or swallowing but reports she shakes and can not cut up her foods. Pt requests chopped meats with meal trays. Of note, pt does not like cheese. Pt would like to try butter pecan Nepro while in hospital. RD will liberalize diet and add MVI. Pt noted to have petechiae secondary to ITP; will also order vitamin C supplementation. RD will liberalize diet and restrict salt  as discussed with patient. Pt also noted to have borderline low folate on last admit; recommend folic acid supplementation.   Medications reviewed and include: dulcolax, insulin, lactulose, synthroid, mirtazapine, protonix, aldactone  Labs reviewed: BUN 64(H), creat 2.19(H) Ammonia 101(H) Hgb 9.1(L), Hct 27.6(L) Folate- 6.4- 6/2 cbgs- 123, 109 x 24 hrs AIC 7.8(H)- 6/2  NUTRITION - FOCUSED PHYSICAL EXAM:    Most Recent Value  Orbital Region  No depletion  Upper Arm Region  Severe depletion  Thoracic and Lumbar Region  Severe depletion  Buccal Region  Mild depletion  Temple Region  Mild depletion  Clavicle Bone Region  Severe depletion  Clavicle and Acromion Bone Region  Severe depletion  Scapular Bone Region  Severe depletion  Dorsal Hand  Severe depletion  Patellar Region  Severe depletion  Anterior Thigh Region  Severe depletion  Posterior Calf Region  Severe depletion  Edema (RD Assessment)  None  Hair  Reviewed  Eyes  Reviewed  Mouth  Reviewed  Skin  Reviewed [petechiae and ecchymosis ]  Nails  Reviewed     Diet Order:   Diet Order            Diet heart healthy/carb modified Room service appropriate? Yes; Fluid consistency: Thin  Diet effective now             EDUCATION NEEDS:   Education needs have been addressed  Skin:  Skin Assessment: Reviewed RN Assessment(petechiae and ecchymosis )  Last BM:  8/19- type  5  Height:   Ht Readings from Last 1 Encounters:  03/18/18 5' (1.524 m)    Weight:   Wt Readings from Last 1 Encounters:  03/20/18 40 kg    Ideal Body Weight:  45.4 kg  BMI:  Body mass index is 17.21 kg/m.  Estimated Nutritional Needs:   Kcal:  1200-1400kcal/day   Protein:  60-68g/day   Fluid:  >1L/day   Angela Distance MS, RD, LDN Pager #- [phone] Office#- [phone] After Hours Pager: [phone]

## 2018-03-21 ENCOUNTER — Inpatient Hospital Stay: Payer: Medicare Other

## 2018-03-21 ENCOUNTER — Ambulatory Visit: Payer: Medicare Other | Admitting: Internal Medicine

## 2018-03-21 DIAGNOSIS — Z515 Encounter for palliative care: Secondary | ICD-10-CM

## 2018-03-21 DIAGNOSIS — Z7189 Other specified counseling: Secondary | ICD-10-CM

## 2018-03-21 DIAGNOSIS — R531 Weakness: Secondary | ICD-10-CM

## 2018-03-21 DIAGNOSIS — N179 Acute kidney failure, unspecified: Secondary | ICD-10-CM

## 2018-03-21 LAB — CBC
HCT: 28.1 % — ABNORMAL LOW (ref 35.0–47.0)
Hemoglobin: 9.3 g/dL — ABNORMAL LOW (ref 12.0–16.0)
MCH: 27.4 pg (ref 26.0–34.0)
MCHC: 33.3 g/dL (ref 32.0–36.0)
MCV: 82.2 fL (ref 80.0–100.0)
PLATELETS: 64 10*3/uL — AB (ref 150–440)
RBC: 3.41 MIL/uL — ABNORMAL LOW (ref 3.80–5.20)
RDW: 16.9 % — AB (ref 11.5–14.5)
WBC: 4.7 10*3/uL (ref 3.6–11.0)

## 2018-03-21 LAB — BASIC METABOLIC PANEL
ANION GAP: 10 (ref 5–15)
BUN: 62 mg/dL — ABNORMAL HIGH (ref 8–23)
CALCIUM: 9 mg/dL (ref 8.9–10.3)
CHLORIDE: 99 mmol/L (ref 98–111)
CO2: 31 mmol/L (ref 22–32)
Creatinine, Ser: 2.19 mg/dL — ABNORMAL HIGH (ref 0.44–1.00)
GFR calc non Af Amer: 21 mL/min — ABNORMAL LOW (ref >60)
GFR, EST AFRICAN AMERICAN: 25 mL/min — AB (ref >60)
GLUCOSE: 175 mg/dL — AB (ref 70–99)
POTASSIUM: 4.5 mmol/L (ref 3.5–5.1)
Sodium: 140 mmol/L (ref 135–145)

## 2018-03-21 LAB — GLUCOSE, CAPILLARY
GLUCOSE-CAPILLARY: 133 mg/dL — AB (ref 70–99)
GLUCOSE-CAPILLARY: 134 mg/dL — AB (ref 70–99)
Glucose-Capillary: 151 mg/dL — ABNORMAL HIGH (ref 70–99)

## 2018-03-21 LAB — AMMONIA: Ammonia: 82 umol/L — ABNORMAL HIGH (ref 9–35)

## 2018-03-21 LAB — T4, FREE: FREE T4: 1.01 ng/dL (ref 0.82–1.77)

## 2018-03-21 MED ORDER — MORPHINE SULFATE (PF) 2 MG/ML IV SOLN: 1.0000 mg | INTRAVENOUS | Status: DC | PRN

## 2018-03-21 MED ORDER — ALBUMIN HUMAN 25 % IV SOLN
25.0000 g | Freq: Three times a day (TID) | INTRAVENOUS | Status: DC
  Administered 2018-03-21: 25 g via INTRAVENOUS
  Filled 2018-03-21 (×2): qty 100

## 2018-03-21 MED ORDER — LORAZEPAM 2 MG/ML IJ SOLN
1.0000 mg | INTRAMUSCULAR | Status: DC | PRN
  Administered 2018-03-21 – 2018-03-22 (×2): 1 mg via INTRAVENOUS
  Filled 2018-03-21 (×2): qty 1

## 2018-03-21 MED ORDER — SODIUM CHLORIDE 0.9 % IV SOLN
INTRAVENOUS | Status: DC
  Administered 2018-03-21: 11:00:00 via INTRAVENOUS

## 2018-03-21 MED ORDER — MIRTAZAPINE 15 MG PO TBDP
7.5000 mg | ORAL_TABLET | Freq: Every day | ORAL | Status: DC
  Filled 2018-03-21 (×2): qty 0.5

## 2018-03-21 NOTE — Consult Note (Signed)
[facility] CONSULT NOTE    Date: 03/21/2018                  Patient Name:  Angela Austin  MRN: [id number]  DOB: 04/01/1946  Age / Sex: 72 y.o., female         PCP: Derinda Late, MD                 Service Requesting Consult: Hospitalist                 Reason for Consult: Acute renal failure            History of Present Illness: Patient is a 72 y.o. 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  It is felt that she has hepatic encephalopathy.  Patient is followed closely by gastroenterology.  We are asked to see her for evaluation management of acute renal failure with a pre-existing chronic kidney disease stage IV.  Her baseline EGFR ranges between 21-27.  Creatinine at the moment is 2.19 with an EGFR of 21.  She was on spironolactone as well as Lasix and these have now been held.  Her p.o. intake has been rather poor over the past several days as per her son.   Medications: Outpatient medications: Facility-Administered Medications Prior to Admission  Medication Dose Route Frequency Provider Last Rate Last Dose  . albumin human 25 % solution 25 g  25 g Intravenous Once Vanga, Tally Due, MD      . albumin human 25 % solution 25 g  25 g Intravenous Once Lin Landsman, MD       Medications Prior to Admission  Medication Sig Dispense Refill Last Dose  . albuterol (ACCUNEB) 1.25 MG/3ML nebulizer solution Take 1 ampule by nebulization every 6 (six) hours as needed for wheezing.   PRN at PRN  . albuterol (PROVENTIL HFA) 108 (90 Base) MCG/ACT inhaler Inhale 1 puff into the lungs every 4 (four) hours as needed.    PRN at PRN  . FLUoxetine (PROZAC) 20 MG tablet Take 20 mg by mouth daily.   Taking  . fluticasone (FLONASE) 50 MCG/ACT nasal spray Place 1-2 sprays into  both nostrils daily.    Taking  . furosemide (LASIX) 40 MG tablet Take 1 tablet (40 mg total) by mouth daily. 30 tablet 0 Taking  . glyBURIDE (DIABETA) 2.5 MG tablet Take 1 tablet (2.5 mg total) by mouth daily with breakfast. 30 tablet 11 Taking  . levothyroxine (SYNTHROID, LEVOTHROID) 75 MCG tablet Take 75 mcg by mouth daily before breakfast.    Taking  . loratadine (CLARITIN) 10 MG tablet Take 10 mg by mouth daily as needed for allergies.   PRN at PRN  . pantoprazole (PROTONIX) 40 MG tablet Take 40 mg by mouth 2 (two) times daily.   Taking  . spironolactone (ALDACTONE) 100 MG tablet Take 1 tablet (100 mg total) by mouth daily. 30 tablet 0 Taking  . albumin human 25 % bottle Give 25g of albumin per 4 liters of fluid removed max 50g (Patient not taking: Reported on 03/18/2018) 50 mL 0 Not Taking at Unknown time    Current medications: Current Facility-Administered Medications  Medication Dose Route Frequency Provider Last Rate Last Dose  . 0.9 %  sodium chloride infusion   Intravenous Continuous Dynasia Kercheval,  MD 50 mL/hr at 03/21/18 1049    . albumin human 25 % solution 25 g  25 g Intravenous Q8H Ociel Retherford, MD 60 mL/hr at 03/21/18 1115 25 g at 03/21/18 1115  . albuterol (PROVENTIL) (2.5 MG/3ML) 0.083% nebulizer solution 1.25 mg  1.25 mg Nebulization Q6H PRN Gouru, Aruna, MD      . bisacodyl (DULCOLAX) suppository 10 mg  10 mg Rectal Daily Demetrios Loll, MD      . docusate sodium (COLACE) capsule 100 mg  100 mg Oral Daily PRN Gouru, Aruna, MD      . feeding supplement (NEPRO CARB STEADY) liquid 237 mL  237 mL Oral BID BM Manuella Ghazi, Vipul, MD      . FLUoxetine (PROZAC) capsule 20 mg  20 mg Oral Daily Gouru, Aruna, MD   20 mg at 03/21/18 1033  . fluticasone (FLONASE) 50 MCG/ACT nasal spray 1 spray  1 spray Each Nare Daily Gouru, Aruna, MD   1 spray at 03/21/18 1032  . folic acid (FOLVITE) tablet 1 mg  1 mg Oral Daily Max Sane, MD   1 mg at 03/21/18 1032  . glyBURIDE (DIABETA) tablet 2.5 mg  2.5  mg Oral Q breakfast Gouru, Aruna, MD   2.5 mg at 03/21/18 0805  . insulin aspart (novoLOG) injection 0-5 Units  0-5 Units Subcutaneous QHS Gouru, Aruna, MD      . insulin aspart (novoLOG) injection 0-9 Units  0-9 Units Subcutaneous TID WC Nicholes Mango, MD   2 Units at 03/21/18 0805  . lactulose (CHRONULAC) 10 GM/15ML solution 30 g  30 g Oral TID Max Sane, MD   30 g at 03/21/18 1032  . levothyroxine (SYNTHROID, LEVOTHROID) tablet 100 mcg  100 mcg Oral QAC breakfast Max Sane, MD   100 mcg at 03/21/18 0805  . loratadine (CLARITIN) tablet 10 mg  10 mg Oral Daily PRN Gouru, Aruna, MD      . mirtazapine (REMERON SOL-TAB) disintegrating tablet 15 mg  15 mg Oral QHS Basilio Cairo, NP   15 mg at 03/20/18 2219  . multivitamin with minerals tablet 1 tablet  1 tablet Oral Daily Max Sane, MD   1 tablet at 03/20/18 1934  . ondansetron (ZOFRAN) tablet 4 mg  4 mg Oral Q6H PRN Gouru, Aruna, MD       Or  . ondansetron (ZOFRAN) injection 4 mg  4 mg Intravenous Q6H PRN Gouru, Aruna, MD      . pantoprazole (PROTONIX) EC tablet 40 mg  40 mg Oral BID Gouru, Aruna, MD   40 mg at 03/21/18 1032  . triamcinolone ointment (KENALOG) 0.5 %   Topical BID Gouru, Aruna, MD      . vitamin C (ASCORBIC ACID) tablet 250 mg  250 mg Oral BID Max Sane, MD   250 mg at 03/21/18 1032      Allergies: Allergies  Allergen Reactions  . Metformin Diarrhea  . Amoxicillin-Pot Clavulanate Other (See Comments)    Other reaction(s): Unknown  . Codeine   . Erythromycin   . Erythromycin Ethylsuccinate     Other reaction(s): Unknown  . Levofloxacin Other (See Comments)    Jittery, nausea  . Metformin And Related     GI bleed  . Morphine Other (See Comments) and Nausea And Vomiting    caused severe cramping   . Morphine And Related   . Morphine Sulfate Er Beads Other (See Comments)    caused severe cramping   . Spiriva Handihaler [Tiotropium Bromide Monohydrate] Swelling  Tongue  . Statins Other (See Comments)    Other  reaction(s): Muscle Pain  . Tetracyclines & Related Other (See Comments)  . Ace Inhibitors Rash      Past Medical History: Past Medical History:  Diagnosis Date  . Cirrhosis of liver not due to alcohol ([place])   . COPD (chronic obstructive pulmonary disease) ([place])   . Depression   . Diabetes ([place])   . GERD (gastroesophageal reflux disease)   . Hypothyroidism   . ITP (idiopathic thrombocytopenic purpura)   . Nephrolithiasis      Past Surgical History: Past Surgical History:  Procedure Laterality Date  . CATARACT EXTRACTION    . ESOPHAGOGASTRODUODENOSCOPY N/A 12/31/2017   Procedure: ESOPHAGOGASTRODUODENOSCOPY (EGD);  Surgeon: Lin Landsman, MD;  [facility] END[facility]COPY;  Service: Gastroenterology;  Laterality: N/A;  . ESOPHAGOGASTRODUODENOSCOPY (EGD) WITH PROPOFOL N/A 01/25/2018   Procedure: ESOPHAGOGASTRODUODENOSCOPY (EGD) WITH PROPOFOL;  Surgeon: Lin Landsman, MD;  Location: [facility] ENDOSCOPY;  Service: Gastroenterology;  Laterality: N/A;  . IR RADIOLOGIST EVAL & MGMT  02/15/2018  . LUMBAR DISC SURGERY    . TONSILLECTOMY AND ADENOIDECTOMY       Family History: Family History  Problem Relation Age of Onset  . Alzheimer's disease Mother   . COPD Mother   . Cancer Mother   . COPD Father   . Diabetes Mellitus II Father   . Heart attack Father      Social History: Social History   Socioeconomic History  . Marital status: Widowed    Spouse name: Not on file  . Number of children: Not on file  . Years of education: Not on file  . Highest education level: Not on file  Occupational History  . Not on file  Social Needs  . Financial resource strain: Not on file  . Food insecurity:    Worry: Not on file    Inability: Not on file  . Transportation needs:    Medical: Not on file    Non-medical: Not on file  Tobacco Use  . Smoking status: Former Smoker    Years: 25.00    Types: Cigarettes  . Smokeless tobacco: Never Used  Substance and Sexual Activity  .  Alcohol use: No    Alcohol/week: 0.0 standard drinks  . Drug use: No  . Sexual activity: Not Currently  Lifestyle  . Physical activity:    Days per week: Not on file    Minutes per session: Not on file  . Stress: Not on file  Relationships  . Social connections:    Talks on phone: Not on file    Gets together: Not on file    Attends religious service: Not on file    Active member of club or organization: Not on file    Attends meetings of clubs or organizations: Not on file    Relationship status: Not on file  . Intimate partner violence:    Fear of current or ex partner: Not on file    Emotionally abused: Not on file    Physically abused: Not on file    Forced sexual activity: Not on file  Other Topics Concern  . Not on file  Social History Narrative  . Not on file     Review of Systems: Patient cannot offer secondary to confusion.  Vital Signs: Blood pressure 137/62, pulse 88, temperature 98.3 F (36.8 C), temperature source Oral, resp. rate 18, height 5' (1.524 m), weight 40 kg, SpO2 100 %.  Weight trends: [employer]  03/18/18 1049 03/18/18 1456 03/20/18 1439  Weight: 42.5 kg 40.1 kg 40 kg    Physical Exam: General: Frail appearing  Head: Normocephalic, atraumatic.  Eyes: Anicteric, EOMI  Nose: Mucous membranes moist, not inflammed, nonerythematous.  Throat: Oropharynx nonerythematous, no exudate appreciated.   Neck: Supple, trachea midline.  Lungs:  Normal respiratory effort. Clear to auscultation BL without crackles or wheezes.  Heart: RRR. S1 and S2 normal without gallop, murmur, or rubs.  Abdomen:  BS normoactive. Soft, Nondistended, non-tender.  No masses or organomegaly.  Extremities: No pretibial edema.  Neurologic: Lethargic but arousable, confused  Skin: No visible rashes, scars.    Lab results: Basic Metabolic Panel: Recent Labs  Lab 03/19/18 0446 03/20/18 0434 03/21/18 0409  NA 139 138 140  K 4.5 4.4 4.5  CL 98 98 99  CO2 32 32 31   GLUCOSE 178* 139* 175*  BUN 63* 64* 62*  CREATININE 1.91* 2.19* 2.19*  CALCIUM 9.0 8.9 9.0    Liver Function Tests: Recent Labs  Lab 03/18/18 1104 03/19/18 0446 03/20/18 0434  AST 38 34 31  ALT _0 ALKPHOS 89 92 92  BILITOT 1.2 1.2 1.3*  PROT 6.3* 5.4* 5.3*  ALBUMIN 3.5 2.9* 3.0*   No results for input(s): LIPASE, AMYLASE in the last 168 hours. Recent Labs  Lab 03/19/18 0446 03/20/18 0434 03/21/18 0409  AMMONIA 77* 101* 82*    CBC: Recent Labs  Lab 03/18/18 1104 03/20/18 0434 03/21/18 0409  WBC 5.1 4.2 4.7  NEUTROABS 4.2  --   --   HGB 10.7* 9.1* 9.3*  HCT 32.1* 27.6* 28.1*  MCV 82.0 82.2 82.2  PLT 76* 64* 64*    Cardiac Enzymes: Recent Labs  Lab 03/18/18 1104  TROPONINI <0.03    BNP: Invalid input(s): POCBNP  CBG: Recent Labs  Lab 03/20/18 0801 03/20/18 1137 03/20/18 1620 03/20/18 2206 03/21/18 0733  GLUCAP 123* 109* 231* 155* 151*    Microbiology: Results for orders placed or performed during the hospital encounter of 12/30/17  Body fluid culture     Status: None   Collection Time: 12/30/17  2:41 PM  Result Value Ref Range Status   Specimen Description   Final    PERITONEAL Performed at [facility], [address] [place], [place] [address]    Special Requests   Final    NONE Performed at [facility], [address] [place], [place] [address]    Gram Stain   Final    WBC PRESENT,BOTH PMN AND MONONUCLEAR NO ORGANISMS SEEN CYTOSPIN SMEAR    Culture   Final    NO GROWTH 3 DAYS Performed at [facility], [place] [address] [place],  [address]    Report Status 01/03/2018 FINAL  Final    Coagulation Studies: No results for input(s): LABPROT, INR in the last 72 hours.  Urinalysis: No results for input(s): COLORURINE, LABSPEC, PHURINE, GLUCOSEU, HGBUR, BILIRUBINUR, KETONESUR, PROTEINUR, UROBILINOGEN, NITRITE, LEUKOCYTESUR in the last 72 hours.  Invalid input(s): APPERANCEUR     Imaging:  No results found.   Assessment & Plan: Pt is a 72 y.o. 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   1.  Acute renal failure.  2.  Chronic kidney disease stage IV. 3.  Anemia of Chronic Kidney disease. 4.  Cirrhosis of the liver.  5.  Hepatic encephalopathy.  Plan: We are asked to see the patient for evaluation management of acute renal failure with a creatinine of 2.19.  She also has underlying chronic kidney disease stage IV.  Agree with holding diuretics.  Her p.o. intake has been rather poor over the past several days.  Start the patient on 0.9 normal saline at 50 cc/h.  Also add albumin 25 g IV every 8 hours for the next 24 to 48 hours.  Follow creatinine trend.  No urgent indication for dialysis at the moment.  Check a renal ultrasound to make sure there is no underlying obstruction.  Further plan as patient progresses.

## 2018-03-21 NOTE — Clinical Social Work Note (Signed)
Clinical Social Work Assessment  Patient Details  Name: Angela Austin MRN: [id number] Date of Birth: [birth year]  Date of referral:  03/21/18               Reason for consult:  Facility Placement                Permission sought to share information with:  Case Manager, Customer service manager, Family Supports Permission granted to share information::  Yes, Verbal Permission Granted  Name::      SNF  Agency::   [facility]   Relationship::     Contact Information:     Housing/Transportation Living arrangements for the past 2 months:  Single Family Home Source of Information:  Adult Children Patient Interpreter Needed:  None Criminal Activity/Legal Involvement Pertinent to Current Situation/Hospitalization:  No - Comment as needed Significant Relationships:  Adult Children, Parents, Other Family Members Lives with:  Parents Do you feel safe going back to the place where you live?  No Need for family participation in patient care:  Yes (Comment)  Care giving concerns:  Patient lives with her 89 year old step father    Facilities manager / plan:  CSW consulted for SNF placement. CSW met with patient, step father Jarrett Ables and son Ellie Bryand at bedside. CSW introduced self and explained role. Son states that patient is living with her step father and also has support from children. Son states that they are interested in patient going to rehab at [facility]. CSW explained that patient may not be able to participate in therapy for very long and there is a possibility that patient would have to private pay at facility. Family states they understand and would like patient to go to rehab and try to get as strong as possible. CSW initiated bed search and will give bed offers once received. CSW will follow for discharge planning.   Employment status:  Retired Forensic scientist:  [employer] PT Recommendations:  [place] / Referral to  community resources:  [place]  Patient/Family's Response to care:  Patient and family thanked CSW for assistance   Patient/Family's Understanding of and Emotional Response to Diagnosis, Current Treatment, and Prognosis:  Family understands that patient would benefit from hospice services and that rehab is unlikely but they would like to pursue rehab placement.   Emotional Assessment Appearance:  Appears stated age Attitude/Demeanor/Rapport:    Affect (typically observed):  Accepting, Quiet Orientation:  Oriented to Self, Oriented to Place, Oriented to  Time Alcohol / Substance use:  Not Applicable Psych involvement (Current and /or in the community):  No (Comment)  Discharge Needs  Concerns to be addressed:  Discharge Planning Concerns Readmission within the last 30 days:  No Current discharge risk:  None Barriers to Discharge:  Continued Medical Work up   [employer], [place] 03/21/2018, 11:20 AM

## 2018-03-21 NOTE — [place] FL2 (Signed)
[place] MEDICAID FL2 LEVEL OF CARE SCREENING TOOL     IDENTIFICATION  Patient Name: Angela Austin Birthdate: 1946/03/27 Sex: female Admission Date (Current Location): 03/18/2018  [place]   Facility and Address:  [facility], [address], [place], [place] [address]      Provider Number: [id number]  Attending Physician Name and Address:  Altamese DillingVachhani, Vaibhavkumar, *  Relative Name and Phone Number:  Jeannetta EllisStacie Gregory- Daughter [phone]    Current Level of Care: Hospital Recommended Level of Care: Skilled Nursing Facility Prior Approval Number:    Date Approved/Denied:   PASRR Number: [id number] [id number]  Discharge Plan: SNF    Current Diagnoses: Patient Active Problem List   Diagnosis Date Noted  . Palliative care by specialist   . Goals of care, counseling/discussion   . Generalized weakness   . Protein-calorie malnutrition, severe 03/20/2018  . Hepatic encephalopathy (HCC) 03/18/2018  . Ascites 12/30/2017  . Melena 12/30/2017  . Thrombocytopenia (HCC) 02/10/2015  . Physical deconditioning 07/03/2014  . COPD exacerbation (HCC) 07/03/2014  . Cirrhosis, non-alcoholic (HCC) 07/03/2014  . Essential hypertension, benign 07/03/2014  . Anemia, iron deficiency 07/03/2014  . Major depression 07/03/2014  . Esophageal varices in cirrhosis (HCC) 07/03/2014  . Secondary esophageal varices with bleeding (HCC) 06/06/2014  . Centrilobular emphysema (HCC) 12/24/2013  . Other and unspecified hyperlipidemia 12/24/2013  . Type II or unspecified type diabetes mellitus without mention of complication, uncontrolled 12/24/2013  . Hypothyroidism 12/24/2013    Orientation RESPIRATION BLADDER Height & Weight     Self, Time, Place  Normal Incontinent Weight: 88 lb 1.6 oz (40 kg) Height:  5' (152.4 cm)  BEHAVIORAL SYMPTOMS/MOOD NEUROLOGICAL BOWEL NUTRITION STATUS  (none) (None) Incontinent Diet(Regular with Thin Liquids )   AMBULATORY STATUS COMMUNICATION OF NEEDS Skin   Extensive Assist Verbally Normal                       Personal Care Assistance Level of Assistance  Bathing, Feeding, Dressing Bathing Assistance: Limited assistance Feeding assistance: Independent Dressing Assistance: Limited assistance     Functional Limitations Info  Sight, Hearing, Speech Sight Info: Adequate Hearing Info: Adequate Speech Info: Adequate    SPECIAL CARE FACTORS FREQUENCY  PT (By licensed PT), OT (By licensed OT)                    Contractures Contractures Info: Not present    Additional Factors Info  Code Status, Allergies Code Status Info: DNR Allergies Info:  Metformin, Amoxicillin-pot Clavulanate, Codeine, Erythromycin, Erythromycin Ethylsuccinate, Levofloxacin, Metformin And Related, Morphine, Morphine And Related, Morphine Sulfate Er Beads, Spiriva Handihaler Tiotropium Bromide Monohydrate, Statins, Tetracyclines & Related, Ace Inhibitors           Current Medications (03/21/2018):  This is the current hospital active medication list Current Facility-Administered Medications  Medication Dose Route Frequency Provider Last Rate Last Dose  . 0.9 %  sodium chloride infusion   Intravenous Continuous Lateef, [place], MD 50 mL/hr at 03/21/18 1049    . albumin human 25 % solution 25 g  25 g Intravenous Q8H Lateef, Munsoor, MD 60 mL/hr at 03/21/18 1115 25 g at 03/21/18 1115  . albuterol (PROVENTIL) (2.5 MG/3ML) 0.083% nebulizer solution 1.25 mg  1.25 mg Nebulization Q6H PRN Gouru, Aruna, MD      . bisacodyl (DULCOLAX) suppository 10 mg  10 mg Rectal Daily Shaune Pollackhen, Qing, MD      . docusate sodium (COLACE) capsule 100  mg  100 mg Oral Daily PRN Gouru, Aruna, MD      . feeding supplement (NEPRO CARB STEADY) liquid 237 mL  237 mL Oral BID BM Sherryll BurgerShah, Vipul, MD      . FLUoxetine (PROZAC) capsule 20 mg  20 mg Oral Daily Gouru, Aruna, MD   20 mg at 03/21/18 1033  . fluticasone (FLONASE) 50 MCG/ACT nasal spray 1  spray  1 spray Each Nare Daily Gouru, Aruna, MD   1 spray at 03/21/18 1032  . folic acid (FOLVITE) tablet 1 mg  1 mg Oral Daily Delfino LovettShah, Vipul, MD   1 mg at 03/21/18 1032  . glyBURIDE (DIABETA) tablet 2.5 mg  2.5 mg Oral Q breakfast Gouru, Aruna, MD   2.5 mg at 03/21/18 0805  . insulin aspart (novoLOG) injection 0-5 Units  0-5 Units Subcutaneous QHS Gouru, Aruna, MD      . insulin aspart (novoLOG) injection 0-9 Units  0-9 Units Subcutaneous TID WC Ramonita LabGouru, Aruna, MD   2 Units at 03/21/18 0805  . lactulose (CHRONULAC) 10 GM/15ML solution 30 g  30 g Oral TID Delfino LovettShah, Vipul, MD   30 g at 03/21/18 1032  . levothyroxine (SYNTHROID, LEVOTHROID) tablet 100 mcg  100 mcg Oral QAC breakfast Delfino LovettShah, Vipul, MD   100 mcg at 03/21/18 0805  . loratadine (CLARITIN) tablet 10 mg  10 mg Oral Daily PRN Gouru, Aruna, MD      . mirtazapine (REMERON SOL-TAB) disintegrating tablet 15 mg  15 mg Oral QHS Alita ChyleMason, Megan M, NP   15 mg at 03/20/18 2219  . multivitamin with minerals tablet 1 tablet  1 tablet Oral Daily Delfino LovettShah, Vipul, MD   1 tablet at 03/20/18 1934  . ondansetron (ZOFRAN) tablet 4 mg  4 mg Oral Q6H PRN Gouru, Aruna, MD       Or  . ondansetron (ZOFRAN) injection 4 mg  4 mg Intravenous Q6H PRN Gouru, Aruna, MD      . pantoprazole (PROTONIX) EC tablet 40 mg  40 mg Oral BID Gouru, Aruna, MD   40 mg at 03/21/18 1032  . triamcinolone ointment (KENALOG) 0.5 %   Topical BID Gouru, Aruna, MD      . vitamin C (ASCORBIC ACID) tablet 250 mg  250 mg Oral BID Delfino LovettShah, Vipul, MD   250 mg at 03/21/18 1032     Discharge Medications: Please see discharge summary for a list of discharge medications.  Relevant Imaging Results:  Relevant Lab Results:   Additional Information SSN [id number]  Ruthe Mannanandace  Katherene Dinino, [place]

## 2018-03-21 NOTE — Progress Notes (Signed)
[employer] - [place] at [facility]   PATIENT NAME: Angela Austin    MR#:  [id number]  DATE OF BIRTH:  1946-03-17  SUBJECTIVE:  CHIEF COMPLAINT:   Chief Complaint  Patient presents with  . Weakness   More alert, no confusion.  REVIEW OF SYSTEMS:  Review of Systems  Constitutional: Positive for malaise/fatigue. Negative for chills and fever.  HENT: Negative for ear discharge, hearing loss and sore throat.   Eyes: Negative for blurred vision and photophobia.  Respiratory: Negative for cough, hemoptysis and shortness of breath.   Cardiovascular: Negative for chest pain, palpitations and leg swelling.  Gastrointestinal: Negative for abdominal pain, nausea and vomiting.  Genitourinary: Negative for dysuria and frequency.  Musculoskeletal: Negative for back pain and myalgias.  Skin: Negative for rash.  Neurological: Positive for tremors. Negative for sensory change, focal weakness and headaches.   DRUG ALLERGIES:   Allergies  Allergen Reactions  . Metformin Diarrhea  . Amoxicillin-Pot Clavulanate Other (See Comments)    Other reaction(s): Unknown  . Codeine   . Erythromycin   . Erythromycin Ethylsuccinate     Other reaction(s): Unknown  . Levofloxacin Other (See Comments)    Jittery, nausea  . Metformin And Related     GI bleed  . Morphine Other (See Comments) and Nausea And Vomiting    caused severe cramping   . Morphine And Related   . Morphine Sulfate Er Beads Other (See Comments)    caused severe cramping   . Spiriva Handihaler [Tiotropium Bromide Monohydrate] Swelling    Tongue  . Statins Other (See Comments)    Other reaction(s): Muscle Pain  . Tetracyclines & Related Other (See Comments)  . Ace Inhibitors Rash   VITALS:  Blood pressure 137/62, pulse 88, temperature 98.3 F (36.8 C), temperature source Oral, resp. rate 18, height 5' (1.524 m), weight 40 kg, SpO2 100 %. PHYSICAL EXAMINATION:  Physical Exam  Constitutional: She is  oriented to person, place, and time. She appears cachectic.  HENT:  Head: Normocephalic and atraumatic.  Eyes: Pupils are equal, round, and reactive to light. Conjunctivae and EOM are normal.  Neck: Normal range of motion. Neck supple. No tracheal deviation present. No thyromegaly present.  Cardiovascular: Normal rate, regular rhythm and normal heart sounds.  Pulmonary/Chest: Effort normal and breath sounds normal. No respiratory distress. She has no wheezes. She exhibits no tenderness.  Abdominal: Soft. She exhibits ascites. She exhibits no distension. Bowel sounds are decreased. There is tenderness.  Musculoskeletal: Normal range of motion.  Neurological: She is oriented to person, place, and time. No cranial nerve deficit.  tremers present.  Skin: Skin is warm and dry. No rash noted.   LABORATORY PANEL:  Female CBC Recent Labs  Lab 03/21/18 0409  WBC 4.7  HGB 9.3*  HCT 28.1*  PLT 64*   ------------------------------------------------------------------------------------------------------------------ Chemistries  Recent Labs  Lab 03/20/18 0434 03/21/18 0409  NA 138 140  K 4.4 4.5  CL 98 99  CO2 32 31  GLUCOSE 139* 175*  BUN 64* 62*  CREATININE 2.19* 2.19*  CALCIUM 8.9 9.0  AST 31  --   ALT 24  --   ALKPHOS 92  --   BILITOT 1.3*  --    RADIOLOGY:  [place] Renal  Result Date: 03/21/2018 CLINICAL DATA:  Acute renal failure. EXAM: RENAL / URINARY TRACT ULTRASOUND COMPLETE COMPARISON:  CT abdomen and pelvis 06/26/2014. FINDINGS: Right Kidney: Length: 8.6 cm. Echogenicity within normal limits. No mass or hydronephrosis visualized. Left  Kidney: Length: 8.9 cm. Echogenicity within normal limits. No mass or hydronephrosis visualized. Bladder: Appears normal for degree of bladder distention. Other findings: Visualized liver demonstrates changes of cirrhosis. Moderate volume of ascites is present in the abdomen and pelvis. IMPRESSION: No acute abnormality.  Negative for hydronephrosis.  Cirrhotic liver and moderate abdominal ascites. Electronically Signed   By: Drusilla Kannerhomas  Dalessio M.D.   On: 03/21/2018 15:01   ASSESSMENT AND PLAN:  Angela SituJackie Austin  is a 72 y.o. female with a known history of liver cirrhosis secondary to [place], diabetes metas, GERD, hypothyroidism, diabetes thrombus cytopenic purpura sees Dr. Orlie DakinFinnegan as an outpatient and following Dr. Allegra LaiVanga offers liver cirrhosis is brought in to the ED as she is altered.  Ammonia level is at 73.  Patient was given 1 dose of lactulose and following that she had a bowel movement and during my examination she started answering few questions.  # Acute on chronic hepatic encephalopathy - increase Lactulose to tid, will titrate the dose until patient has 2 soft bowel movements - Ammonia 74->101, monitor - came down now. - Hepatic encephalopathy from liver cirrhosis likely secondary to [employer] Get neurochecks and monitor patient clinically Continue lactulose, stop LasiX and Aldactone for now as kidney function worsened  * Acute on CKD 4 - creat worsened some, will stop lasix and aldactoine - c/s nephro   # hypothyroidism: Increase dose of Synthroid as TSH > 35  #Diabetes mellitus non-insulin-dependent Insulin sliding scale with Accu-Cheks - DM nurse following  #History of idiopathic thrombocytopenic purpura Some bruises are noticed on the skin but no active bleeding.  Continue to monitor platelet count, platelets 76 -> 64. Stop lovenox   # failure to thrive Patient is unable to get out of the bed  - appreciated palliative care input  # Ac renal failure   Appreciated nephro help, IV albumin and IV fluids.  physical therapy recommends HHPT  Family had understood the poor prognosis, and they were ready to involve hospice in her care, but as per palliative care she does not look like having less than 2 weeks of prognosis so not a candidate for hospice home and family cannot take care of her at home 24 hours because of her  poor functional status.  They are currently interested on getting her to have rehab with palliative care with hospice involvement there and if she does not have much improvement then they might take her home with hospice.  I have strongly suggested them to start looking for 24-hour at home health helper to private agencies as they would need for that very soon.  Patient is not able to perform any kind of activities with rehab, so she may not be able to stay longer at the rehab unless family start paying for long-term care, which does not seems like their consideration.     All the records are reviewed and case discussed with Care Management/Social Worker. Management plans discussed with the patient, family (daughter at bedside) and they are in agreement.  CODE STATUS: DNR  TOTAL TIME TAKING CARE OF THIS PATIENT: 40 minutes.   More than 50% of the time was spent in counseling/coordination of care: YES  POSSIBLE D/C IN 2-3 DAYS, DEPENDING ON CLINICAL CONDITION.   Altamese DillingVaibhavkumar Harpreet Pompey M.D on 03/21/2018 at 4:06 PM  Between 7am to 6pm - Pager - [phone]  After 6pm go to [email or web address] - Scientist, research (life sciences)password [email or web address]  [facility] [place] [facility]  Office  [phone]  CC: Primary care physician; Kandyce RudBabaoff, Marcus,  MD  Note: This dictation was prepared with Dragon dictation along with smaller phrase technology. Any transcriptional errors that result from this process are unintentional.

## 2018-03-21 NOTE — Progress Notes (Signed)
Daily Progress Note   Patient Name: Angela Austin       Date: 03/21/2018 DOB: September 17, 1945  Age: 72 y.o. MRN#: [id number] Attending Physician: Altamese Dilling, * Primary Care Physician: Kandyce Rud, MD Admit Date: 03/18/2018  Reason for Consultation/Follow-up: Establishing goals of care  Subjective: Patient awake, alert, oriented. Denies pain or discomfort. Tells me she slept better last night. Visiting with family at bedside.   GOC:  F/u with daughter and son at bedside while patient was off unit for renal ultrasound.  Again discussed in detail diagnoses, interventions, and prognosis. Family struggles with plan after discharge. They initially were willing to consider home with hospice services but now further requesting physical therapy evaluation for possible SNF for rehab. They understand she may not be a candidate for rehab and may have to pay out of pocket expenses. Private duty caregiver list has been provided. Discussed palliative versus hospice options in detail.   Kennyth Arnold and Christen Bame again speak of her losing the will to live. They have been trying to encourage her to eat/drink.   Kennyth Arnold feels the remeron helped her sleep last night but that she was very groggy this morning. I explained that I would decrease the dose.   Therapeutic listening as children share stories of their mother. Emotional support provided.    Length of Stay: 3  Current Medications: Scheduled Meds:  . bisacodyl  10 mg Rectal Daily  . feeding supplement (NEPRO CARB STEADY)  237 mL Oral BID BM  . FLUoxetine  20 mg Oral Daily  . fluticasone  1 spray Each Nare Daily  . folic acid  1 mg Oral Daily  . glyBURIDE  2.5 mg Oral Q breakfast  . insulin aspart  0-5 Units Subcutaneous QHS  . insulin  aspart  0-9 Units Subcutaneous TID WC  . lactulose  30 g Oral TID  . levothyroxine  100 mcg Oral QAC breakfast  . mirtazapine  7.5 mg Oral QHS  . multivitamin with minerals  1 tablet Oral Daily  . pantoprazole  40 mg Oral BID  . triamcinolone ointment   Topical BID  . vitamin C  250 mg Oral BID    Continuous Infusions: . sodium chloride 50 mL/hr at 03/21/18 1049  . albumin human Stopped (03/21/18 1248)    PRN Meds: albuterol, docusate sodium,  loratadine, ondansetron **OR** ondansetron (ZOFRAN) IV  Physical Exam  Constitutional: She is oriented to person, place, and time. She appears cachectic. She is cooperative. She appears ill.  HENT:  Head: Normocephalic and atraumatic.  Pulmonary/Chest: No accessory muscle usage. No tachypnea. No respiratory distress.  Abdominal: There is no tenderness.  Neurological: She is alert and oriented to person, place, and time.  Skin: Skin is warm and dry. There is pallor.  Psychiatric: She has a normal mood and affect. Her speech is normal and behavior is normal. Cognition and memory are normal.  Nursing note and vitals reviewed.          Vital Signs: BP 137/62 (BP Location: Left Arm)   Pulse 88   Temp 98.3 F (36.8 C) (Oral)   Resp 18   Ht 5' (1.524 m)   Wt 40 kg   SpO2 100%   BMI 17.21 kg/m  SpO2: SpO2: 100 % O2 Device: O2 Device: Room Air O2 Flow Rate: O2 Flow Rate (L/min): 2 L/min  Intake/output summary:   Intake/Output Summary (Last 24 hours) at 03/21/2018 1559 Last data filed at 03/21/2018 1414 Gross per 24 hour  Intake 170 ml  Output -  Net 170 ml   LBM: Last BM Date: 03/20/18 Baseline Weight: Weight: 42.5 kg Most recent weight: Weight: 40 kg       Palliative Assessment/Data: PPS 40%   Flowsheet Rows     Most Recent Value  Intake Tab  Referral Department  Hospitalist  Unit at Time of Referral  Med/Surg Unit  Palliative Care Primary Diagnosis  -- [decompensated liver cirrhosis]  Palliative Care Type  New Palliative  care  Reason for referral  Clarify Goals of Care  Date first seen by Palliative Care  03/20/18  Clinical Assessment  Palliative Performance Scale Score  40%  Psychosocial & Spiritual Assessment  Palliative Care Outcomes  Patient/Family meeting held?  Yes  Who was at the meeting?  patient, daughter, son, stepfather  Palliative Care Outcomes  Clarified goals of care, Improved pain interventions, Improved non-pain symptom therapy, Provided end of life care assistance, Counseled regarding hospice, Provided psychosocial or spiritual support, ACP counseling assistance      Patient Active Problem List   Diagnosis Date Noted  . Palliative care by specialist   . Goals of care, counseling/discussion   . Generalized weakness   . Protein-calorie malnutrition, severe 03/20/2018  . Hepatic encephalopathy (HCC) 03/18/2018  . Ascites 12/30/2017  . Melena 12/30/2017  . Thrombocytopenia (HCC) 02/10/2015  . Physical deconditioning 07/03/2014  . COPD exacerbation (HCC) 07/03/2014  . Cirrhosis, non-alcoholic (HCC) 07/03/2014  . Essential hypertension, benign 07/03/2014  . Anemia, iron deficiency 07/03/2014  . Major depression 07/03/2014  . Esophageal varices in cirrhosis (HCC) 07/03/2014  . Secondary esophageal varices with bleeding (HCC) 06/06/2014  . Centrilobular emphysema (HCC) 12/24/2013  . Other and unspecified hyperlipidemia 12/24/2013  . Type II or unspecified type diabetes mellitus without mention of complication, uncontrolled 12/24/2013  . Hypothyroidism 12/24/2013    Palliative Care Assessment & Plan   Patient Profile: 72 y.o. 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 Allegra LaiVanga, ITP followed by Dr. Orlie DakinFinnegan, COPD on home O2 2L, GERD, DM, hypothyroidism, depression admitted on 03/18/2018 with confusion. Ammonia level 73. And received 1 dose of lactulose in ED. Patient with acute on chronic hepatic encephalopathy secondary to NASH cirrhosis. Not a liver  transplant candidate. GI and oncology notes reviewed. Palliative medicine consultation for goals of care.  Assessment: Hepatic encephalopathy Decompensated NASH liver cirrhosis Acute on CKD stage 4 Recurrent ascites Hx of idiopathic thrombocytopenic purpura Adult failure to thrive   Recommendations/Plan:  DNR/DNI. Continue current interventions and supportive care.   Living will documentation completed yesterday.  Durable DNR in place.  Children requesting PT f/u today for possible eligibility for SNF under rehab. SW following.  Educated adult children on outpatient palliative versus hospice options.   PMT will follow.  Code Status: DNR/DNI   Code Status Orders  (From admission, onward)         Start     Ordered   03/18/18 1504  Do not attempt resuscitation (DNR)  Continuous    Question Answer Comment  In the event of cardiac or respiratory ARREST Do not call a "code blue"   In the event of cardiac or respiratory ARREST Do not perform Intubation, CPR, defibrillation or ACLS   In the event of cardiac or respiratory ARREST Use medication by any route, position, wound care, and other measures to relive pain and suffering. May use oxygen, suction and manual treatment of airway obstruction as needed for comfort.   Comments RN may pronounce      03/18/18 1503        Code Status History    Date Active Date Inactive Code Status Order ID Comments User Context   12/30/2017 1015 01/02/2018 2007 Full Code [id number]  Altamese DillingVachhani, Vaibhavkumar, MD Inpatient    Advance Directive Documentation     Most Recent Value  Type of Advance Directive  Out of facility DNR (pink MOST or yellow form)  Pre-existing out of facility DNR order (yellow form or pink MOST form)  Yellow form placed in chart (order not valid for inpatient use)  "MOST" Form in Place?  -       Prognosis:   Unable to determine: Poor prognosis likely months with decompensated liver cirrhosis not a transplant candidate,  recurrent ascites, elevated ammonia, CKD with worsening kidney functions, and declining functional and nutritional status.   Discharge Planning:  To Be Determined  Care plan was discussed with patient, son, daughter, SW, Dr. Elisabeth PigeonVachhani  Thank you for allowing the Palliative Medicine Team to assist in the care of this patient.   Time In: 1200- 1345- Time Out: 1215 1430 Total Time 60min Prolonged Time Billed no      Greater than 50%  of this time was spent counseling and coordinating care related to the above assessment and plan.  Vennie HomansMegan Payten Beaumier, FNP-C Palliative Medicine Team  Phone: [phone] Fax: [phone]  Please contact Palliative Medicine Team phone at [phone] for questions and concerns.

## 2018-03-21 NOTE — Care Management Important Message (Signed)
Important Message  Patient Details  Name: Maite L Atkerson MRN:Ihor Dow [id number] Date of Birth: 10-11-1945   Medicare Important Message Given:  Yes    Olegario MessierKathy A Allysia Ingles 03/21/2018, 9:42 AM

## 2018-03-21 NOTE — Progress Notes (Signed)
Physical Therapy Treatment Patient Details Name: Angela DowJackie L Stooksbury MRN: [id number] DOB: [birth year] Today's Date: 03/21/2018    History of Present Illness Pt is a 72 y.o. female presenting to hospital 03/18/18 with increased weakness and AMS.  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  PMH includes nonalcoholic cirrhosis, COPD, ITP, depression, DM, Alzheimer's disease.    PT Comments    Pt reporting that she feels too weak to go home and wants to go to rehab to improve strength and functional mobility.  Pt reporting concerns regarding what she could not do but was agreeable to doing anything therapist recommended (even though she just got back to bed with nursing from toileting using BSC with 2 assist to transfer).  Pt able to progress to ambulating 3 feet forward and then 3 feet backwards (6 feet total) with youth sized RW (x2 trials) with assist; also able to stand 3 times from bed with walker with assist.  Pt paced with activities and demonstrated good participation during session.  D/t pt's progress with therapy and appropriate participation, pt appears appropriate for STR (nurse and SW notified of updated recommendation).  O2 sats 92% or greater on 2 L O2 via nasal cannula and HR WFL during session.  Will continue to progress pt with strengthening, balance, and progressive ambulation per pt tolerance.   Follow Up Recommendations  SNF     Equipment Recommendations  3in1 (PT);Wheelchair (measurements PT);Wheelchair cushion (measurements PT)(youth sized RW)    Recommendations for Other Services       Precautions / Restrictions Precautions Precautions: Fall Precaution Comments: Bruises easily Restrictions Weight Bearing Restrictions: No    Mobility  Bed Mobility Overal bed mobility: Needs Assistance Bed Mobility: Supine to Sit;Sit to Supine     Supine to sit: Supervision;HOB  elevated Sit to supine: Min assist   General bed mobility comments: increased effort and time for pt to perform supine to sit on own; min assist for B LE's sit to supine; pt requiring assist to scoot up in bed (pt unable to perform on own so therapist assisted using bed pad)  Transfers Overall transfer level: Needs assistance Equipment used: Rolling walker (2 wheeled)(youth sized) Transfers: Sit to/from Stand Sit to Stand: Min assist         General transfer comment: vc's for UE and LE placement; min assist to initiate and come to full stand x3 trials (pt felt dizzy on 2nd stand so pt sat down on bed briefly before standing again)  Ambulation/Gait Ambulation/Gait assistance: Min assist Gait Distance (Feet): (6 feet x2) Assistive device: Rolling walker (2 wheeled)(youth sized)   Gait velocity: decreased   General Gait Details: pt ambulated 3 feet forward and then 3 feet backward with youth sized RW with min assist to steady (x2 trials); decreased B step length and foot clearance noted; vc's required to take longer steps and assist required for walker management   Stairs             Wheelchair Mobility    Modified Rankin (Stroke Patients Only)       Balance Overall balance assessment: Needs assistance Sitting-balance support: Single extremity supported;Feet supported Sitting balance-Leahy Scale: Poor Sitting balance - Comments: pt requires at least single UE support for static sitting balance   Standing balance support: Single extremity supported Standing balance-Leahy Scale: Poor Standing balance comment: pt requires at least single UE support on RW for static standing balance once set  up (pt initially with B LE's against bed in standing for stability standing with RW)                            Cognition Arousal/Alertness: Awake/alert Behavior During Therapy: Anxious Overall Cognitive Status: Within Functional Limits for tasks assessed                                         Exercises      General Comments   Nursing cleared pt for participation in physical therapy.  Pt agreeable to PT session.  Pt's daughter present during session.      Pertinent Vitals/Pain Pain Assessment: No/denies pain    Home Living                      Prior Function            PT Goals (current goals can now be found in the care plan section) Acute Rehab PT Goals Patient Stated Goal: to feel stronger PT Goal Formulation: With patient Time For Goal Achievement: 04/02/18 Potential to Achieve Goals: Fair Progress towards PT goals: Progressing toward goals    Frequency    Min 2X/week      PT Plan Discharge plan needs to be updated(SW notified)    Co-evaluation              AM-PAC PT "6 Clicks" Daily Activity  Outcome Measure  Difficulty turning over in bed (including adjusting bedclothes, sheets and blankets)?: A Little Difficulty moving from lying on back to sitting on the side of the bed? : A Lot Difficulty sitting down on and standing up from a chair with arms (e.g., wheelchair, bedside commode, etc,.)?: Unable Help needed moving to and from a bed to chair (including a wheelchair)?: A Little Help needed walking in hospital room?: A Little Help needed climbing 3-5 steps with a railing? : Total 6 Click Score: 13    End of Session Equipment Utilized During Treatment: Gait belt Activity Tolerance: Patient tolerated treatment well Patient left: in bed;with call bell/phone within reach;with bed alarm set;with family/visitor present;Other (comment)(pt positioned towards L side; pillow placed to elevate B heels) Nurse Communication: Mobility status;Precautions PT Visit Diagnosis: Unsteadiness on feet (R26.81);Other abnormalities of gait and mobility (R26.89);Muscle weakness (generalized) (M62.81)     Time: [id number] PT Time Calculation (min) (ACUTE ONLY): 38 min  Charges:  $Therapeutic Activity: 38-52  mins                    Hendricks LimesEmily Terence Bart, PT 03/21/18, 5:25 PM [phone]

## 2018-03-21 NOTE — Care Management (Signed)
RNCM consult received for hospice services. Patient and family has decided to pursue SNF. CSW aware.

## 2018-03-21 NOTE — Plan of Care (Signed)

## 2018-03-22 DIAGNOSIS — Z515 Encounter for palliative care: Secondary | ICD-10-CM

## 2018-03-22 LAB — PROTEIN ELECTROPHORESIS, SERUM
A/G Ratio: 1.8 — ABNORMAL HIGH (ref 0.7–1.7)
ALPHA-1-GLOBULIN: 0.3 g/dL (ref 0.0–0.4)
Albumin ELP: 3.8 g/dL (ref 2.9–4.4)
Alpha-2-Globulin: 0.5 g/dL (ref 0.4–1.0)
Beta Globulin: 0.8 g/dL (ref 0.7–1.3)
Gamma Globulin: 0.5 g/dL (ref 0.4–1.8)
Globulin, Total: 2.1 g/dL — ABNORMAL LOW (ref 2.2–3.9)
TOTAL PROTEIN ELP: 5.9 g/dL — AB (ref 6.0–8.5)

## 2018-03-22 MED ORDER — GLYCOPYRROLATE 0.2 MG/ML IJ SOLN
0.2000 mg | INTRAMUSCULAR | Status: DC | PRN
  Filled 2018-03-22: qty 1

## 2018-03-22 NOTE — Progress Notes (Signed)
PT Cancellation Note  Patient Details Name: Ihor DowJackie L Loving MRN: [id number] DOB: 02-10-1946   Cancelled Treatment:    Reason Eval/Treat Not Completed: Other (comment).  PT order cancelled (pt now on comfort care).  Hendricks LimesEmily Odilon Cass, PT 03/22/18, 12:54 PM [phone]

## 2018-03-22 NOTE — Plan of Care (Signed)
  Problem: Education: Goal: Knowledge of General Education information will improve Description Including pain rating scale, medication(s)/side effects and non-pharmacologic comfort measures Outcome: Completed/Met   Problem: Health Behavior/Discharge Planning: Goal: Ability to manage health-related needs will improve Outcome: Completed/Met   Problem: Clinical Measurements: Goal: Ability to maintain clinical measurements within normal limits will improve Outcome: Completed/Met Goal: Will remain free from infection Outcome: Completed/Met Goal: Diagnostic test results will improve Outcome: Completed/Met Goal: Respiratory complications will improve Outcome: Completed/Met Goal: Cardiovascular complication will be avoided Outcome: Completed/Met   Problem: Activity: Goal: Risk for activity intolerance will decrease Outcome: Completed/Met   Problem: Nutrition: Goal: Adequate nutrition will be maintained Outcome: Completed/Met   Problem: Coping: Goal: Level of anxiety will decrease Outcome: Completed/Met   Problem: Elimination: Goal: Will not experience complications related to bowel motility Outcome: Completed/Met Goal: Will not experience complications related to urinary retention Outcome: Completed/Met   Problem: Pain Managment: Goal: General experience of comfort will improve Outcome: Completed/Met   Problem: Safety: Goal: Ability to remain free from injury will improve Outcome: Completed/Met   Problem: Skin Integrity: Goal: Risk for impaired skin integrity will decrease Outcome: Completed/Met   Problem: Education: Goal: Knowledge of the prescribed therapeutic regimen will improve Outcome: Completed/Met   Problem: Coping: Goal: Ability to identify and develop effective coping behavior will improve Outcome: Completed/Met   Problem: Clinical Measurements: Goal: Quality of life will improve Outcome: Completed/Met   Problem: Respiratory: Goal: Verbalizations of  increased ease of respirations will increase Outcome: Completed/Met   Problem: Pain Management: Goal: Satisfaction with pain management regimen will improve Outcome: Completed/Met

## 2018-03-22 NOTE — Progress Notes (Deleted)
Pt for discharge home. Waiting on son jay to come pick her up.  Sl d/cd.  No distress. Meds/diet/ activity and f/u discussed. Will reinforce with pt and son jay when he arrives. 

## 2018-03-22 NOTE — Progress Notes (Signed)
Daily Progress Note   Patient Name: Angela Austin       Date: 03/22/2018 DOB: [birth year]  Age: 72 y.o. MRN#: [id number] Attending Physician: Altamese Dilling, * Primary Care Physician: Kandyce Rud, MD Admit Date: 03/18/2018  Reason for Consultation/Follow-up: Establishing goals of care and Terminal Care  Subjective: Patient asleep. No s/s of distress.  GOC:  Family at bedside including patient's children and step-father. Christen Bame and Kennyth Arnold share that yesterday evening she told them she was "tired" and she no longer wanted any life prolonging measures including medications or IVF. She spoke of wanting to be kept "comfortable." She is not accepting food or drink for them. She slept well after ativan given. We again discussed her progressive cirrhosis and declining health in the last 5 years. She spoke to her family about these interventions not changing her "quality" of life. She is accepting and ready for the end of her life.   Discussed transition to comfort measures only including discontinuation of life-prolonging interventions. Educated on utilization of medications as needed to ensure comfort and relief from suffering. Educated on comfort feeds and EOL expectations.  We discussed plan to transfer her to hospice facility if she remains stable for transfer. All family at bedside agree with plan for hospice facility.   Length of Stay: 4  Current Medications: Scheduled Meds:    Continuous Infusions:   PRN Meds: albuterol, docusate sodium, glycopyrrolate, LORazepam, morphine injection, ondansetron **OR** ondansetron (ZOFRAN) IV  Physical Exam  Constitutional: She appears cachectic. She is sleeping. She appears ill.  HENT:  Head: Normocephalic and atraumatic.    Pulmonary/Chest: No accessory muscle usage. No tachypnea. No respiratory distress.  Abdominal: There is no tenderness.  Neurological:  Sleeping comfortably  Skin: Skin is warm and dry. There is pallor.  Psychiatric: She is inattentive.  Nursing note and vitals reviewed.          Vital Signs: BP (!) 175/70 (BP Location: Left Arm)   Pulse 95   Temp 97.9 F (36.6 C) (Oral)   Resp (!) 21   Ht 5' (1.524 m)   Wt 40 kg   SpO2 99%   BMI 17.21 kg/m  SpO2: SpO2: 99 % O2 Device: O2 Device: Nasal Cannula O2 Flow Rate: O2 Flow Rate (L/min): 2 L/min  Intake/output summary:   Intake/Output Summary (Last  24 hours) at 03/22/2018 1019 Last data filed at 03/22/2018 0900 Gross per 24 hour  Intake 581.97 ml  Output -  Net 581.97 ml   LBM: Last BM Date: 03/20/18 Baseline Weight: Weight: 42.5 kg Most recent weight: Weight: 40 kg       Palliative Assessment/Data: PPS 20%   Flowsheet Rows     Most Recent Value  Intake Tab  Referral Department  Hospitalist  Unit at Time of Referral  Med/Surg Unit  Palliative Care Primary Diagnosis  -- [decompensated liver cirrhosis]  Palliative Care Type  New Palliative care  Reason for referral  Clarify Goals of Care  Date first seen by Palliative Care  03/20/18  Clinical Assessment  Palliative Performance Scale Score  20%  Psychosocial & Spiritual Assessment  Palliative Care Outcomes  Patient/Family meeting held?  Yes  Who was at the meeting?  children at bedside  Palliative Care Outcomes  Clarified goals of care, Counseled regarding hospice, Provided psychosocial or spiritual support, Improved pain interventions, Improved non-pain symptom therapy, Provided end of life care assistance, Changed to focus on comfort, Transitioned to hospice      Patient Active Problem List   Diagnosis Date Noted  . Palliative care by specialist   . Goals of care, counseling/discussion   . Generalized weakness   . Acute renal failure (HCC)   . Protein-calorie  malnutrition, severe 03/20/2018  . Hepatic encephalopathy (HCC) 03/18/2018  . Ascites 12/30/2017  . Melena 12/30/2017  . Thrombocytopenia (HCC) 02/10/2015  . Physical deconditioning 07/03/2014  . COPD exacerbation (HCC) 07/03/2014  . Cirrhosis, non-alcoholic (HCC) 07/03/2014  . Essential hypertension, benign 07/03/2014  . Anemia, iron deficiency 07/03/2014  . Major depression 07/03/2014  . Esophageal varices in cirrhosis (HCC) 07/03/2014  . Secondary esophageal varices with bleeding (HCC) 06/06/2014  . Centrilobular emphysema (HCC) 12/24/2013  . Other and unspecified hyperlipidemia 12/24/2013  . Type II or unspecified type diabetes mellitus without mention of complication, uncontrolled 12/24/2013  . Hypothyroidism 12/24/2013    Palliative Care Assessment & Plan   Patient Profile: 72 y.o. 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 Allegra LaiVanga, ITP followed by Dr. Orlie DakinFinnegan, COPD on home O2 2L, GERD, DM, hypothyroidism, depression admitted on 03/18/2018 with confusion. Ammonia level 73. And received 1 dose of lactulose in ED. Patient with acute on chronic hepatic encephalopathy secondary to NASH cirrhosis. Not a liver transplant candidate. GI and oncology notes reviewed. Palliative medicine consultation for goals of care.   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 She does not want further life-prolonging or heroic interventions including medications and IVF. She wishes to be kept comfort. She is declining food and drink.  DNR/DNI. Discontinued interventions not aimed at comfort.   Symptom management  Morphine 1mg  IV q2h prn pain/dyspnea/air hunger  Ativan 1mg  IV q4h prn anxiety  Robinul 0.2mg  IV q4h prn  secretions  SW consulted for residential hospice placement.   Code Status: DNR/DNI   Code Status Orders  (From admission, onward)         Start     Ordered   03/18/18 1504  Do not attempt resuscitation (DNR)  Continuous    Question Answer Comment  In the event of cardiac or respiratory ARREST Do not call a "code blue"   In  the event of cardiac or respiratory ARREST Do not perform Intubation, CPR, defibrillation or ACLS   In the event of cardiac or respiratory ARREST Use medication by any route, position, wound care, and other measures to relive pain and suffering. May use oxygen, suction and manual treatment of airway obstruction as needed for comfort.   Comments RN may pronounce      03/18/18 1503        Code Status History    Date Active Date Inactive Code Status Order ID Comments User Context   12/30/2017 1015 01/02/2018 2007 Full Code [id number]  Altamese Dilling, MD Inpatient    Advance Directive Documentation     Most Recent Value  Type of Advance Directive  Out of facility DNR (pink MOST or yellow form)  Pre-existing out of facility DNR order (yellow form or pink MOST form)  Yellow form placed in chart (order not valid for inpatient use)  "MOST" Form in Place?  -       Prognosis:   < 2 weeks: Poor prognosis with decompensated liver cirrhosis not a transplant candidate, recurrent ascites, elevated ammonia, CKD with worsening kidney functions, and declining functional and nutritional status. Patient no longer eating.   Discharge Planning:  Hospice facility  Care plan was discussed with patient, son, daughter, SW, Dr. Elisabeth Pigeon, Dr. Cherylann Ratel  Thank you for allowing the Palliative Medicine Team to assist in the care of this patient.   Time In: 0945 Time Out: 1010 Total Time Prolonged Time Billed no      Greater than 50%  of this time was spent counseling and coordinating care related to the above assessment and plan.  Vennie Homans, FNP-C Palliative  Medicine Team  Phone: [phone] Fax: [phone]  Please contact Palliative Medicine Team phone at [phone] for questions and concerns.

## 2018-03-22 NOTE — Clinical Social Work Note (Signed)
CSW notified by Palliative NP that patient and family have now decided for comfort measures. CSW spoke with daughter Modesta MessingStacy Gregory and son Adolphus BirchwoodRonnie Bouse at bedside regarding comfort care. Family is interested in residential hospice at [facility]. CSW notified Clydie BraunKaren, hospice liaison of referral. CSW will continue to follow for emotional support and transition to hospice home.   Ruthe Mannanandace Deitrick Ferreri MSW, [address]CSWA [phone]

## 2018-03-22 NOTE — Progress Notes (Signed)
New hospice home referral received from [facility] following a Palliative Medicine consult. Patient is a 72 year old woman with a known history of NASH liver cirrhosis, ITP, COPD on home oxygen, GERD, DM II, hypothyroidism and depression admitted to [facility] on 8/18 with confusion. Labs showed elevated ammonia level and she received a dose of lactulose in the ED.  She has acute on chronic hepatic encephalopathy secondary to NASH liver cirrhosis, not a liver transplant candidate. Palliative medicine consulted for goals of care. Palliative NP Ihor Dow met with family over the last few days. Patient has continued to decline, with increased weakness and poor oral intake. Patient has stated that she is tired and "done". Family has chosen to focus on comfort with a transfer to the hospice home. Writer met in the room with patient's son Edd Arbour, daughter Marzetta Board and step father Rush Landmark to initiate education regarding hospice services, philosophy and team approach to care with good understanding voiced. Questions answered, consents signed. Hospice information and contact numbers given to [facility]. Patient information faxed to referral. Plan is for discharge to the hospice home tomorrow. Family and hospital care team updated. Thank you for the opportunity to be involved in the care of this patient and her family. Flo Shanks RN, BSN, [place] [facility], hospital Liaison [phone]

## 2018-03-22 NOTE — Progress Notes (Signed)
[employer] [facility] [place] [facility]   PATIENT NAME: Angela Austin    MR#:  [id number]  DATE OF BIRTH:  1946-07-29  SUBJECTIVE:  CHIEF COMPLAINT:   Chief Complaint  Patient presents with  . Weakness   Drowsy.  REVIEW OF SYSTEMS:  Pt is drowsy, on comfort care.  DRUG ALLERGIES:   Allergies  Allergen Reactions  . Metformin Diarrhea  . Amoxicillin-Pot Clavulanate Other (See Comments)    Other reaction(s): Unknown  . Codeine   . Erythromycin   . Erythromycin Ethylsuccinate     Other reaction(s): Unknown  . Levofloxacin Other (See Comments)    Jittery, nausea  . Metformin And Related     GI bleed  . Morphine Other (See Comments) and Nausea And Vomiting    caused severe cramping   . Morphine And Related   . Morphine Sulfate Er Beads Other (See Comments)    caused severe cramping   . Spiriva Handihaler [Tiotropium Bromide Monohydrate] Swelling    Tongue  . Statins Other (See Comments)    Other reaction(s): Muscle Pain  . Tetracyclines & Related Other (See Comments)  . Ace Inhibitors Rash   VITALS:  Blood pressure (!) 175/70, pulse 95, temperature 97.9 F (36.6 C), temperature source Oral, resp. rate (!) 21, height 5' (1.524 m), weight 40 kg, SpO2 99 %. PHYSICAL EXAMINATION:  Physical Exam  Constitutional: She appears cachectic.  HENT:  Head: Normocephalic and atraumatic.  Eyes: Pupils are equal, round, and reactive to light. Conjunctivae are normal.  Neck: Neck supple. No tracheal deviation present. No thyromegaly present.  Cardiovascular: Normal rate, regular rhythm and normal heart sounds.  Pulmonary/Chest: Effort normal and breath sounds normal. No respiratory distress. She has no wheezes. She exhibits no tenderness.  Abdominal: Soft. She exhibits ascites. She exhibits no distension. Bowel sounds are decreased. There is no tenderness.  Musculoskeletal: Normal range of motion.  Neurological: No cranial nerve deficit.  Pt is drowsy, no  distress.  Skin: Skin is warm and dry. No rash noted.   LABORATORY PANEL:  Female CBC Recent Labs  Lab 03/21/18 0409  WBC 4.7  HGB 9.3*  HCT 28.1*  PLT 64*   ------------------------------------------------------------------------------------------------------------------ Chemistries  Recent Labs  Lab 03/20/18 0434 03/21/18 0409  NA 138 140  K 4.4 4.5  CL 98 99  CO2 32 31  GLUCOSE 139* 175*  BUN 64* 62*  CREATININE 2.19* 2.19*  CALCIUM 8.9 9.0  AST 31  --   ALT 24  --   ALKPHOS 92  --   BILITOT 1.3*  --    RADIOLOGY:  US Renal  Result Date: 03/21/2018 CLINICAL DATA:  Acute renal failure. EXAM: RENAL / URINARY TRACT ULTRASOUND COMPLETE COMPARISON:  CT abdomen and pelvis 06/26/2014. FINDINGS: Right Kidney: Length: 8.6 cm. Echogenicity within normal limits. No mass or hydronephrosis visualized. Left Kidney: Length: 8.9 cm. Echogenicity within normal limits. No mass or hydronephrosis visualized. Bladder: Appears normal for degree of bladder distention. Other findings: Visualized liver demonstrates changes of cirrhosis. Moderate volume of ascites is present in the abdomen and pelvis. IMPRESSION: No acute abnormality.  Negative for hydronephrosis. Cirrhotic liver and moderate abdominal ascites. Electronically Signed   By: Drusilla Kanner M.D.   On: 03/21/2018 15:01   ASSESSMENT AND PLAN:  Angela Austin  is a 71 y.o. female with a known history of liver cirrhosis secondary to Nash, diabetes metas, GERD, hypothyroidism, diabetes thrombus cytopenic purpura sees Dr. Orlie Dakin as an outpatient and following Dr. Allegra Lai  offers liver cirrhosis is brought in to the ED as she is altered.  Ammonia level is at 73.  Patient was given 1 dose of lactulose and following that she had a bowel movement and during my examination she started answering few questions.  # Acute on chronic hepatic encephalopathy - increase Lactulose to tid, will titrate the dose until patient has 2 soft bowel  movements - Ammonia 74->101, monitor - came down now. - Hepatic encephalopathy from liver cirrhosis likely secondary to [employer] Get neurochecks and monitor patient clinically Continue lactulose, stop LasiX and Aldactone for now as kidney function worsened Now family have agreed to Comfort care. Waiting on hospice home arrangements.  * Acute on CKD 4 - creat worsened some, will stop lasix and aldactoine - c/s nephro   # hypothyroidism: Increase dose of Synthroid as TSH > 35  #Diabetes mellitus non-insulin-dependent Insulin sliding scale with Accu-Cheks - DM nurse following  #History of idiopathic thrombocytopenic purpura Some bruises are noticed on the skin but no active bleeding.  Continue to monitor platelet count, platelets 76 -> 64. Stop lovenox   # failure to thrive Patient is unable to get out of the bed  - appreciated palliative care input  hospice home arrangements now,.  # Ac renal failure   Appreciated nephro help, IV albumin and IV fluids.  physical therapy recommends HHPT  Hospice home placement awaited.   All the records are reviewed and case discussed with Care Management/Social Worker. Management plans discussed with the patient, family (daughter at bedside) and they are in agreement.  CODE STATUS: DNR  TOTAL TIME TAKING CARE OF THIS PATIENT: 40 minutes.   More than 50% of the time was spent in counseling/coordination of care: YES  POSSIBLE D/C IN 2-3 DAYS, DEPENDING ON CLINICAL CONDITION.   Altamese DillingVaibhavkumar Neziah Braley M.D on 03/22/2018 at 2:31 PM  Between 7am to 6pm - Pager - [phone]  After 6pm go to [email or web address] - Social research officer, [email or web address] [email or web address]  [facility] [place] [facility]  Office  [phone]  CC: Primary care physician; Kandyce RudBabaoff, Marcus, MD  Note: This dictation was prepared with Dragon dictation along with smaller phrase technology. Any transcriptional errors that result from this process are unintentional.

## 2018-03-23 MED ORDER — GLYCOPYRROLATE 0.2 MG/ML IJ SOLN: 0.2000 mg | INTRAMUSCULAR | 0 refills | Status: AC | PRN

## 2018-03-23 MED ORDER — ALBUTEROL SULFATE (2.5 MG/3ML) 0.083% IN NEBU: 1.2500 mg | INHALATION_SOLUTION | Freq: Four times a day (QID) | RESPIRATORY_TRACT | 12 refills | Status: AC | PRN

## 2018-03-23 MED ORDER — MORPHINE SULFATE (PF) 2 MG/ML IV SOLN: 1.0000 mg | INTRAVENOUS | 0 refills | Status: AC | PRN

## 2018-03-23 MED ORDER — LORAZEPAM 2 MG/ML IJ SOLN: 1.0000 mg | INTRAMUSCULAR | 0 refills | Status: AC | PRN

## 2018-03-23 NOTE — Progress Notes (Signed)
Follow up visit made to new hospice home referral. Patient seen sitting up in bed, denied pain, appeared short of breath with talking. Currently on 3 liters nasal cannula. IV l;orazepam given last night 1 mg IV as patient has had difficulty sleeping. Plan continues for discharge to the hospice home. Report called to the hospice home, EMS notified for transport. Family, patient and hospital care team all updated. Thank you. Dayna BarkerKaren Robertson RN, BSN, [facility] [place], [facility] Liaison [phone]

## 2018-03-23 NOTE — Discharge Summary (Signed)
[facility] [place] at [facility]   PATIENT NAME: Angela Austin    MR#:  [id number]  DATE OF BIRTH:  [birth year]  DATE OF ADMISSION:  03/18/2018 ADMITTING PHYSICIAN: Ramonita Lab, MD  DATE OF DISCHARGE: 03/23/2018   PRIMARY CARE PHYSICIAN: Kandyce Rud, MD    ADMISSION DIAGNOSIS:  Hepatic encephalopathy (HCC) [K72.90] Other ascites [R18.8] Cirrhosis, non-alcoholic (HCC) [K74.60] Generalized weakness [R53.1] Cirrhosis of liver with ascites, unspecified hepatic cirrhosis type (HCC) [K74.60, R18.8]  DISCHARGE DIAGNOSIS:  Active Problems:   Cirrhosis of liver with ascites (HCC)   Hepatic encephalopathy (HCC)   Protein-calorie malnutrition, severe   Palliative care by specialist   Goals of care, counseling/discussion   Generalized weakness   Acute renal failure (HCC)   Terminal care   SECONDARY DIAGNOSIS:   Past Medical History:  Diagnosis Date  . Cirrhosis of liver not due to alcohol (HCC)   . COPD (chronic obstructive pulmonary disease) (HCC)   . Depression   . Diabetes (HCC)   . GERD (gastroesophageal reflux disease)   . Hypothyroidism   . ITP (idiopathic thrombocytopenic purpura)   . Nephrolithiasis     HOSPITAL COURSE:   [name] a72 y.o.femalewith a known history of liver cirrhosis secondary to [place], diabetes metas, GERD, hypothyroidism, diabetes thrombus cytopenic purpura sees Dr. Orlie Dakin as an outpatient and following Dr. Allegra Lai offers liver cirrhosis is brought in to the ED as she is altered.Ammonia level is at 73. Patient was given 1 dose of lactulose and following that she had a bowel movement and during my examination she started answering few questions.  # Acute on chronic hepatic encephalopathy - increase Lactulose to tid,will titrate the dose until patient has 2 soft bowel movements - Ammonia 74->101, monitor - came down now. - Hepatic encephalopathy from liver cirrhosis likely secondary to [employer] and monitor patient clinically Continue lactulose, stop LasiXand Aldactone for now as kidney function worsened Now family have agreed to Comfort care. Waiting on hospice home arrangements.  * Acute on CKD 4 - creat worsened some, will stop lasix and aldactoine - c/s nephro  - comfort care now.  # hypothyroidism: Increase dose of Synthroid as TSH > 35  #Diabetes mellitus non-insulin-dependent Insulin sliding scale with Accu-Cheks - DM nurse following  #History of idiopathic thrombocytopenic purpura Some bruises are noticed on the skin but no active bleeding. Continue to monitor platelet count, platelets 76 -> 64. Stop lovenox   # failure to thrive Patient is unable to get out of the bed  - appreciated palliative care input  hospice home arrangements now,.  # Ac renal failure   Appreciated nephro help, IV albumin and IV fluids.  now comfort care.  DISCHARGE CONDITIONS:   Stable.  CONSULTS OBTAINED:  Treatment Team:  Mady Haagensen, MD  DRUG ALLERGIES:   Allergies  Allergen Reactions  . Metformin Diarrhea  . Amoxicillin-Pot Clavulanate Other (See Comments)    Other reaction(s): Unknown  . Codeine   . Erythromycin   . Erythromycin Ethylsuccinate     Other reaction(s): Unknown  . Levofloxacin Other (See Comments)    Jittery, nausea  . Metformin And Related     GI bleed  . Morphine Other (See Comments) and Nausea And Vomiting    caused severe cramping   . Morphine And Related   . Morphine Sulfate Er Beads Other (See Comments)    caused severe cramping   . Spiriva Handihaler [Tiotropium Bromide Monohydrate] Swelling    Tongue  .  Statins Other (See Comments)    Other reaction(s): Muscle Pain  . Tetracyclines & Related Other (See Comments)  . Ace Inhibitors Rash    DISCHARGE MEDICATIONS:   Allergies as of 03/23/2018      Reactions   Metformin Diarrhea   Amoxicillin-pot Clavulanate Other (See Comments)   Other reaction(s): Unknown    Codeine    Erythromycin    Erythromycin Ethylsuccinate    Other reaction(s): Unknown   Levofloxacin Other (See Comments)   Jittery, nausea   Metformin And Related    GI bleed   Morphine Other (See Comments), Nausea And Vomiting   caused severe cramping    Morphine And Related    Morphine Sulfate Er Beads Other (See Comments)   caused severe cramping    Spiriva Handihaler [tiotropium Bromide Monohydrate] Swelling   Tongue   Statins Other (See Comments)   Other reaction(s): Muscle Pain   Tetracyclines & Related Other (See Comments)   Ace Inhibitors Rash      Medication List    STOP taking these medications   albumin human 25 % bottle   albuterol 1.25 MG/3ML nebulizer solution Commonly known as:  ACCUNEB Replaced by:  albuterol (2.5 MG/3ML) 0.083% nebulizer solution   FLUoxetine 20 MG tablet Commonly known as:  PROZAC   fluticasone 50 MCG/ACT nasal spray Commonly known as:  FLONASE   furosemide 40 MG tablet Commonly known as:  LASIX   glyBURIDE 2.5 MG tablet Commonly known as:  DIABETA   levothyroxine 75 MCG tablet Commonly known as:  SYNTHROID, LEVOTHROID   loratadine 10 MG tablet Commonly known as:  CLARITIN   pantoprazole 40 MG tablet Commonly known as:  PROTONIX   PROVENTIL HFA 108 (90 Base) MCG/ACT inhaler Generic drug:  albuterol   spironolactone 100 MG tablet Commonly known as:  ALDACTONE     TAKE these medications   albuterol (2.5 MG/3ML) 0.083% nebulizer solution Commonly known as:  PROVENTIL Take 1.5 mLs (1.25 mg total) by nebulization every 6 (six) hours as needed for wheezing. Replaces:  albuterol 1.25 MG/3ML nebulizer solution   glycopyrrolate 0.2 MG/ML injection Commonly known as:  ROBINUL Inject 1 mL (0.2 mg total) into the vein every 4 (four) hours as needed (secretions).   LORazepam 2 MG/ML injection Commonly known as:  ATIVAN Inject 0.5 mLs (1 mg total) into the vein every 4 (four) hours as needed for anxiety.   morphine 2 MG/ML  injection Inject 0.5 mLs (1 mg total) into the vein every 2 (two) hours as needed.        DISCHARGE INSTRUCTIONS:    Follow for comfort care at hospice home.  If you experience worsening of your admission symptoms, develop shortness of breath, life threatening emergency, suicidal or homicidal thoughts you must seek medical attention immediately by calling 911 or calling your MD immediately  if symptoms less severe.  You Must read complete instructions/literature along with all the possible adverse reactions/side effects for all the Medicines you take and that have been prescribed to you. Take any new Medicines after you have completely understood and accept all the possible adverse reactions/side effects.   Please note  You were cared for by a hospitalist during your hospital stay. If you have any questions about your discharge medications or the care you received while you were in the hospital after you are discharged, you can call the unit and asked to speak with the hospitalist on call if the hospitalist that took care of you is not available.  Once you are discharged, your primary care physician will handle any further medical issues. Please note that NO REFILLS for any discharge medications will be authorized once you are discharged, as it is imperative that you return to your primary care physician (or establish a relationship with a primary care physician if you do not have one) for your aftercare needs so that they can reassess your need for medications and monitor your lab values.    Today   CHIEF COMPLAINT:   Chief Complaint  Patient presents with  . Weakness    HISTORY OF PRESENT ILLNESS:  Angela Austin  is a 72 y.o. female with a known history of liver cirrhosis secondary to [place], diabetes metas, GERD, hypothyroidism, diabetes thrombus cytopenic purpura sees Dr. Orlie DakinFinnegan as an outpatient and following Dr. Allegra LaiVanga offers liver cirrhosis is brought in to the ED as she is  altered.  Ammonia level is at 73.  Patient was given 1 dose of lactulose and following that she had a bowel movement and during my examination she started answering few questions.  According to the family members patient is feeling very weak and unable to get out of the bed for the past few days Denies any fever, nausea or vomiting.  No abdominal pain.  VITAL SIGNS:  Blood pressure (!) 127/53, pulse 86, temperature 98 F (36.7 C), resp. rate (!) 22, height 5' (1.524 m), weight 40 kg, SpO2 96 %.  I/O:  No intake or output data in the 24 hours ending 03/23/18 1036  PHYSICAL EXAMINATION:   Constitutional: She appears cachectic.  HENT:  Head: Normocephalic and atraumatic.  Eyes: Pupils are equal, round, and reactive to light. Conjunctivae are normal.  Neck: Neck supple. No tracheal deviation present. No thyromegaly present.  Cardiovascular: Normal rate, regular rhythm and normal heart sounds.  Pulmonary/Chest: Effort normal and breath sounds normal. No respiratory distress. She has no wheezes. She exhibits no tenderness.  Abdominal: Soft. She exhibits ascites. She exhibits no distension. Bowel sounds are decreased. There is no tenderness.  Musculoskeletal: Normal range of motion.  Neurological: No cranial nerve deficit.  Pt is drowsy, no distress.  Skin: Skin is warm and dry. No rash noted.   DATA REVIEW:   CBC Recent Labs  Lab 03/21/18 0409  WBC 4.7  HGB 9.3*  HCT 28.1*  PLT 64*    Chemistries  Recent Labs  Lab 03/20/18 0434 03/21/18 0409  NA 138 140  K 4.4 4.5  CL 98 99  CO2 32 31  GLUCOSE 139* 175*  BUN 64* 62*  CREATININE 2.19* 2.19*  CALCIUM 8.9 9.0  AST 31  --   ALT 24  --   ALKPHOS 92  --   BILITOT 1.3*  --     Cardiac Enzymes Recent Labs  Lab 03/18/18 1104  TROPONINI <0.03    Microbiology Results  Results for orders placed or performed during the hospital encounter of 12/30/17  Body fluid culture     Status: None   Collection Time: 12/30/17  2:41  PM  Result Value Ref Range Status   Specimen Description   Final    PERITONEAL Performed at [facility], [address] [place], [place] [address]    Special Requests   Final    NONE Performed at [facility], [address] [place], [place] [address]    Gram Stain   Final    WBC PRESENT,BOTH PMN AND MONONUCLEAR NO ORGANISMS SEEN CYTOSPIN SMEAR    Culture  Final    NO GROWTH 3 DAYS Performed at [facility], [address] [place], [place] [address]    Report Status 01/03/2018 FINAL  Final    RADIOLOGY:  US Renal  Result Date: 03/21/2018 CLINICAL DATA:  Acute renal failure. EXAM: RENAL / URINARY TRACT ULTRASOUND COMPLETE COMPARISON:  CT abdomen and pelvis 06/26/2014. FINDINGS: Right Kidney: Length: 8.6 cm. Echogenicity within normal limits. No mass or hydronephrosis visualized. Left Kidney: Length: 8.9 cm. Echogenicity within normal limits. No mass or hydronephrosis visualized. Bladder: Appears normal for degree of bladder distention. Other findings: Visualized liver demonstrates changes of cirrhosis. Moderate volume of ascites is present in the abdomen and pelvis. IMPRESSION: No acute abnormality.  Negative for hydronephrosis. Cirrhotic liver and moderate abdominal ascites. Electronically Signed   By: Drusilla Kanner M.D.   On: 03/21/2018 15:01    EKG:   Orders placed or performed during the hospital encounter of 03/18/18  . ED EKG  . ED EKG      Management plans discussed with the patient, family and they are in agreement.  CODE STATUS:     Code Status Orders  (From admission, onward)         Start     Ordered   03/18/18 1504  Do not attempt resuscitation (DNR)  Continuous    Question Answer Comment  In the event of cardiac or respiratory ARREST Do not call a "code blue"   In the event of cardiac or respiratory ARREST Do not perform Intubation, CPR, defibrillation or ACLS   In the event of cardiac or respiratory ARREST Use  medication by any route, position, wound care, and other measures to relive pain and suffering. May use oxygen, suction and manual treatment of airway obstruction as needed for comfort.   Comments RN may pronounce      03/18/18 1503        Code Status History    Date Active Date Inactive Code Status Order ID Comments User Context   12/30/2017 1015 01/02/2018 2007 Full Code [id number]  Altamese Dilling, MD Inpatient    Advance Directive Documentation     Most Recent Value  Type of Advance Directive  Out of facility DNR (pink MOST or yellow form)  Pre-existing out of facility DNR order (yellow form or pink MOST form)  Yellow form placed in chart (order not valid for inpatient use)  "MOST" Form in Place?  -      TOTAL TIME TAKING CARE OF THIS PATIENT: 35 minutes.    Altamese Dilling M.D on 03/23/2018 at 10:36 AM  Between 7am to 6pm - Pager - [phone]  After 6pm go to [email or web address] - password [email or web address]  [facility]  Office  [phone]  CC: Primary care physician; Kandyce Rud, MD   Note: This dictation was prepared with Dragon dictation along with smaller phrase technology. Any transcriptional errors that result from this process are unintentional.

## 2018-03-23 NOTE — Discharge Instructions (Signed)
Comfort care  °

## 2018-03-23 NOTE — Progress Notes (Signed)
Pt discharged via non-emergent [facility] to [facility]. Report called to accepting RN. IV intact, pt on 3L 02 and in no distress. Otilio JeffersonMadelyn S Fenton, RN

## 2018-03-23 NOTE — Clinical Social Work Note (Signed)
Patient will be discharged to [facility] today. Family is aware and at bedside. Patient will be transported by EMS.   Ruthe Mannanandace Tadeo Besecker MSW, [address] [phone]

## 2018-03-23 NOTE — Care Management Important Message (Signed)
Important Message  Patient Details  Name: Angela Austin MRN: [id number] Date of Birth: 09-Jan-1946   Medicare Important Message Given:  Yes    Olegario MessierKathy A Dacian Orrico 03/23/2018, 11:06 AM

## 2018-03-24 LAB — BLOOD GAS, VENOUS
PATIENT TEMPERATURE: 37
pCO2, Ven: 61 mmHg — ABNORMAL HIGH (ref 44.0–60.0)
pH, Ven: 7.41 (ref 7.250–7.430)

## 2018-04-05 ENCOUNTER — Encounter: Payer: Self-pay | Admitting: Anesthesiology

## 2018-04-05 ENCOUNTER — Ambulatory Visit: Admission: RE | Admit: 2018-04-05 | Source: Ambulatory Visit | Admitting: Gastroenterology

## 2018-04-05 SURGERY — ESOPHAGOGASTRODUODENOSCOPY (EGD) WITH PROPOFOL
Anesthesia: General

## 2018-06-01 ENCOUNTER — Other Ambulatory Visit: Payer: Self-pay | Admitting: *Deleted

## 2018-06-01 DIAGNOSIS — D509 Iron deficiency anemia, unspecified: Secondary | ICD-10-CM

## 2018-06-03 NOTE — Progress Notes (Deleted)
[facility]  [phone] [phone]  ID: Angela Austin OB: [birth year]  MR#: [id number]  QIO#:[id number]  Patient Care Team: Kandyce Rud, MD as PCP - General (Family Medicine)  CHIEF COMPLAINT: Thrombocytopenia, iron deficiency anemia.  INTERVAL HISTORY: Patient returns to clinic today for repeat laboratory work and further evaluation.  She continues to have a poor appetite and weight loss.  She has chronic weakness and fatigue which is unchanged.  She otherwise feels well.  She denies any recent fevers or illnesses.  She has no neurologic complaints.  She has chronic shortness of breath and requires oxygen 24 hours a day.  She denies any chest pain, cough, or hemoptysis. She denies any nausea, vomiting, constipation, or diarrhea. She has no melena or hematochezia. She has no urinary complaints.  Patient offers no further specific complaints today.  REVIEW OF SYSTEMS:   Review of Systems  Constitutional: Positive for malaise/fatigue and weight loss. Negative for fever.  Respiratory: Positive for shortness of breath. Negative for cough and hemoptysis.   Cardiovascular: Negative.  Negative for chest pain and leg swelling.  Gastrointestinal: Negative for abdominal pain, blood in stool, diarrhea, melena, nausea and vomiting.  Genitourinary: Negative.  Negative for hematuria.  Musculoskeletal: Negative.  Negative for back pain.  Neurological: Positive for weakness. Negative for sensory change.  Endo/Heme/Allergies: Does not bruise/bleed easily.  Psychiatric/Behavioral: Negative.  The patient is not nervous/anxious.     As per HPI. Otherwise, a complete review of systems is negative.  PAST MEDICAL HISTORY: Past Medical History:  Diagnosis Date  . Cirrhosis of liver not due to alcohol (HCC)   . COPD (chronic obstructive pulmonary disease) (HCC)   . Depression   . Diabetes (HCC)   . GERD (gastroesophageal reflux disease)   . Hypothyroidism    . ITP (idiopathic thrombocytopenic purpura)   . Nephrolithiasis     PAST SURGICAL HISTORY: Past Surgical History:  Procedure Laterality Date  . CATARACT EXTRACTION    . ESOPHAGOGASTRODUODENOSCOPY N/A 12/31/2017   Procedure: ESOPHAGOGASTRODUODENOSCOPY (EGD);  Surgeon: Toney Reil, MD;  [facility] END[facility]COPY;  Service: Gastroenterology;  Laterality: N/A;  . ESOPHAGOGASTRODUODENOSCOPY (EGD) WITH PROPOFOL N/A 01/25/2018   Procedure: ESOPHAGOGASTRODUODENOSCOPY (EGD) WITH PROPOFOL;  Surgeon: Toney Reil, MD;  Location[facility] ENDOSCOPY;  Service: Gastroenterology;  Laterality: N/A;  . IR RADIOLOGIST EVAL & MGMT  02/15/2018  . LUMBAR DISC SURGERY    . TONSILLECTOMY AND ADENOIDECTOMY      FAMILY HISTORY Family History  Problem Relation Age of Onset  . Alzheimer's disease Mother   . COPD Mother   . Cancer Mother   . COPD Father   . Diabetes Mellitus II Father   . Heart attack Father        ADVANCED DIRECTIVES:    HEALTH MAINTENANCE: Social History   Tobacco Use  . Smoking status: Former Smoker    Years: 25.00    Types: Cigarettes  . Smokeless tobacco: Never Used  Substance Use Topics  . Alcohol use: No    Alcohol/week: 0.0 standard drinks  . Drug use: No     Colonoscopy:  PAP:  Bone density:  Lipid panel:  Allergies  Allergen Reactions  . Metformin Diarrhea  . Amoxicillin-Pot Clavulanate Other (See Comments)    Other reaction(s): Unknown  . Codeine   . Erythromycin   . Erythromycin Ethylsuccinate     Other reaction(s): Unknown  . Levofloxacin Other (See Comments)    Jittery, nausea  . Metformin And Related  GI bleed  . Morphine Other (See Comments) and Nausea And Vomiting    caused severe cramping   . Morphine And Related   . Morphine Sulfate Er Beads Other (See Comments)    caused severe cramping   . Spiriva Handihaler [Tiotropium Bromide Monohydrate] Swelling    Tongue  . Statins Other (See Comments)    Other reaction(s): Muscle Pain    . Tetracyclines & Related Other (See Comments)  . Ace Inhibitors Rash    Current Outpatient Medications  Medication Sig Dispense Refill  . albuterol (PROVENTIL) (2.5 MG/3ML) 0.083% nebulizer solution Take 1.5 mLs (1.25 mg total) by nebulization every 6 (six) hours as needed for wheezing. 75 mL 12  . glycopyrrolate (ROBINUL) 0.2 MG/ML injection Inject 1 mL (0.2 mg total) into the vein every 4 (four) hours as needed (secretions). 1 mL 0  . LORazepam (ATIVAN) 2 MG/ML injection Inject 0.5 mLs (1 mg total) into the vein every 4 (four) hours as needed for anxiety. 1 mL 0  . morphine 2 MG/ML injection Inject 0.5 mLs (1 mg total) into the vein every 2 (two) hours as needed. 1 mL 0   No current facility-administered medications for this visit.     OBJECTIVE: There were no vitals filed for this visit.   There is no height or weight on file to calculate BMI.    ECOG FS:1 - Symptomatic but completely ambulatory  General: Well-developed, well-nourished, no acute distress. Eyes: Pink conjunctiva, anicteric sclera. HEENT: Normocephalic, moist mucous membranes. Lungs: Clear to auscultation bilaterally. Heart: Regular rate and rhythm. No rubs, murmurs, or gallops. Abdomen: Soft, nontender, nondistended. No organomegaly noted, normoactive bowel sounds. Musculoskeletal: No edema, cyanosis, or clubbing. Neuro: Alert, answering all questions appropriately. Cranial nerves grossly intact. Skin: No rashes or petechiae noted. Psych: Normal affect.  LAB RESULTS:  Lab Results  Component Value Date   NA 140 03/21/2018   K 4.5 03/21/2018   CL 99 03/21/2018   CO2 31 03/21/2018   GLUCOSE 175 (H) 03/21/2018   BUN 62 (H) 03/21/2018   CREATININE 2.19 (H) 03/21/2018   CALCIUM 9.0 03/21/2018   PROT 5.3 (L) 03/20/2018   ALBUMIN 3.0 (L) 03/20/2018   AST 31 03/20/2018   ALT 24 03/20/2018   ALKPHOS 92 03/20/2018   BILITOT 1.3 (H) 03/20/2018   GFRNONAA 21 (L) 03/21/2018   GFRAA 25 (L) 03/21/2018    Lab  Results  Component Value Date   WBC 4.7 03/21/2018   NEUTROABS 4.2 03/18/2018   HGB 9.3 (L) 03/21/2018   HCT 28.1 (L) 03/21/2018   MCV 82.2 03/21/2018   PLT 64 (L) 03/21/2018     STUDIES: No results found.  ASSESSMENT: Thrombocytopenia, iron deficiency anemia.Marland Kitchen  PLAN:    1. Thrombocytopenia: Likely secondary to her underlying cirrhosis and splenomegaly seen on CT scan.  Patient's platelet count remains decreased at 51, but stable and approximately her baseline. Her platelet count has ranged as low as 36 and as high as 81 since December 2015. Previously, the remainder of her laboratory work was either negative or within normal limits. No further intervention is needed at this time.  Return to clinic in 3 months for further evaluation. 2.  Iron deficiency anemia: Patient's baseline hemoglobin is approximately 9-10.  Hemoglobin today is 10.2 and her iron stores continue to be within normal limits.  She does not require additional Feraheme today.  Patient last received treatment on August 30, 2017.  Return to clinic in 3 months as above.  3.  Leukopenia: Resolved. 4.  Cirrhosis/ascites: Patient's most recent ultrasound paracentesis was on February 19, 2018.      Patient expressed understanding and was in agreement with this plan. She also understands that She can call clinic at any time with any questions, concerns, or complaints.    [name], MD   06/03/2018 10:01 PM

## 2018-06-05 ENCOUNTER — Inpatient Hospital Stay

## 2018-06-05 ENCOUNTER — Inpatient Hospital Stay: Admitting: Oncology

## 2018-07-01 DEATH — deceased
